# Patient Record
Sex: Male | Born: 1940 | Race: White | Hispanic: No | Marital: Married | Smoking: Current every day smoker
Health system: Western US, Community
[De-identification: ages and names within clinical notes are randomized; demographics above are authoritative.]

## PROBLEM LIST (undated history)

## (undated) DIAGNOSIS — H9190 Unspecified hearing loss, unspecified ear: Secondary | ICD-10-CM

## (undated) DIAGNOSIS — J449 Chronic obstructive pulmonary disease, unspecified: Secondary | ICD-10-CM

## (undated) HISTORY — PX: HERNIA REPAIR: SHX51

## (undated) HISTORY — PX: APPENDECTOMY: SHX54

## (undated) LAB — PREPARE RBC
Blood Product Blood Type Barcode: 5100
Blood Product Blood Type Barcode: 5100
Blood Product Blood Type Barcode: 5100
Blood Product Blood Type Barcode: 5100
Blood Product Unit ABORh: O POS
Blood Product Unit ABORh: O POS
Blood Product Unit ABORh: O POS
Blood Product Unit ABORh: O POS
Blood Product Unit Expiration: 201712142359
Blood Product Unit Expiration: 201712142359
Blood Product Unit Expiration: 201712202359
Blood Product Unit Expiration: 201712262359
Blood Product Unit Rh: POSITIVE
Blood Product Unit Rh: POSITIVE
Blood Product Unit Rh: POSITIVE
Blood Product Unit Rh: POSITIVE

---

## 2009-04-22 LAB — CBC WITHOUT DIFFERENTIAL
HCT: 54.1 % — ABNORMAL HIGH (ref 42.0–54.0)
Hgb: 18.2 gm/dL — ABNORMAL HIGH (ref 14.0–18.0)
MCH: 33.3 pg (ref 27.0–34.0)
MCHC: 33.7 gm/dL (ref 32.0–36.0)
MCV: 98.7 fL (ref 81.0–99.0)
MPV: 9.3 fL (ref 7.4–10.4)
Platelet Count: 131 10*3/uL — ABNORMAL LOW (ref 150–400)
RBC: 5.48 10*6/uL (ref 4.70–6.10)
RDW: 13.1 % (ref 11.5–14.5)
WBC: 15.5 10*3/uL — ABNORMAL HIGH (ref 4.8–10.8)

## 2009-04-22 LAB — BLOOD CULTURE
Culture Result: NO GROWTH
Culture Result: NO GROWTH
Report Status: 1092011
Report Status: 1092011

## 2009-04-22 LAB — COMPREHENSIVE METABOLIC PANEL
ALT - Alanine Amino transferase: 14 IU/L (ref 5–35)
AST - Aspartate Aminotransferase: 20 IU/L (ref 5–37)
Albumin/Globulin Ratio: 1.4 (ref >0.9)
Albumin: 4.2 gm/dL (ref 3.5–5.5)
Alkaline Phosphatase: 72 IU/L (ref 39–117)
Anion Gap: 15.3 mmol/L (ref 5–19)
BUN: 23 mg/dL (ref 6–26)
Bilirubin, Total: 2.7 mg/dL — ABNORMAL HIGH (ref 0.2–1.2)
CO2 - Carbon Dioxide: 23.1 mmol/L (ref 22–34)
Calcium: 8.9 mg/dL (ref 8.5–10.5)
Chloride: 99 mmol/L (ref 99–111)
Creatinine, Serum: 1.07 mg/dL (ref 0.60–1.30)
GFR Estimate: >60 mL/min/{1.73_m2} (ref >60)
Globulin: 2.9 gm/dL (ref 2.2–3.7)
Glucose: 107 mg/dL — ABNORMAL HIGH (ref 80–99)
Potassium: 3.4 mmol/L — ABNORMAL LOW (ref 3.5–5.3)
Protein, Total: 7.1 gm/dL (ref 6.0–8.0)
Sodium: 134 mmol/L — ABNORMAL LOW (ref 136–148)

## 2012-01-25 ENCOUNTER — Inpatient Hospital Stay: Payer: MEDICARE | Attending: MD | Primary: MD

## 2012-01-25 ENCOUNTER — Inpatient Hospital Stay: Admit: 2012-01-25 | Payer: MEDICARE | Attending: MD | Primary: MD

## 2012-01-25 DIAGNOSIS — M5137 Other intervertebral disc degeneration, lumbosacral region: Secondary | ICD-10-CM

## 2012-01-25 DIAGNOSIS — M5430 Sciatica, unspecified side: Secondary | ICD-10-CM

## 2012-05-11 ENCOUNTER — Inpatient Hospital Stay: Admit: 2012-05-11 | Payer: MEDICARE | Attending: MD | Primary: MD

## 2012-05-11 DIAGNOSIS — M5430 Sciatica, unspecified side: Secondary | ICD-10-CM

## 2013-05-16 ENCOUNTER — Emergency Department: Admit: 2013-05-17 | Payer: MEDICARE | Primary: MD

## 2013-05-16 DIAGNOSIS — M549 Dorsalgia, unspecified: Secondary | ICD-10-CM

## 2013-05-16 NOTE — ED Notes (Signed)
Pt informed of plan to inform md lab tests are back. Pt still pain free at this time.

## 2013-05-16 NOTE — ED Notes (Signed)
IV established, blood to lab, pt awaiting to be seen by MD

## 2013-05-16 NOTE — ED Provider Notes (Signed)
[facility] EMERGENCY DEPARTMENT PROVIDER NOTE      Name: Eduardo Khan  DOB: 06-09-1940 73 y.o.  MRN: [id number]  CSN: [id number]      HISTORY:     CHIEF COMPLAINT    Chief Complaint   Patient presents with   . Back Pain   . Numbness         HPI    Eduardo Khan is a 73 y.o. male with past medical history significant for hypertension, A. fib on Coumadin, continued tobacco abuse and COPD who presents with back pain.    The patient states that he was otherwise in his normal healthy baseline this evening. He was sitting watching TV when he had abrupt onset of subscapular left chest pain which was sharp in nature. It lasted approximately 15 minutes. He had associated radiation of numbness down his left arm. He did not have any associated shortness of breath, nausea, vomiting or diaphoresis. The pain went away after approximately 15 minutes without any intervention. This event occurred at 7:30 tonight. He has not had any recurrence of the symptoms. Here he feels normal.    Otherwise reports being at their normal baseline with no recent fevers, chills, coughs or colds.      PAST MEDICAL HISTORY    Past Medical History   Diagnosis Date   . A-fib    . COPD (chronic obstructive pulmonary disease)    . GERD (gastroesophageal reflux disease)    . Hypertension          SURGICAL HISTORY    History reviewed. No pertinent past surgical history.      CURRENT MEDICATIONS    Current Outpatient Rx   Name  Route  Sig  Dispense  Refill   . fluticasone-salmeterol (ADVAIR) 100-50 mcg/dose diskus inhaler    Inhalation    Inhale 1 puff into the lungs 2 (two) times daily.             Marland Kitchen ipratropium-albuterol (DUO-NEB) 0.5 mg-3 mg(2.5 mg base)/3 mL nebulizer solution    Nebulization    Take 3 mLs by nebulization every 6 (six) hours as needed for Wheezing.             . nadolol (CORGARD) 40 MG tablet    Oral    Take 40 mg by mouth daily.             Marland Kitchen OMEPRAZOLE ORAL    Oral    Take by mouth.             . potassium chloride  (KLOR-CON) 10 MEQ CR tablet    Oral    Take 10 mEq by mouth daily.             Marland Kitchen tiotropium (SPIRIVA) 18 mcg inhalation    Inhalation    Inhale 18 mcg into the lungs daily.             Marland Kitchen UNKNOWN TO PATIENT        Pt states  He takes water pill, unknown name.             . warfarin (COUMADIN) 5 MG tablet    Oral    Take 5 mg by mouth daily. 25mg  every other day                   ALLERGIES    Codeine and Morphine      FAMILY HISTORY    Reviewed and felt noncontributory the patient's current presentation.  SOCIAL HISTORY    History   Substance Use Topics   . Smoking status: Current Every Day Smoker -- 1.00 packs/day   . Smokeless tobacco: Not on file   . Alcohol Use: 0.6 oz/week     1 Cans of beer per week      Comment: occasion         REVIEW OF SYSTEMS:   10-pt Review of Systems was performed and is otherwise negative apart from that mentioned in HPI.      PHYSICAL EXAM:   VITAL SIGNS:  BP 113/88  Pulse 82  Temp(Src) 36.1 ?C (97 ?F) (Temporal)  Resp 18  SpO2 94%  Constitutional:  Well developed, Well nourished, No acute distress, Non-toxic appearance.   HENT:  Normocephalic, atraumatic.    Neck: Supple. Normal range of motion.    Eyes:  PERRL, EOMI, Conjunctiva normal, No discharge.   Respiratory:  Normal work of breathing. Normal breath sounds throughout. No wheezing, rhonchi or rales.   Cardiovascular:  Irregularly irregular rate and rhythm. No murmur, rubs, or gallop.   GI:  Abdomen is soft and non-distended. Non-tender. No rebound. No guarding.   Musculoskeletal:  Intact distal pulses. No tenderness. No edema.   Integument:  Warm. Dry. No erythema. No rash.   Neurologic:  Alert & oriented x 3, Normal motor function. Normal sensory function. No focal deficits noted.  Psychiatric:  Affect normal, Judgment normal, Mood normal.       ED COURSE & MEDICAL DECISION MAKING    In summary, Eduardo Khan is a 73 y.o. male with past medical history significant for hypertension, A. fib, continued tobacco abuse  and COPD who presents with back pain.    Our primary and secondary assessment reveals an awake, alert patient in no acute distress. Hemodynamically stable and afebrile. Exam reveals no focal findings.    Given the patient's history and exam, it is uncertain what caused his subscapular back pain earlier tonight. My initial concern was could this be cardiac in nature. His initial EKG was unremarkable. His initial troponin was also negative. He is having no hypoxia, tachypnea or other symptoms suggestive of pulmonary embolism. His history is also not suggestive of aortic dissection. A chest x-ray was obtained and shows no evidence of pneumonia, pneumothorax or other acute cardiopulmonary process. This does not appear to be a referred intra-abdominal pathology. The patient was asymptomatic on arrival and remained asymptomatic throughout his ED course. I elected to hold him here for a repeat troponin 4 hours after the symptoms resolved. That was again negative.    Given the patient's workup, feel he is safe for discharge. Have discussed with the patient results of workup, indications for return and need for PCP follow up. They understand and agree with the plan.       Labs:  All labs were reviewed prior to patient's disposition and notable for the following:  CBC unremarkable  CMP notable for a mild hypokalemia  Lipase negative  Troponin negative  Delta troponin negative    Imaging:  CXR: Obtained, reviewed and interpreted by myself shows no evidence of infiltrates, effusions or pneumothorax. Cardiac and mediastinal silhouette normal. No bony or soft tissue abnormalities.        Further Diagnostics:  12 Lead ECG Interpretation:  Rhythm:  Atrial fibrillation  Rate:  82  Axis:  Normal  QT interval:  406  ST segment changes:  No acute ischemic changes      IMPRESSION:  Subscapular back pain  PLAN, DISPOSITION AND FOLLOW-UP:   Discharged in stable condition  Followup PCP  Return precautions given        [name],  MD  05/17/13 [phone]

## 2013-05-16 NOTE — ED Notes (Signed)
Laying in bed when he had sudden subscapular pain on left, then arm went numb. Lasted 10-15 minutes , almost resolved now. HX COPD with peripheral cyanosis.

## 2013-05-17 ENCOUNTER — Inpatient Hospital Stay
Admit: 2013-05-17 | Discharge: 2013-05-17 | Disposition: A | Discharge status: Home / Self Care | Payer: MEDICARE | Attending: MD

## 2013-05-17 LAB — COMPREHENSIVE METABOLIC PANEL
ALT - Alanine Amino transferase: 21 IU/L (ref 5–35)
AST - Aspartate Aminotransferase: 22 IU/L (ref 5–37)
Albumin/Globulin Ratio: 1.5 (ref >0.9)
Albumin: 4.3 gm/dL (ref 3.5–5.5)
Alkaline Phosphatase: 83 IU/L (ref 39–117)
Anion Gap: 8.7 mmol/L (ref 3–11)
BUN: 20 mg/dL (ref 6–26)
Bilirubin, Total: 0.8 mg/dL (ref 0.2–1.2)
CO2 - Carbon Dioxide: 26.3 mmol/L (ref 22–34)
Calcium: 9 mg/dL (ref 8.5–10.5)
Chloride: 104 mmol/L (ref 99–111)
Creatinine, Serum: 0.9 mg/dL (ref 0.60–1.30)
GFR Estimate: >60 mL/min/{1.73_m2} (ref >60)
Globulin: 2.8 gm/dL (ref 2.2–3.7)
Glucose: 110 mg/dL — ABNORMAL HIGH (ref 80–99)
Potassium: 3.1 mmol/L — ABNORMAL LOW (ref 3.5–5.3)
Protein, Total: 7.1 gm/dL (ref 6.0–8.0)
Sodium: 139 mmol/L (ref 136–148)

## 2013-05-17 LAB — CBC WITH AUTO DIFFERENTIAL
Basophils %: 0 % (ref 0–2)
Basophils, Absolute: 0 10*3/uL (ref 0–0.2)
Eosinophils %: 3 % (ref 0–7)
Eosinophils, Absolute: 0.2 10*3/uL (ref 0–0.7)
HCT: 54.2 % — ABNORMAL HIGH (ref 42.0–54.0)
Hgb: 18.1 gm/dL — ABNORMAL HIGH (ref 14.0–18.0)
Lymphocytes %: 29 % (ref 25–45)
Lymphocytes, Absolute: 2.2 10*3/uL (ref 1.1–4.3)
MCH: 33.3 pg (ref 27.0–34.0)
MCHC: 33.5 gm/dL (ref 32.0–36.0)
MCV: 99.4 fL — ABNORMAL HIGH (ref 81.0–99.0)
MPV: 9 fL (ref 7.4–10.4)
Monocytes %: 8 % (ref 0–12)
Monocytes, Absolute: 0.6 10*3/uL (ref 0–1.2)
Neutrophils, Absolute: 4.5 10*3/uL (ref 1.6–7.3)
Platelet Count: 138 10*3/uL — ABNORMAL LOW (ref 150–400)
RBC: 5.45 10*6/uL (ref 4.70–6.10)
RDW: 13.6 % (ref 11.5–14.5)
Segs (Neutrophils)%: 60 % (ref 35–70)
WBC: 7.6 10*3/uL (ref 4.8–10.8)

## 2013-05-17 LAB — PROTIME-INR
INR: 2.3 — ABNORMAL HIGH (ref 0.9–1.1)
Protime: 28 s — ABNORMAL HIGH (ref 9.9–13.4)

## 2013-05-17 LAB — TROPONIN I
Troponin I: <0.02 ng/mL (ref 0.0–0.04)
Troponin I: <0.02 ng/mL (ref 0.0–0.04)

## 2013-05-17 LAB — LIPASE: Lipase: 24 U/L (ref 15–50)

## 2013-05-17 MED ORDER — aspirin 81 MG chewable tablet 324 mg
81 | Freq: Once | ORAL | Status: AC
Start: 2013-05-17 — End: 2013-05-16
  Administered 2013-05-17: 08:00:00 81 mg via ORAL

## 2013-05-17 MED FILL — ASPIRIN 81 MG CHEWABLE TABLET: 81 mg | ORAL | Qty: 4

## 2013-05-17 NOTE — ED Notes (Signed)
Pt resting in gurney watching tv with wife, awaiting 2nd trop draw at 0130

## 2013-05-17 NOTE — ED Notes (Signed)
Pt ambulated to restroom and back to room, pt aware of plan to repeat trop at 0130.

## 2013-05-17 NOTE — Discharge Instructions (Signed)
Thanks for coming to [facility] Emergency Department today. You were seen and evaluated for back pain. Please follow the below recommendations:    - Your chest xray, blood work and EKG did not reveal an evident cause of your back pain.    -  Although the specific cause of your back pain is not certain, it does not appear to be caused by your heart. That being said, sometimes serious causes of pain can present in unusual ways or can take time to develop.     - Please return to the ED if the pain changes - gets worse, spreads to your shoulders, jaw or neck or if you have other symptoms such as difficulty breathing, dizziness, sweating or you feel worse in any way. Otherwise, it is very important to be rechecked by the follow-up provider as we discussed.       Thank you.  Dorethea Clan, MD

## 2015-07-06 ENCOUNTER — Inpatient Hospital Stay
Admission: EM | Admit: 2015-07-06 | Discharge: 2015-07-09 | Disposition: A | Discharge status: Home / Self Care | Payer: MEDICARE | Admitting: MD

## 2015-07-06 ENCOUNTER — Emergency Department: Admit: 2015-07-06 | Payer: MEDICARE | Primary: MD

## 2015-07-06 DIAGNOSIS — J441 Chronic obstructive pulmonary disease with (acute) exacerbation: Principal | ICD-10-CM

## 2015-07-06 LAB — PT & PTT
APTT: 26 Seconds (ref 23–36)
INR: 1.3 — ABNORMAL HIGH (ref 0.9–1.2)
Protime: 15.6 Seconds — ABNORMAL HIGH (ref 9.9–13.4)

## 2015-07-06 LAB — COMPREHENSIVE METABOLIC PANEL
ALT - Alanine Aminotransferase: 20 IU/L (ref 7–52)
AST - Aspartate Aminotransferase: 28 IU/L (ref 10–50)
Albumin/Globulin Ratio: 1.7 (ref >0.9)
Albumin: 4.2 g/dL (ref 3.5–5.0)
Alkaline Phosphatase: 83 IU/L (ref 34–104)
Anion Gap: 10 mmol/L (ref 3.0–11.0)
BUN: 26 mg/dL — ABNORMAL HIGH (ref 6–23)
Bilirubin Total: 1 mg/dL (ref 0.3–1.2)
CO2 - Carbon Dioxide: 26 mmol/L (ref 21.0–31.0)
Calcium: 8.8 mg/dL (ref 8.6–10.3)
Chloride: 100 mmol/L (ref 98–111)
Creatinine: 0.89 mg/dL (ref 0.65–1.30)
GFR Estimate: >60 mL/min/{1.73_m2} (ref >=60)
Globulin: 2.5 g/dL (ref 2.2–3.7)
Glucose: 116 mg/dL — ABNORMAL HIGH (ref 80–99)
Potassium: 3.4 mmol/L — ABNORMAL LOW (ref 3.5–5.1)
Protein Total: 6.7 g/dL (ref 6.0–8.0)
Sodium: 136 mmol/L (ref 135–143)

## 2015-07-06 LAB — IRON AND TIBC
Iron Saturation: 7 % — ABNORMAL LOW (ref 18–54)
Iron: 22 ug/dL — ABNORMAL LOW (ref 40–160)
Total Iron Binding Capacity: 313 ug/dL (ref 261–471)
Transferrin: 219 mg/dL (ref 200–362)

## 2015-07-06 LAB — UA WITH AUTO MICRO
Ascorbic Acid, Urine: NEGATIVE
Bacteria, Urine: NONE SEEN /HPF
Bilirubin, Urine: NEGATIVE
Blood, Urine: NEGATIVE
Glucose, Urine: NEGATIVE mg/dL
Leukocyte Esterase, Urine: NEGATIVE
Nitrite, Urine: NEGATIVE
Protein, Urine: NEGATIVE mg/dL
RBC, Urine: <1 /HPF (ref <=5)
Specific Gravity, Urine: 1.019 (ref 1.003–1.030)
Squamous Epithelial, Urine: 0 /HPF (ref <=5)
Urobilinogen, Urine: NORMAL mg/dL
White Blood Cell Microscopic Numeric: <1 /HPF (ref <=9)
pH, UA: 5 (ref 5.0–7.5)

## 2015-07-06 LAB — CBC WITH AUTO DIFFERENTIAL
Basophils %: 1 % (ref 0–2)
Basophils, Absolute: 0 10*3/ÂµL (ref 0.0–0.2)
Eosinophils %: 0 % (ref 0–7)
Eosinophils, Absolute: 0 10*3/ÂµL (ref 0.0–0.7)
HCT: 53.4 % (ref 42.0–54.0)
Hemoglobin: 18.4 g/dL — ABNORMAL HIGH (ref 12.0–18.0)
Lymphocytes %: 9 % — ABNORMAL LOW (ref 25–45)
Lymphocytes, Absolute: 0.5 10*3/ÂµL — ABNORMAL LOW (ref 1.1–4.3)
MCH: 34.5 pg — ABNORMAL HIGH (ref 27.0–34.0)
MCHC: 34.5 g/dL (ref 32.0–36.0)
MCV: 100.1 fL — ABNORMAL HIGH (ref 81.0–99.0)
MPV: 9.4 fL (ref 7.4–10.4)
Monocytes %: 9 % (ref 0–12)
Monocytes, Absolute: 0.4 10*3/ÂµL (ref 0.0–1.2)
Neutrophils %: 81 % — ABNORMAL HIGH (ref 35–70)
Neutrophils, Absolute: 3.9 10*3/ÂµL (ref 1.6–7.3)
Platelet Count: 78 10*3/ÂµL — ABNORMAL LOW (ref 150–400)
RBC: 5.34 10*6/ÂµL (ref 4.70–6.10)
RDW: 13.8 % (ref 11.5–14.5)
WBC: 4.8 10*3/ÂµL (ref 4.8–10.8)

## 2015-07-06 LAB — BLOOD CULTURE
Blood Culture Result: NO GROWTH
Blood Culture Result: NO GROWTH

## 2015-07-06 LAB — LACTIC ACID ED: Lactic Acid ED: 2.1 mmol/L (ref 0.5–2.2)

## 2015-07-06 MED ORDER — sodium chloride (NS) 0.9% bolus 250 mL
Freq: Once | INTRAVENOUS | Status: AC
Start: 2015-07-06 — End: 2015-07-06
  Administered 2015-07-06 – 2015-07-07 (×2): via INTRAVENOUS

## 2015-07-06 MED ORDER — sodium chloride 0.9 % (NS) syringe 10 mL
INTRAMUSCULAR | Status: DC | PRN
Start: 2015-07-06 — End: 2015-07-09

## 2015-07-06 MED ORDER — azithromycin (ZITHROMAX) 500 mg in sodium chloride 0.9 % (NS) 250 mL IVPB
500 | Freq: Once | INTRAVENOUS | Status: AC
Start: 2015-07-06 — End: 2015-07-06
  Administered 2015-07-07 (×2): via INTRAVENOUS

## 2015-07-06 MED ORDER — acetaminophen (TYLENOL) tablet 650 mg
325 | Freq: Once | ORAL | Status: AC
Start: 2015-07-06 — End: 2015-07-06
  Administered 2015-07-06: 23:00:00 325 mg via ORAL

## 2015-07-06 MED ORDER — cefTRIAXone (ROCEPHIN) 1 g in sodium chloride 0.9 % (NS) 100 mL IVPB
1 | Freq: Once | INTRAMUSCULAR | Status: AC
Start: 2015-07-06 — End: 2015-07-06
  Administered 2015-07-07 (×2): via INTRAVENOUS

## 2015-07-06 MED FILL — SODIUM CHLORIDE 0.9 % INTRAVENOUS SOLUTION: 250.0000 mL | INTRAVENOUS | Qty: 250

## 2015-07-06 MED FILL — ACETAMINOPHEN 325 MG TABLET: 325 mg | ORAL | Qty: 2

## 2015-07-06 MED FILL — CEFTRIAXONE 1 GRAM SOLUTION FOR INJECTION: 1 g | INTRAMUSCULAR | Qty: 1

## 2015-07-06 NOTE — H&P (Signed)
History and Physical      Name: Eduardo Khan.  DOB: October 04, 1940 75 y.o.  MRN: [id number]  CSN: [id number]    History:     Chief Complaint:  Fatigue increasing over the last 3 - 4 days.    History of Present Illness:  Eduardo Khan. is a 75 y.o. male who presents for evaluation of urinary frequency, fatigue. Symptoms have been moderate. The symptoms have occurred over the last 1 days, and occur all day, lasting all day. The symptoms are aggravated by nothing and relieved by nothing.    Over the last 6 months pt has been losing weight, early satiety, fatigue. Over the last 3 days that fatigue has gotten worse and his chronic tremor has gotten worse as he has gotten weaker. Today he came in to the ED for frequent urination and was found to have a fever and what looks like a right middle lobe pneumonia. White count is normal and he has a mild polycythemia that looks chronic. His platelets are low.     Past Medical History:  Past Medical History:   Diagnosis Date   . A-fib (CMS/HCC)    . COPD (chronic obstructive pulmonary disease) (CMS/HCC)    . GERD (gastroesophageal reflux disease)    . Hypertension      Past Surgical History:  History reviewed. No pertinent surgical history.  Current Medications:  Prior to Admission Medications    Medication Dose & Frequency   fluticasone-salmeterol (ADVAIR) 100-50 mcg/dose diskus inhaler Inhale 1 puff into the lungs 2 (two) times daily.   ipratropium-albuterol (DUO-NEB) 0.5 mg-3 mg(2.5 mg base)/3 mL nebulizer solution Take 3 mLs by nebulization every 6 (six) hours as needed for Wheezing.   nadolol (CORGARD) 40 MG tablet Take 40 mg by mouth daily.   OMEPRAZOLE ORAL Take by mouth.   potassium chloride (KLOR-CON) 10 MEQ CR tablet Take 10 mEq by mouth daily.   tiotropium (SPIRIVA) 18 mcg inhalation Inhale 18 mcg into the lungs daily.   UNKNOWN TO PATIENT Pt states  He takes water pill, unknown name.   warfarin (COUMADIN) 5 MG tablet Take 5 mg by mouth daily. 25mg  every  other day        Allergies:  Venom-honey bee; Codeine; and Morphine    Family History:  History reviewed. No pertinent family history.  Social History:  Social History   Substance Use Topics   . Smoking status: Current Every Day Smoker     Packs/day: 1.00   . Smokeless tobacco: None   . Alcohol use 0.6 oz/week     1 Cans of beer per week      Comment: occasion     Immunization:    There is no immunization history on file for this patient.  Review of Systems:  Review of Systems   Constitutional: Positive for fever, malaise/fatigue and weight loss.   HENT: Negative.    Eyes: Negative.    Respiratory: Positive for shortness of breath and wheezing.    Cardiovascular: Negative.    Gastrointestinal: Negative.    Genitourinary: Positive for frequency.   Musculoskeletal: Negative.    Skin: Positive for rash.   Neurological: Positive for weakness.   Endo/Heme/Allergies: Negative.    Psychiatric/Behavioral: Negative.        Physical Exam:     Vital Signs:  Initial Vitals   BP 07/06/15 1706 106/58   Pulse 07/06/15 1611 79   Resp 07/06/15 1611 16   Temp 07/06/15 1554 37.6 ?  C (99.6 ?F)   SpO2 07/06/15 1611 92 %     Exam:  Physical Exam   Constitutional: He appears well-developed and well-nourished. No distress.   HENT:   Head: Normocephalic and atraumatic.   Mouth/Throat: Oropharynx is clear and moist.   Eyes: Conjunctivae are normal. Pupils are equal, round, and reactive to light. No scleral icterus.   Cardiovascular: Normal rate, normal heart sounds and intact distal pulses.    No murmur heard.  Pulmonary/Chest: Effort normal. No respiratory distress. He has rales.   Abdominal: Soft. Bowel sounds are normal. He exhibits no distension. There is no tenderness.   Musculoskeletal: Normal range of motion.   Neurological: He is alert.   Skin: Skin is warm and dry. He is not diaphoretic.   Psychiatric: He has a normal mood and affect. His behavior is normal.   Nursing note and vitals reviewed.      Data:     Labs:  Results for orders  placed or performed during the hospital encounter of 07/06/15 (from the past 24 hour(s))   Urinalysis with Microscopic -Once    Collection Time: 07/06/15  4:03 PM   Result Value Ref Range    Color, Urine Yellow Yellow, Straw    Clarity, Urine Clear Clear    Glucose, Urine Negative Negative mg/dL    Bilirubin, Urine Negative Negative    Ketones, Urine 1+ (A) Negative mg/dL    Specific Gravity, Urine 1.019 1.003 - 1.030    Blood, Urine Negative Negative    pH, UA 5.0 5.0 - 7.5    Protein, Urine Negative Negative mg/dL    Urobilinogen, Urine Normal Normal mg/dL    Nitrite, Urine Negative Negative    Leukocyte Esterase, Urine Negative Negative    Ascorbic Acid, Urine Negative Negative    White Blood Cell Microscopic Numeric <1 <=9 /HPF    RBC, Urine <1 <=5 /HPF    Bacteria, Urine None Seen None Seen /HPF    Squamous Epithelial, Urine 0 <=5 /HPF    Mucous, Urine 1+ 0 /LPF   Blood Culture -x 2    Collection Time: 07/06/15  4:16 PM   Result Value Ref Range    Blood Culture Result No Growth to Date    Blood Culture -x 2    Collection Time: 07/06/15  4:16 PM   Result Value Ref Range    Blood Culture Result No Growth to Date    CBC with Auto Differential -STAT    Collection Time: 07/06/15  4:16 PM   Result Value Ref Range    WBC 4.8 4.8 - 10.8 10*3/?L    RBC 5.34 4.70 - 6.10 10*6/?L    Hemoglobin 18.4 (H) 12.0 - 18.0 g/dL    HCT 16.1 09.6 - 04.5 %    MCV 100.1 (H) 81.0 - 99.0 fL    MCH 34.5 (H) 27.0 - 34.0 pg    MCHC 34.5 32.0 - 36.0 g/dL    RDW 40.9 81.1 - 91.4 %    Platelet Count 78 (L) 150 - 400 10*3/?L    MPV 9.4 7.4 - 10.4 fL    Neutrophils % 81 (H) 35 - 70 %    Lymphocytes % 9 (L) 25 - 45 %    Monocytes % 9 0 - 12 %    Eosinophils % 0 0 - 7 %    Basophils % 1 0 - 2 %    Neutrophils, Absolute 3.9 1.6 - 7.3 10*3/?L    Lymphocytes,  Absolute 0.5 (L) 1.1 - 4.3 10*3/?L    Monocytes, Absolute 0.4 0.0 - 1.2 10*3/?L    Eosinophils, Absolute 0.0 0.0 - 0.7 10*3/?L    Basophils, Absolute 0.0 0.0 - 0.2 10*3/?L   Comprehensive  Metabolic Panel -STAT    Collection Time: 07/06/15  4:16 PM   Result Value Ref Range    Sodium 136 135 - 143 mmol/L    Potassium 3.4 (L) 3.5 - 5.1 mmol/L    Chloride 100 98 - 111 mmol/L    CO2 - Carbon Dioxide 26.0 21.0 - 31.0 mmol/L    Glucose 116 (H) 80 - 99 mg/dL    BUN 26 (H) 6 - 23 mg/dL    Creatinine 1.61 0.96 - 1.30 mg/dL    Calcium 8.8 8.6 - 04.5 mg/dL    AST - Aspartate Aminotransferase 28 10 - 50 IU/L    ALT - Alanine Amino transferase 20 7 - 52 IU/L    Alkaline Phosphatase 83 34 - 104 IU/L    Bilirubin Total 1.0 0.3 - 1.2 mg/dL    Protein Total 6.7 6.0 - 8.0 g/dL    Albumin 4.2 3.5 - 5.0 g/dL    Globulin 2.5 2.2 - 3.7 g/dL    Albumin/Globulin Ratio 1.7 >0.9    Anion Gap 10.0 3.0 - 11.0 mmol/L    GFR Estimate >60 >=60 mL/min/1.3m*2    GFR Additional Info     Lactic Acid (ED) -STAT    Collection Time: 07/06/15  4:16 PM   Result Value Ref Range    Lactic Acid ED 2.1 0.5 - 2.2 mmol/L   PT & PTT -STAT    Collection Time: 07/06/15  4:16 PM   Result Value Ref Range    Protime 15.6 (H) 9.9 - 13.4 Seconds    APTT 26 23 - 36 Seconds    INR 1.3 (H) 0.9 - 1.2          Imaging for last 24 hours:  No results found.    Special Studies:  Chest x-ray    Assessment:     Diagnoses:  Active Hospital Problems    Diagnosis SNOMED CT(R) Date Noted   . Community acquired bacterial pneumonia COMMUNITY ACQUIRED PNEUMONIA 07/06/2015     Priority: High     Class: Acute   . COPD (chronic obstructive pulmonary disease) (CMS/HCC) CHRONIC OBSTRUCTIVE LUNG DISEASE 07/06/2015     Priority: Medium     Class: Chronic   . A-fib (CMS/HCC) ATRIAL FIBRILLATION 07/06/2015     Priority: Low     Class: Chronic   . CAP (community acquired pneumonia) COMMUNITY ACQUIRED PNEUMONIA 07/06/2015       Plan:   Admit, continue antibiotic coverage for community acquired pneumonia, add lovenox until INR is up to therapeutic, continue other home meds for COPD plus RRT meds for PRN nebs.     I will check an abdominal CT to look at the spleen and liver and try  to get a handle on this thrombocytopenia and chronic weight loss. I will also start on prednisone 60/d. Bladder scan was normal. UA was normal so urinary frequency remains mysterious.     I will discuss Mr. Bankson' case with Dr. Mayford Knife who will likely assume care tomorrow morning.     Leanord Asal  07/06/2015  5:30 PM

## 2015-07-06 NOTE — ED Provider Notes (Signed)
History   Alvin Diffee. is a 75 y.o. year old male presenting with Fatigue  .    HPI patient is 75 years old presents with severe fatigue frequency urgency.  This has been occurring over the last 24 hours.  , And the point now where he can't even really sit up let alone stand.  No one has been able to give him adequate help.  Patient has felt chilled today no nausea or vomiting no skin rash.  No headache or sore throat.  No focal neurologic changes just diffuse weakness and fatigue. Patient has a significant fever here 39.1?C.  Does have somewhat of a raspy cough nonproductive.  No abdominal pain no skin rash no headache or focal weakness no confusion. Rest medical history does include atrial fibrillation. And he is on anticoagulation for the.    Past Medical History:   Diagnosis Date   . A-fib    . COPD (chronic obstructive pulmonary disease)    . GERD (gastroesophageal reflux disease)    . Hypertension        No past surgical history on file.    No family history on file.    Social History   Substance Use Topics   . Smoking status: Current Every Day Smoker     Packs/day: 1.00   . Smokeless tobacco: Not on file   . Alcohol use 0.6 oz/week     1 Cans of beer per week      Comment: occasion     Other Social History Comments:       Patient's Medications   New Prescriptions    No medications on file   Home Medications Prior to ED Visit    FLUTICASONE-SALMETEROL (ADVAIR) 100-50 MCG/DOSE DISKUS INHALER    Inhale 1 puff into the lungs 2 (two) times daily.    IPRATROPIUM-ALBUTEROL (DUO-NEB) 0.5 MG-3 MG(2.5 MG BASE)/3 ML NEBULIZER SOLUTION    Take 3 mLs by nebulization every 6 (six) hours as needed for Wheezing.    NADOLOL (CORGARD) 40 MG TABLET    Take 40 mg by mouth daily.    OMEPRAZOLE ORAL    Take by mouth.    POTASSIUM CHLORIDE (KLOR-CON) 10 MEQ CR TABLET    Take 10 mEq by mouth daily.    TIOTROPIUM (SPIRIVA) 18 MCG INHALATION    Inhale 18 mcg into the lungs daily.    UNKNOWN TO PATIENT    Pt states  He  takes water pill, unknown name.    WARFARIN (COUMADIN) 5 MG TABLET    Take 5 mg by mouth daily. 25mg  every other day   Modified Medications    No medications on file   Discontinued Medications    No medications on file       Review of Systems full review of systems is obtained pertinent positives as in history of present illness    Physical Exam     Visit Vitals   . Pulse 79   . Temp (!) 39.1 ?C (102.4 ?F) (Oral)   . Resp 16   . SpO2 92%       Physical Exam alert oriented has a slight chronic tremor very conversant and pleasant but does appear ill  HEENT is negative oropharynx without signs of infection  Neck is supple there is no stiffness or difficulties  Lungs clear no wheezes or rales  Cardiovascular regular rate and rhythm no abnormalities  Abdomen is soft there is no tenderness  Lower extremities without significant  edema  In is without rash  Mentation intact he is conversant speech is fluent    ED Course     Results for orders placed or performed during the hospital encounter of 07/06/15 (from the past 24 hour(s))   Urinalysis with Microscopic -Once    Collection Time: 07/06/15  4:03 PM   Result Value Ref Range    Color, Urine Yellow Yellow, Straw    Clarity, Urine Clear Clear    Glucose, Urine Negative Negative mg/dL    Bilirubin, Urine Negative Negative    Ketones, Urine 1+ (A) Negative mg/dL    Specific Gravity, Urine 1.019 1.003 - 1.030    Blood, Urine Negative Negative    pH, UA 5.0 5.0 - 7.5    Protein, Urine Negative Negative mg/dL    Urobilinogen, Urine Normal Normal mg/dL    Nitrite, Urine Negative Negative    Leukocyte Esterase, Urine Negative Negative    Ascorbic Acid, Urine Negative Negative    White Blood Cell Microscopic Numeric <1 <=9 /HPF    RBC, Urine <1 <=5 /HPF    Bacteria, Urine None Seen None Seen /HPF    Squamous Epithelial, Urine 0 <=5 /HPF    Mucous, Urine 1+ 0 /LPF   Blood Culture -x 2    Collection Time: 07/06/15  4:16 PM   Result Value Ref Range    Blood Culture Result No Growth to  Date    Blood Culture -x 2    Collection Time: 07/06/15  4:16 PM   Result Value Ref Range    Blood Culture Result No Growth to Date    Comprehensive Metabolic Panel -STAT    Collection Time: 07/06/15  4:16 PM   Result Value Ref Range    Sodium 136 135 - 143 mmol/L    Potassium 3.4 (L) 3.5 - 5.1 mmol/L    Chloride 100 98 - 111 mmol/L    CO2 - Carbon Dioxide 26.0 21.0 - 31.0 mmol/L    Glucose 116 (H) 80 - 99 mg/dL    BUN 26 (H) 6 - 23 mg/dL    Creatinine 1.61 0.96 - 1.30 mg/dL    Calcium 8.8 8.6 - 04.5 mg/dL    AST - Aspartate Aminotransferase 28 10 - 50 IU/L    ALT - Alanine Amino transferase 20 7 - 52 IU/L    Alkaline Phosphatase 83 34 - 104 IU/L    Bilirubin Total 1.0 0.3 - 1.2 mg/dL    Protein Total 6.7 6.0 - 8.0 g/dL    Albumin 4.2 3.5 - 5.0 g/dL    Globulin 2.5 2.2 - 3.7 g/dL    Albumin/Globulin Ratio 1.7 >0.9    Anion Gap 10.0 3.0 - 11.0 mmol/L    GFR Estimate >60 >=60 mL/min/1.44m*2    GFR Additional Info     Lactic Acid (ED) -STAT    Collection Time: 07/06/15  4:16 PM   Result Value Ref Range    Lactic Acid ED 2.1 0.5 - 2.2 mmol/L   PT & PTT -STAT    Collection Time: 07/06/15  4:16 PM   Result Value Ref Range    Protime 15.6 (H) 9.9 - 13.4 Seconds    APTT 26 23 - 36 Seconds    INR 1.3 (H) 0.9 - 1.2       X-ray chest PA or AP    (Results Pending)       Procedures    MDM blood cultures are obtained labs are all as above and  reviewed.  Chest x-ray is consistent with pneumonia and the patient is given ceftriaxone and azithromycin for treatment of community-acquired pneumonia.  He will also be treated for his COPD.  Ongoing treatment for his atrial fibrillation.    ED Disposition: Admit    Diagnoses that have been ruled out:   None   Diagnoses that are still under consideration:   None   Final diagnoses:   None    pneumonia  Fever  Weakness     Kandice Robinsons, MD  07/07/15 [phone]

## 2015-07-06 NOTE — Plan of Care (Signed)
Patient just admitted, will continue to monitor and assess.

## 2015-07-06 NOTE — ED Notes (Signed)
Stable upon transport to the floor. Rocephin IV infused. of ns infused. Zithromax 500mg  is infusing at this time. Vss. And pt is in NAD.

## 2015-07-06 NOTE — ED Notes (Signed)
Bed: 501-01  Expected date:   Expected time:   Means of arrival:   Comments:  59M weakness  howie

## 2015-07-06 NOTE — ED Triage Notes (Signed)
Pt arrives via EMS from home secondary to increased weakness/fatigue with frequent urination. EMS temp 103.8 CBG 121

## 2015-07-07 ENCOUNTER — Inpatient Hospital Stay: Admit: 2015-07-07 | Payer: MEDICARE | Primary: MD

## 2015-07-07 LAB — CBC WITH AUTO DIFFERENTIAL
Basophils %: 0 % (ref 0–2)
Basophils, Absolute: 0 10*3/ÂµL (ref 0.0–0.2)
Eosinophils %: 0 % (ref 0–7)
Eosinophils, Absolute: 0 10*3/ÂµL (ref 0.0–0.7)
HCT: 45 % (ref 42.0–54.0)
Hemoglobin: 15.1 g/dL (ref 12.0–18.0)
Lymphocytes %: 5 % — ABNORMAL LOW (ref 25–45)
Lymphocytes, Absolute: 0.4 10*3/ÂµL — ABNORMAL LOW (ref 1.1–4.3)
MCH: 33.3 pg (ref 27.0–34.0)
MCHC: 33.7 g/dL (ref 32.0–36.0)
MCV: 99.1 fL — ABNORMAL HIGH (ref 81.0–99.0)
MPV: 9.5 fL (ref 7.4–10.4)
Monocytes %: 5 % (ref 0–12)
Monocytes, Absolute: 0.4 10*3/ÂµL (ref 0.0–1.2)
Neutrophils %: 89 % — ABNORMAL HIGH (ref 35–70)
Neutrophils, Absolute: 6.8 10*3/ÂµL (ref 1.6–7.3)
Platelet Count: 57 10*3/ÂµL — ABNORMAL LOW (ref 150–400)
RBC: 4.54 10*6/ÂµL — ABNORMAL LOW (ref 4.70–6.10)
RDW: 13.9 % (ref 11.5–14.5)
WBC: 7.6 10*3/ÂµL (ref 4.8–10.8)

## 2015-07-07 LAB — BASIC METABOLIC PANEL
Anion Gap: 7 mmol/L (ref 3.0–11.0)
BUN: 24 mg/dL — ABNORMAL HIGH (ref 6–23)
CO2 - Carbon Dioxide: 24 mmol/L (ref 21.0–31.0)
Calcium: 7.4 mg/dL — ABNORMAL LOW (ref 8.6–10.3)
Chloride: 107 mmol/L (ref 98–111)
Creatinine: 0.89 mg/dL (ref 0.65–1.30)
GFR Estimate: >60 mL/min/{1.73_m2} (ref >=60)
Glucose: 98 mg/dL (ref 80–99)
Potassium: 3.9 mmol/L (ref 3.5–5.1)
Sodium: 138 mmol/L (ref 135–143)

## 2015-07-07 LAB — PROTIME-INR
INR: 1.5 — ABNORMAL HIGH (ref 0.9–1.2)
Protime: 17.1 Seconds — ABNORMAL HIGH (ref 9.9–13.4)

## 2015-07-07 LAB — B-TYPE NATRIURETIC PEPTIDE: B-Type Natriuretic Peptide - BNP: 304 pg/mL — ABNORMAL HIGH (ref 0–99)

## 2015-07-07 MED ORDER — ketorolac (TORADOL) injection 15 mg
15 | Freq: Four times a day (QID) | INTRAMUSCULAR | Status: DC | PRN
Start: 2015-07-07 — End: 2015-07-09

## 2015-07-07 MED ORDER — L. acidophilus, L. bulgaricus (BACID) tablet 1 tablet
1 | Freq: Two times a day (BID) | ORAL | Status: DC
Start: 2015-07-07 — End: 2015-07-09
  Administered 2015-07-07 – 2015-07-09 (×6): 1 via ORAL

## 2015-07-07 MED ORDER — polyethylene glycol (MIRALAX) packet 17 g
17 | Freq: Every day | ORAL | Status: DC | PRN
Start: 2015-07-07 — End: 2015-07-09

## 2015-07-07 MED ORDER — midodrine (PROAMATINE) tablet 10 mg
2.5 | Freq: Three times a day (TID) | ORAL | Status: DC | PRN
Start: 2015-07-07 — End: 2015-07-09
  Administered 2015-07-07: 10:00:00 2.5 mg via ORAL

## 2015-07-07 MED ORDER — budesonide (PULMICORT) nebulizer suspension 0.5 mg
0.5 | Freq: Two times a day (BID) | RESPIRATORY_TRACT | Status: DC
Start: 2015-07-07 — End: 2015-07-09
  Administered 2015-07-07 – 2015-07-09 (×6): 0.5 mg via RESPIRATORY_TRACT

## 2015-07-07 MED ORDER — ipratropium-albuterol (DUO-NEB) 0.5 mg-3 mg(2.5 mg base)/3 mL nebulizer solution 3 mL
0.5 | RESPIRATORY_TRACT | Status: DC | PRN
Start: 2015-07-07 — End: 2015-07-09

## 2015-07-07 MED ORDER — arformoterol (BROVANA) nebulizer solution 15 mcg
15 | Freq: Two times a day (BID) | RESPIRATORY_TRACT | Status: DC
Start: 2015-07-07 — End: 2015-07-09
  Administered 2015-07-07 – 2015-07-09 (×6): 15 ug via RESPIRATORY_TRACT

## 2015-07-07 MED ORDER — acetaminophen (TYLENOL) tablet 650 mg
325 | ORAL | Status: DC | PRN
Start: 2015-07-07 — End: 2015-07-09
  Administered 2015-07-07: 04:00:00 325 mg via ORAL

## 2015-07-07 MED ORDER — nicotine (NICODERM CQ) 21 mg/24 hr patch 1 patch
21 | Freq: Every day | TRANSDERMAL | Status: DC
Start: 2015-07-07 — End: 2015-07-09
  Administered 2015-07-07 – 2015-07-08 (×3): 21 via TRANSDERMAL

## 2015-07-07 MED ORDER — ondansetron (ZOFRAN) injection 4 mg
4 | Freq: Four times a day (QID) | INTRAMUSCULAR | Status: DC | PRN
Start: 2015-07-07 — End: 2015-07-09

## 2015-07-07 MED ORDER — naloxone (NARCAN) injection 0.08 mg
0.4 | INTRAMUSCULAR | Status: DC | PRN
Start: 2015-07-07 — End: 2015-07-09

## 2015-07-07 MED ORDER — diatrizoate meglumine-sodium (GASTROGRAFIN) 66-10 % oral solution 20 mL
66-10 | Freq: Once | ORAL | Status: AC
Start: 2015-07-07 — End: 2015-07-07
  Administered 2015-07-07: 15:00:00 66-10 mL via ORAL

## 2015-07-07 MED ORDER — HYDROmorphone (DILAUDID) syringe 0.5-1 mg
1 | INTRAMUSCULAR | Status: DC | PRN
Start: 2015-07-07 — End: 2015-07-09

## 2015-07-07 MED ORDER — iohexol (OMNIPAQUE) 350 mg iodine/mL injection 100 mL
350 | Freq: Once | INTRAVENOUS | Status: AC | PRN
Start: 2015-07-07 — End: 2015-07-07
  Administered 2015-07-07: 17:00:00 350 mL via INTRAVENOUS

## 2015-07-07 MED ORDER — predniSONE (DELTASONE) tablet 20 mg
20 | Freq: Every day | ORAL | Status: DC
Start: 2015-07-07 — End: 2015-07-08
  Administered 2015-07-07 – 2015-07-08 (×3): 20 mg via ORAL

## 2015-07-07 MED ORDER — fluticasone-salmeterol (ADVAIR) 100-50 mcg/dose diskus inhaler 1 puff
100-50 | Freq: Two times a day (BID) | RESPIRATORY_TRACT | Status: DC
Start: 2015-07-07 — End: 2015-07-06

## 2015-07-07 MED ORDER — sodium chloride 0.9 % infusion
INTRAVENOUS | Status: DC
Start: 2015-07-07 — End: 2015-07-07

## 2015-07-07 MED ORDER — warfarin (COUMADIN) tablet 5 mg
5 | Freq: Every day | ORAL | Status: DC
Start: 2015-07-07 — End: 2015-07-09
  Administered 2015-07-07 – 2015-07-09 (×3): 5 mg via ORAL

## 2015-07-07 MED ORDER — sodium chloride 0.9 % with KCl 20 mEq/L infusion
20 | INTRAVENOUS | Status: DC
Start: 2015-07-07 — End: 2015-07-08
  Administered 2015-07-07 – 2015-07-08 (×5): 20 mEq/L via INTRAVENOUS

## 2015-07-07 MED ORDER — enoxaparin (LOVENOX) syringe 40 mg
40 | Freq: Every day | SUBCUTANEOUS | Status: DC
Start: 2015-07-07 — End: 2015-07-07
  Administered 2015-07-07: 03:00:00 40 mg via SUBCUTANEOUS

## 2015-07-07 MED ORDER — ipratropium-albuterol (DUO-NEB) 0.5 mg-3 mg(2.5 mg base)/3 mL nebulizer solution 3 mL
0.5 | RESPIRATORY_TRACT | Status: DC
Start: 2015-07-07 — End: 2015-07-07
  Administered 2015-07-07 (×4): 0.5 mL via RESPIRATORY_TRACT

## 2015-07-07 MED ORDER — tiotropium (SPIRIVA) inhalation 18 mcg
18 | Freq: Every day | RESPIRATORY_TRACT | Status: DC
Start: 2015-07-07 — End: 2015-07-06

## 2015-07-07 MED ORDER — sodium chloride (NS) 0.9% bolus 500 mL
Freq: Once | INTRAVENOUS | Status: AC
Start: 2015-07-07 — End: 2015-07-07
  Administered 2015-07-07 (×2): via INTRAVENOUS

## 2015-07-07 MED ORDER — cefTRIAXone (ROCEPHIN) 1 g in D5W IVPB Premix
1 | INTRAVENOUS | Status: DC
Start: 2015-07-07 — End: 2015-07-09
  Administered 2015-07-08: 01:00:00 150 g via INTRAVENOUS
  Administered 2015-07-08: 01:00:00 1 g via INTRAVENOUS
  Administered 2015-07-09: 01:00:00 150 g via INTRAVENOUS
  Administered 2015-07-09: 1 g via INTRAVENOUS

## 2015-07-07 MED ORDER — nitroglycerin (NITROSTAT) SL tablet 0.4 mg
0.4 | SUBLINGUAL | Status: DC | PRN
Start: 2015-07-07 — End: 2015-07-09

## 2015-07-07 MED ORDER — albuterol (PROVENTIL) nebulizer solution 2.5 mg
2.5 | RESPIRATORY_TRACT | Status: DC | PRN
Start: 2015-07-07 — End: 2015-07-07

## 2015-07-07 MED ORDER — potassium chloride SA (K-DUR,KLOR-CON) CR tablet 10 mEq
10 | Freq: Every day | ORAL | Status: DC
Start: 2015-07-07 — End: 2015-07-09
  Administered 2015-07-07 – 2015-07-09 (×4): 10 meq via ORAL

## 2015-07-07 MED ORDER — phenol (CHLORASEPTIC) 1.4 % throat spray 1-2 spray
1.4 | Status: DC | PRN
Start: 2015-07-07 — End: 2015-07-09

## 2015-07-07 MED ORDER — sodium chloride (NS) 0.9% bolus 500 mL
Freq: Once | INTRAVENOUS | Status: AC
Start: 2015-07-07 — End: 2015-07-07
  Administered 2015-07-07 (×2): via INTRAVENOUS

## 2015-07-07 MED ORDER — ciclopirox (LOPROX) 0.77 % cream 15 g
0.77 | Freq: Two times a day (BID) | TOPICAL | Status: DC
Start: 2015-07-07 — End: 2015-07-09
  Administered 2015-07-08 (×2): 0.77 g via TOPICAL

## 2015-07-07 MED ORDER — enoxaparin (LOVENOX) syringe 40 mg
40 | Freq: Every day | SUBCUTANEOUS | Status: DC
Start: 2015-07-07 — End: 2015-07-06

## 2015-07-07 MED ORDER — sodium chloride (NS) 0.9% bolus 1,000 mL
Freq: Once | INTRAVENOUS | Status: AC
Start: 2015-07-07 — End: 2015-07-07
  Administered 2015-07-07 (×2): via INTRAVENOUS

## 2015-07-07 MED ORDER — diatrizoate meglumine-sodium (GASTROGRAFIN) 66-10 % oral solution 10 mL
66-10 | Freq: Once | ORAL | Status: AC
Start: 2015-07-07 — End: 2015-07-06
  Administered 2015-07-07: 03:00:00 66-10 mL via ORAL

## 2015-07-07 MED ORDER — metoprolol succinate (TOPROL-XL) 24 hr tablet 50 mg
50 | Freq: Every day | ORAL | Status: DC
Start: 2015-07-07 — End: 2015-07-09
  Administered 2015-07-07 – 2015-07-09 (×2): 50 mg via ORAL

## 2015-07-07 MED ORDER — omeprazole (PriLOSEC) capsule 20 mg
20 | Freq: Every day | ORAL | Status: DC
Start: 2015-07-07 — End: 2015-07-09
  Administered 2015-07-07 – 2015-07-09 (×4): 20 mg via ORAL

## 2015-07-07 MED ORDER — azithromycin (ZITHROMAX) tablet 500 mg
250 | Freq: Every day | ORAL | Status: DC
Start: 2015-07-07 — End: 2015-07-09
  Administered 2015-07-08 – 2015-07-09 (×2): 250 mg via ORAL

## 2015-07-07 MED FILL — PREDNISONE 20 MG TABLET: 20 mg | ORAL | Qty: 1

## 2015-07-07 MED FILL — POTASSIUM CHLORIDE ER 10 MEQ TABLET,EXTENDED RELEASE(PART/CRYST): 10 meq | ORAL | Qty: 1

## 2015-07-07 MED FILL — OMEPRAZOLE 20 MG CAPSULE,DELAYED RELEASE: 20 mg | ORAL | Qty: 1

## 2015-07-07 MED FILL — ADVAIR DISKUS 100 MCG-50 MCG/DOSE POWDER FOR INHALATION: 100-50 mcg/dose | RESPIRATORY_TRACT | Qty: 14

## 2015-07-07 MED FILL — BACID 1 BILLION CELL-250 MG TABLET: 1 billion cell- 250 mg | ORAL | Qty: 1

## 2015-07-07 MED FILL — SODIUM CHLORIDE 0.9 % INTRAVENOUS SOLUTION: INTRAVENOUS | Qty: 1000

## 2015-07-07 MED FILL — BROVANA 15 MCG/2 ML SOLUTION FOR NEBULIZATION: 15 mcg/2 mL | RESPIRATORY_TRACT | Qty: 2

## 2015-07-07 MED FILL — IPRATROPIUM 0.5 MG-ALBUTEROL 3 MG (2.5 MG BASE)/3 ML NEBULIZATION SOLN: 0.5 mg-3 mg(2.5 mg base)/3 mL | RESPIRATORY_TRACT | Qty: 1

## 2015-07-07 MED FILL — BUDESONIDE 0.5 MG/2 ML SUSPENSION FOR NEBULIZATION: 0.5 | RESPIRATORY_TRACT | Qty: 2

## 2015-07-07 MED FILL — METOPROLOL SUCCINATE ER 50 MG TABLET,EXTENDED RELEASE 24 HR: 50 mg | ORAL | Qty: 1

## 2015-07-07 MED FILL — SODIUM CHLORIDE 0.9 % W/ POTASSIUM CHLORIDE 20 MEQ/L INTRAVENOUS SOLUTION: 20 meq/L | INTRAVENOUS | Qty: 1000

## 2015-07-07 MED FILL — COUMADIN 5 MG TABLET: 5 mg | ORAL | Qty: 1

## 2015-07-07 MED FILL — MIDODRINE 2.5 MG TABLET: 2.5 mg | ORAL | Qty: 4

## 2015-07-07 MED FILL — CICLOPIROX 0.77 % TOPICAL CREAM: 0.77 % | TOPICAL | Qty: 15

## 2015-07-07 MED FILL — SPIRIVA WITH HANDIHALER 18 MCG AND INHALATION CAPSULES: 18 ug | RESPIRATORY_TRACT | Qty: 5

## 2015-07-07 MED FILL — ENOXAPARIN 40 MG/0.4 ML SUBCUTANEOUS SYRINGE: 40 | SUBCUTANEOUS | Qty: 1

## 2015-07-07 MED FILL — SODIUM CHLORIDE 0.9 % INTRAVENOUS SOLUTION: 1000.0000 mL | INTRAVENOUS | Qty: 1000

## 2015-07-07 MED FILL — AZITHROMYCIN 500 MG INTRAVENOUS SOLUTION: 500 mg | INTRAVENOUS | Qty: 500

## 2015-07-07 MED FILL — NICOTINE 21 MG/24 HR DAILY TRANSDERMAL PATCH: 21 mg/24 hr | TRANSDERMAL | Qty: 1

## 2015-07-07 MED FILL — ACETAMINOPHEN 325 MG TABLET: 325 mg | ORAL | Qty: 2

## 2015-07-07 NOTE — Plan of Care (Signed)
Patient has improved vital signs this shift.  Patient receiving IV fluids and medications per orders.  Patient continues to rest and benefit from oxygen to maintain work of breathing and respiratory effort.  Patient taking adequate PO nutrition at this time.  Will continue to monitor and assess.

## 2015-07-07 NOTE — Plan of Care (Signed)
Problem: Cardiovascular - Adult  Goal: Absence of cardiac dysrhythmias or at baseline  INTERVENTIONS:  1. Continuous cardiac monitoring, monitor vital signs, obtain 12 lead EKG if indicated  2. Administer antiarrhythmic and heart rate control medications as ordered  3. Initiate emergency measures for life threatening arrhythmias  4. Monitor electrolytes and administer replacement therapy as ordered   Outcome: Not Progressing  Pt remains in a fib with rare PVCs    Problem: Gastrointestinal - Adult  Goal: Maintains adequate nutritional intake  INTERVENTIONS:  1. Monitor percentage of each meal consumed  2. Identify factors contributing to decreased intake, treat as appropriate  3. Assist with meals as needed  4. Monitor I&O, WT and lab values  5. Obtain nutritional consult as needed   Outcome: Not Progressing  Pt having loss of appetite    Problem: Musculoskeletal - Adult  Goal: Return mobility to safest level of function  INTERVENTIONS:  1. Assess patient stability and activity tolerance for standing, transferring and ambulating w/ or w/o assistive devices  2. Assist with transfers and ambulation using safe patient handling equipment as needed  3. Ensure adequate protection for wounds/incisions during mobilization  4. Obtain PT/OT consults as needed  5. Apply Continuous Passive Motion per provider or PT orders to increase flexion toward goal  6. Instruct patient/family in ordered activity level   Outcome: Not Progressing  Pt incredibly weak, bedrest

## 2015-07-07 NOTE — Progress Notes (Signed)
INITIAL RESPIRATORY ASSESSMENT    SUBJECTIVE:  Pt admitted for Community acquired bacterial PNA.  Pt and chart state pt has COPD and  uses Spiriva / Advair and Duonebs. Pt is a current smoker, declined handouts, nicotine patch ordered.    OBJECTIVE:  Current Vital Signs:   C/S:  Diminished   HR: 81    RR:  18   SpO2:  92    Oxygenation Status: 3L NC   Cough: loose NPC       CXR:  ?07/06/2015  IMPRESSION:   ?  Mild increased bibasilar opacities, favored to represent subsegmental   atelectasis. Pneumonia may also be considered, particularly if the patient is   febrile.  ?   Home regimen:   Tobacco use: Current 1ppd    ASSESSMENT:  Pt lethargic, shallow breathing. Will start 24 hour bronchodilator protocol. Pt inhalers will be changed to nebs per RT conversion protocol. Pt would benefit from an Acapella and O2 to increase aeration to lung bases.     PLAN:  O2 protocol  Duoneb x 24 hours  Brovana BID  Pulmicort BID  Albuterol neb Q2 PRN for SOB/WHZ  Acapella  Reassess in 24 hours          GOAL(S):  Medication:   Maintain a patent airway  Mobilize secretions  Decrease work of breathing  Minimize or eliminate adventitious breath sounds  Maximize airflow    Pulmonary/Bronchial Hygiene:  Maintain a patent airway  Improve mucocilliary clearance  Prevent or correct atelectasis      Supplemental Oxygen:   Maintain SpO2 >90%  Normalize and maintain an adequate SpO2/PaO2 appropriate for patient clinical situation    E.S.  Eduardo Khan

## 2015-07-07 NOTE — Progress Notes (Signed)
History  FU visit for Community acquired bacterial pneumonia  Reviewed chart, labs  He is feeling a bit better this morning.     ROS  Const: no fevers since last night .    Medications  . arformoterol  15 mcg Nebulization 2 times daily   . cefTRIAXone (ROCEPHIN) IV  1 g Intravenous Q24H    And   . azithromycin  500 mg Oral Daily   . budesonide  0.5 mg Nebulization Q12H   . diatrizoate meglumine-sodium  20 mL Oral Once   . enoxaparin (LOVENOX) injection  40 mg Subcutaneous Daily   . ipratropium-albuterol  3 mL Nebulization Q4H   . L. acidophilus, L. bulgaricus  1 tablet Oral 2 times per day   . metoprolol succinate  50 mg Oral Daily   . nicotine  1 patch Transdermal Daily   . omeprazole  20 mg Oral Daily   . potassium chloride SA  10 mEq Oral Daily   . predniSONE  20 mg Oral Daily   . warfarin  5 mg Oral Daily     acetaminophen, albuterol, hydromorphone, ketorolac, midodrine, naloxone (NARCAN) injection 0.08 mg/2 mL, nitroglycerin, ondansetron, phenol, polyethylene glycol, sodium chloride 0.9 % (NS) syringe    Exam  Vitals:   Vitals:    07/07/15 0721   BP: 112/67   Pulse: 76   Resp: 20   Temp: 37.4 ?C (99.4 ?F)   SpO2: 93%     Last 24 hr vital signs reviewed  I/O last 3 completed shifts:  In: 3272 [I.V.:1922; IV Piggyback:1350]  Out: 245 [Urine:245]       Const: alert, NAD, tired appearing.  Lungs: few crackles at right base, mildly inc effort, which is chronic for him.   Heart: irreg, no murmur  Abd: soft, nontender, nondistended, no organomegaly, no masses  Ext: no edema    Results for orders placed or performed during the hospital encounter of 07/06/15 (from the past 24 hour(s))   Urinalysis with Microscopic -Once    Collection Time: 07/06/15  4:03 PM   Result Value Ref Range    Color, Urine Yellow Yellow, Straw    Clarity, Urine Clear Clear    Glucose, Urine Negative Negative mg/dL    Bilirubin, Urine Negative Negative    Ketones, Urine 1+ (A) Negative mg/dL    Specific Gravity, Urine 1.019 1.003 - 1.030    Blood, Urine Negative Negative    pH, UA 5.0 5.0 - 7.5    Protein, Urine Negative Negative mg/dL    Urobilinogen, Urine Normal Normal mg/dL    Nitrite, Urine Negative Negative    Leukocyte Esterase, Urine Negative Negative    Ascorbic Acid, Urine Negative Negative    White Blood Cell Microscopic Numeric <1 <=9 /HPF    RBC, Urine <1 <=5 /HPF    Bacteria, Urine None Seen None Seen /HPF    Squamous Epithelial, Urine 0 <=5 /HPF    Mucous, Urine 1+ 0 /LPF   Blood Culture -x 2    Collection Time: 07/06/15  4:16 PM   Result Value Ref Range    Blood Culture Result No Growth to Date    Blood Culture -x 2    Collection Time: 07/06/15  4:16 PM   Result Value Ref Range    Blood Culture Result No Growth to Date    CBC with Auto Differential -STAT    Collection Time: 07/06/15  4:16 PM   Result Value Ref Range    WBC 4.8  4.8 - 10.8 10*3/?L    RBC 5.34 4.70 - 6.10 10*6/?L    Hemoglobin 18.4 (H) 12.0 - 18.0 g/dL    HCT 16.1 09.6 - 04.5 %    MCV 100.1 (H) 81.0 - 99.0 fL    MCH 34.5 (H) 27.0 - 34.0 pg    MCHC 34.5 32.0 - 36.0 g/dL    RDW 40.9 81.1 - 91.4 %    Platelet Count 78 (L) 150 - 400 10*3/?L    MPV 9.4 7.4 - 10.4 fL    Neutrophils % 81 (H) 35 - 70 %    Lymphocytes % 9 (L) 25 - 45 %    Monocytes % 9 0 - 12 %    Eosinophils % 0 0 - 7 %    Basophils % 1 0 - 2 %    Neutrophils, Absolute 3.9 1.6 - 7.3 10*3/?L    Lymphocytes, Absolute 0.5 (L) 1.1 - 4.3 10*3/?L    Monocytes, Absolute 0.4 0.0 - 1.2 10*3/?L    Eosinophils, Absolute 0.0 0.0 - 0.7 10*3/?L    Basophils, Absolute 0.0 0.0 - 0.2 10*3/?L   Comprehensive Metabolic Panel -STAT    Collection Time: 07/06/15  4:16 PM   Result Value Ref Range    Sodium 136 135 - 143 mmol/L    Potassium 3.4 (L) 3.5 - 5.1 mmol/L    Chloride 100 98 - 111 mmol/L    CO2 - Carbon Dioxide 26.0 21.0 - 31.0 mmol/L    Glucose 116 (H) 80 - 99 mg/dL    BUN 26 (H) 6 - 23 mg/dL    Creatinine 7.82 9.56 - 1.30 mg/dL    Calcium 8.8 8.6 - 21.3 mg/dL    AST - Aspartate Aminotransferase 28 10 - 50 IU/L    ALT - Alanine  Amino transferase 20 7 - 52 IU/L    Alkaline Phosphatase 83 34 - 104 IU/L    Bilirubin Total 1.0 0.3 - 1.2 mg/dL    Protein Total 6.7 6.0 - 8.0 g/dL    Albumin 4.2 3.5 - 5.0 g/dL    Globulin 2.5 2.2 - 3.7 g/dL    Albumin/Globulin Ratio 1.7 >0.9    Anion Gap 10.0 3.0 - 11.0 mmol/L    GFR Estimate >60 >=60 mL/min/1.8m*2    GFR Additional Info     Lactic Acid (ED) -STAT    Collection Time: 07/06/15  4:16 PM   Result Value Ref Range    Lactic Acid ED 2.1 0.5 - 2.2 mmol/L   PT & PTT -STAT    Collection Time: 07/06/15  4:16 PM   Result Value Ref Range    Protime 15.6 (H) 9.9 - 13.4 Seconds    APTT 26 23 - 36 Seconds    INR 1.3 (H) 0.9 - 1.2   Iron & Total Iron Binding Capacity -Next Routine    Collection Time: 07/06/15  4:16 PM   Result Value Ref Range    Iron 22 (L) 40 - 160 ?g/dL    Iron Saturation 7 (L) 18 - 54 %    Transferrin 219 200 - 362 mg/dL    Total Iron Binding Capacity 313 261 - 471 ?g/dL   CBC with Auto Differential -AM Draw    Collection Time: 07/07/15  5:44 AM   Result Value Ref Range    WBC 7.6 4.8 - 10.8 10*3/?L    RBC 4.54 (L) 4.70 - 6.10 10*6/?L    Hemoglobin 15.1 12.0 - 18.0 g/dL  HCT 45.0 42.0 - 54.0 %    MCV 99.1 (H) 81.0 - 99.0 fL    MCH 33.3 27.0 - 34.0 pg    MCHC 33.7 32.0 - 36.0 g/dL    RDW 42.5 95.6 - 38.7 %    Platelet Count 57 (L) 150 - 400 10*3/?L    MPV 9.5 7.4 - 10.4 fL    Neutrophils % 89 (H) 35 - 70 %    Lymphocytes % 5 (L) 25 - 45 %    Monocytes % 5 0 - 12 %    Eosinophils % 0 0 - 7 %    Basophils % 0 0 - 2 %    Neutrophils, Absolute 6.8 1.6 - 7.3 10*3/?L    Lymphocytes, Absolute 0.4 (L) 1.1 - 4.3 10*3/?L    Monocytes, Absolute 0.4 0.0 - 1.2 10*3/?L    Eosinophils, Absolute 0.0 0.0 - 0.7 10*3/?L    Basophils, Absolute 0.0 0.0 - 0.2 10*3/?L    Differential Type Automated Differential    Basic Metabolic Panel -AM Draw    Collection Time: 07/07/15  5:44 AM   Result Value Ref Range    Sodium 138 135 - 143 mmol/L    Potassium 3.9 3.5 - 5.1 mmol/L    Chloride 107 98 - 111 mmol/L    CO2 -  Carbon Dioxide 24.0 21.0 - 31.0 mmol/L    Glucose 98 80 - 99 mg/dL    BUN 24 (H) 6 - 23 mg/dL    Creatinine 5.64 3.32 - 1.30 mg/dL    Calcium 7.4 (L) 8.6 - 10.3 mg/dL    Anion Gap 7.0 3.0 - 11.0 mmol/L    GFR Estimate >60 >=60 mL/min/1.50m*2    GFR Additional Info     Protime Panel -AM Draw    Collection Time: 07/07/15  5:44 AM   Result Value Ref Range    Protime 17.1 (H) 9.9 - 13.4 Seconds    INR 1.5 (H) 0.9 - 1.2   B-Type Natriuretic Peptide (BNP) -AM Draw    Collection Time: 07/07/15  5:44 AM   Result Value Ref Range    B-Type Natriuretic Peptide (BNP) 304 (H) 0 - 99 pg/mL       Assessment and Plan  Active Hospital Problems    *Community acquired bacterial pneumonia   On abx, blood culture pending.     COPD with exacerbation (CMS/HCC)   On nebs and steroids    Thrombocytopenia (CMS/HCC)   He has had chronic mild low plt before, but currently lower. CT abd pending for today.     A-fib (CMS/HCC)   On warfarin, INR is low, so lovenox has been added at low dosage, which is reasonable given the low plt.        Length of stay : 1 days      Samarra Ridgely A. Mayford Knife, M.D.

## 2015-07-07 NOTE — Progress Notes (Signed)
RESPIRATORY ASSESSMENT    SUBJECTIVE: Pt admitted with pneumonia, he has COPD, and is a current smoker.       OBJECTIVE: CAP (community acquired pneumonia)   Current Vital Signs:   C/S:  Diminished throughout.   HR:  77   RR:  14   SpO2:  93%    Oxygenation Status: 4 lpm   Home regimen:    Oxygen: none    Respiratory Medication: Duoneb, advair, spiriva.    Tobacco use: current everyday smoker.    ASSESSMENT: Pt has not had a significant improvement in lung sounds following tx's, he is able to call/notify staff if he needs a bronchodilator.       PLAN: Change DuoNeb to Q4 prn wheeze/sob.   Discontinue Albuterol.   Continue oxygen, wean as tolerated.   Reassess Q72 hours and prn.       GOAL(S):  Medication:   Decrease work of breathing  Minimize or eliminate adventitious breath sounds  Maximize airflow    Supplemental Oxygen:   Maintain SpO2 >90%  Normalize and maintain an adequate SpO2/PaO2 appropriate for patient clinical situation    E.S.  Zakkiyya Barno J. Lovell Nuttall RRT/BSRC, 07/07/2015

## 2015-07-08 LAB — CBC WITH AUTO DIFFERENTIAL
Basophils %: 0 % (ref 0–2)
Basophils, Absolute: 0 10*3/ÂµL (ref 0.0–0.2)
Eosinophils %: 0 % (ref 0–7)
Eosinophils, Absolute: 0 10*3/ÂµL (ref 0.0–0.7)
HCT: 46.1 % (ref 42.0–54.0)
Hemoglobin: 15.3 g/dL (ref 12.0–18.0)
Lymphocytes %: 7 % — ABNORMAL LOW (ref 25–45)
Lymphocytes, Absolute: 0.8 10*3/ÂµL — ABNORMAL LOW (ref 1.1–4.3)
MCH: 33.4 pg (ref 27.0–34.0)
MCHC: 33.3 g/dL (ref 32.0–36.0)
MCV: 100.5 fL — ABNORMAL HIGH (ref 81.0–99.0)
MPV: 9.6 fL (ref 7.4–10.4)
Monocytes %: 4 % (ref 0–12)
Monocytes, Absolute: 0.5 10*3/ÂµL (ref 0.0–1.2)
Neutrophils %: 88 % — ABNORMAL HIGH (ref 35–70)
Neutrophils, Absolute: 10 10*3/ÂµL — ABNORMAL HIGH (ref 1.6–7.3)
Platelet Count: 68 10*3/ÂµL — ABNORMAL LOW (ref 150–400)
RBC: 4.59 10*6/ÂµL — ABNORMAL LOW (ref 4.70–6.10)
RDW: 14.3 % (ref 11.5–14.5)
WBC: 11.3 10*3/ÂµL — ABNORMAL HIGH (ref 4.8–10.8)

## 2015-07-08 LAB — PROTIME-INR
INR: 1.8 — ABNORMAL HIGH (ref 0.9–1.2)
Protime: 21.1 Seconds — ABNORMAL HIGH (ref 9.9–13.4)

## 2015-07-08 MED ORDER — predniSONE (DELTASONE) tablet 40 mg
20 | Freq: Every day | ORAL | Status: DC
Start: 2015-07-08 — End: 2015-07-09
  Administered 2015-07-08 – 2015-07-09 (×2): 20 mg via ORAL

## 2015-07-08 MED FILL — COUMADIN 5 MG TABLET: 5 mg | ORAL | Qty: 1

## 2015-07-08 MED FILL — BUDESONIDE 0.5 MG/2 ML SUSPENSION FOR NEBULIZATION: 0.5 | RESPIRATORY_TRACT | Qty: 2

## 2015-07-08 MED FILL — SODIUM CHLORIDE 0.9 % W/ POTASSIUM CHLORIDE 20 MEQ/L INTRAVENOUS SOLUTION: 20 meq/L | INTRAVENOUS | Qty: 1000

## 2015-07-08 MED FILL — BACID 1 BILLION CELL-250 MG TABLET: 1 billion cell- 250 mg | ORAL | Qty: 1

## 2015-07-08 MED FILL — BROVANA 15 MCG/2 ML SOLUTION FOR NEBULIZATION: 15 mcg/2 mL | RESPIRATORY_TRACT | Qty: 2

## 2015-07-08 MED FILL — CEFTRIAXONE 1 GRAM/50 ML IN DEXTROSE (ISO-OSMOT) INTRAVENOUS PIGGYBACK: 1 gram/50 mL | INTRAVENOUS | Qty: 50

## 2015-07-08 MED FILL — POTASSIUM CHLORIDE ER 10 MEQ TABLET,EXTENDED RELEASE(PART/CRYST): 10 meq | ORAL | Qty: 1

## 2015-07-08 MED FILL — OMEPRAZOLE 20 MG CAPSULE,DELAYED RELEASE: 20 mg | ORAL | Qty: 1

## 2015-07-08 MED FILL — AZITHROMYCIN 250 MG TABLET: 250 mg | ORAL | Qty: 2

## 2015-07-08 MED FILL — PREDNISONE 20 MG TABLET: 20 mg | ORAL | Qty: 1

## 2015-07-08 MED FILL — NICOTINE 21 MG/24 HR DAILY TRANSDERMAL PATCH: 21 mg/24 hr | TRANSDERMAL | Qty: 1

## 2015-07-08 NOTE — Plan of Care (Signed)
Problem: Cardiovascular - Adult  Goal: Absence of cardiac dysrhythmias or at baseline  INTERVENTIONS:  1. Continuous cardiac monitoring, monitor vital signs, obtain 12 lead EKG if indicated  2. Administer antiarrhythmic and heart rate control medications as ordered  3. Initiate emergency measures for life threatening arrhythmias  4. Monitor electrolytes and administer replacement therapy as ordered   Outcome: Not Progressing  Pt remains in a fib

## 2015-07-08 NOTE — Progress Notes (Signed)
History  FU visit for Community acquired bacterial pneumonia  Feels a bit better today .   No fevers since admit.   Still on oxygen, less however.       Medications  . arformoterol  15 mcg Nebulization 2 times daily   . cefTRIAXone (ROCEPHIN) IV  1 g Intravenous Q24H    And   . azithromycin  500 mg Oral Daily   . budesonide  0.5 mg Nebulization Q12H   . ciclopirox  15 g Topical BID   . L. acidophilus, L. bulgaricus  1 tablet Oral 2 times per day   . metoprolol succinate  50 mg Oral Daily   . nicotine  1 patch Transdermal Daily   . omeprazole  20 mg Oral Daily   . potassium chloride SA  10 mEq Oral Daily   . predniSONE  40 mg Oral Daily   . warfarin  5 mg Oral Daily     acetaminophen, hydromorphone, ipratropium-albuterol, ketorolac, midodrine, naloxone (NARCAN) injection 0.08 mg/2 mL, nitroglycerin, ondansetron, phenol, polyethylene glycol, sodium chloride 0.9 % (NS) syringe    Exam  Vitals:   Vitals:    07/08/15 0740   BP:    Pulse:    Resp:    Temp:    SpO2: 95%     Last 24 hr vital signs reviewed  I/O last 3 completed shifts:  In: 6561 [P.O.:720; I.V.:4841; IV Piggyback:1000]  Out: 1750 [Urine:1750]  I/O this shift:  In: 240 [P.O.:240]  Out: -     Const: alert, NAD  Lungs: some crackles at bases, mildly inc effort.  Heart: ? Iregular., distant, no murmur  Abd: soft, nontender, nondistended, no organomegaly, no masses  Ext: no edema    Results for orders placed or performed during the hospital encounter of 07/06/15 (from the past 24 hour(s))   Protime Panel -Daily    Collection Time: 07/08/15  6:21 AM   Result Value Ref Range    Protime 21.1 (H) 9.9 - 13.4 Seconds    INR 1.8 (H) 0.9 - 1.2   CBC with Auto Differential -AM Draw    Collection Time: 07/08/15  6:21 AM   Result Value Ref Range    WBC 11.3 (H) 4.8 - 10.8 10*3/?L    RBC 4.59 (L) 4.70 - 6.10 10*6/?L    Hemoglobin 15.3 12.0 - 18.0 g/dL    HCT 29.5 62.1 - 30.8 %    MCV 100.5 (H) 81.0 - 99.0 fL    MCH 33.4 27.0 - 34.0 pg    MCHC 33.3 32.0 - 36.0 g/dL    RDW  65.7 84.6 - 96.2 %    Platelet Count 68 (L) 150 - 400 10*3/?L    MPV 9.6 7.4 - 10.4 fL    Neutrophils % 88 (H) 35 - 70 %    Lymphocytes % 7 (L) 25 - 45 %    Monocytes % 4 0 - 12 %    Eosinophils % 0 0 - 7 %    Basophils % 0 0 - 2 %    Neutrophils, Absolute 10.0 (H) 1.6 - 7.3 10*3/?L    Lymphocytes, Absolute 0.8 (L) 1.1 - 4.3 10*3/?L    Monocytes, Absolute 0.5 0.0 - 1.2 10*3/?L    Eosinophils, Absolute 0.0 0.0 - 0.7 10*3/?L    Basophils, Absolute 0.0 0.0 - 0.2 10*3/?L    Differential Type Automated Differential        Assessment and Plan  Active Hospital Problems    *  Community acquired bacterial pneumonia   Improved, no fevers, blood cx neg thus far. Needs at least another 24 hr of iv abx, wait til cultures neg additionally.     COPD with exacerbation (CMS/HCC)   Increase PREDNISONE dosage.    Thrombocytopenia (CMS/HCC)   Lower yesterday, so I stopped lovenox. A bit better today. May need additional eval as outpt    A-fib (CMS/HCC)   On warfarin, INR is better.    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     Length of stay : 2 days      Mialynn Shelvin A. Mayford Knife, M.D.

## 2015-07-08 NOTE — Plan of Care (Signed)
Will continue to monitor and assess

## 2015-07-09 LAB — CBC WITH AUTO DIFFERENTIAL
Basophils %: 0 % (ref 0–2)
Basophils, Absolute: 0 10*3/ÂµL (ref 0.0–0.2)
Eosinophils %: 0 % (ref 0–7)
Eosinophils, Absolute: 0 10*3/ÂµL (ref 0.0–0.7)
HCT: 44.2 % (ref 42.0–54.0)
Hemoglobin: 15.4 g/dL (ref 12.0–18.0)
Lymphocytes %: 8 % — ABNORMAL LOW (ref 25–45)
Lymphocytes, Absolute: 0.7 10*3/ÂµL — ABNORMAL LOW (ref 1.1–4.3)
MCH: 34.2 pg — ABNORMAL HIGH (ref 27.0–34.0)
MCHC: 34.7 g/dL (ref 32.0–36.0)
MCV: 98.4 fL (ref 81.0–99.0)
MPV: 10.2 fL (ref 7.4–10.4)
Monocytes %: 4 % (ref 0–12)
Monocytes, Absolute: 0.4 10*3/ÂµL (ref 0.0–1.2)
Neutrophils %: 88 % — ABNORMAL HIGH (ref 35–70)
Neutrophils, Absolute: 7.9 10*3/ÂµL — ABNORMAL HIGH (ref 1.6–7.3)
Platelet Count: 69 10*3/ÂµL — ABNORMAL LOW (ref 150–400)
RBC: 4.49 10*6/ÂµL — ABNORMAL LOW (ref 4.70–6.10)
RDW: 13.7 % (ref 11.5–14.5)
WBC: 8.9 10*3/ÂµL (ref 4.8–10.8)

## 2015-07-09 LAB — PROTIME-INR
INR: 2.1 — ABNORMAL HIGH (ref 0.9–1.2)
Protime: 25.5 Seconds — ABNORMAL HIGH (ref 9.9–13.4)

## 2015-07-09 MED ORDER — azithromycin (ZITHROMAX) 250 MG tablet
250 | ORAL_TABLET | Freq: Every day | ORAL | 0 refills | Status: AC
Start: 2015-07-09 — End: 2015-07-12

## 2015-07-09 MED ORDER — predniSONE (DELTASONE) 10 MG tablet
10 | ORAL_TABLET | ORAL | 0 refills | 11.50000 days | Status: DC
Start: 2015-07-09 — End: 2015-11-22

## 2015-07-09 MED FILL — AZITHROMYCIN 250 MG TABLET: 250 mg | ORAL | Qty: 2

## 2015-07-09 MED FILL — BACID 1 BILLION CELL-250 MG TABLET: 1 billion cell- 250 mg | ORAL | Qty: 1

## 2015-07-09 MED FILL — BROVANA 15 MCG/2 ML SOLUTION FOR NEBULIZATION: 15 mcg/2 mL | RESPIRATORY_TRACT | Qty: 2

## 2015-07-09 MED FILL — PREDNISONE 20 MG TABLET: 20 mg | ORAL | Qty: 2

## 2015-07-09 MED FILL — BUDESONIDE 0.5 MG/2 ML SUSPENSION FOR NEBULIZATION: 0.5 | RESPIRATORY_TRACT | Qty: 2

## 2015-07-09 MED FILL — METOPROLOL SUCCINATE ER 50 MG TABLET,EXTENDED RELEASE 24 HR: 50 mg | ORAL | Qty: 1

## 2015-07-09 MED FILL — NICOTINE 21 MG/24 HR DAILY TRANSDERMAL PATCH: 21 mg/24 hr | TRANSDERMAL | Qty: 1

## 2015-07-09 MED FILL — ACETAMINOPHEN 325 MG TABLET: 325 mg | ORAL | Qty: 2

## 2015-07-09 MED FILL — OMEPRAZOLE 20 MG CAPSULE,DELAYED RELEASE: 20 mg | ORAL | Qty: 1

## 2015-07-09 MED FILL — POTASSIUM CHLORIDE ER 10 MEQ TABLET,EXTENDED RELEASE(PART/CRYST): 10 meq | ORAL | Qty: 1

## 2015-07-09 NOTE — Discharge Instructions (Signed)
Azithromycin tablets  What is this medicine?  AZITHROMYCIN (az ith roe MYE sin) is a macrolide antibiotic. It is used to treat or prevent certain kinds of bacterial infections. It will not work for colds, flu, or other viral infections.  This medicine may be used for other purposes; ask your health care provider or pharmacist if you have questions.  What should I tell my health care provider before I take this medicine?  They need to know if you have any of these conditions:  -kidney disease  -liver disease  -irregular heartbeat or heart disease  -an unusual or allergic reaction to azithromycin, erythromycin, other macrolide antibiotics, foods, dyes, or preservatives  -pregnant or trying to get pregnant  -breast-feeding  How should I use this medicine?  Take this medicine by mouth with a full glass of water. Follow the directions on the prescription label. The tablets can be taken with food or on an empty stomach. If the medicine upsets your stomach, take it with food. Take your medicine at regular intervals. Do not take your medicine more often than directed. Take all of your medicine as directed even if you think your are better. Do not skip doses or stop your medicine early. Talk to your pediatrician regarding the use of this medicine in children. Special care may be needed.  Overdosage: If you think you have taken too much of this medicine contact a poison control center or emergency room at once.  NOTE: This medicine is only for you. Do not share this medicine with others.  What if I miss a dose?  If you miss a dose, take it as soon as you can. If it is almost time for your next dose, take only that dose. Do not take double or extra doses.  What may interact with this medicine?  Do not take this medicine with any of the following medications:  -lincomycin  This medicine may also interact with the following medications:  -amiodarone  -antacids  -birth control  pills  -cyclosporine  -digoxin  -magnesium  -nelfinavir  -phenytoin  -warfarin  This list may not describe all possible interactions. Give your health care provider a list of all the medicines, herbs, non-prescription drugs, or dietary supplements you use. Also tell them if you smoke, drink alcohol, or use illegal drugs. Some items may interact with your medicine.  What should I watch for while using this medicine?  Tell your doctor or health care professional if your symptoms do not improve.  Do not treat diarrhea with over the counter products. Contact your doctor if you have diarrhea that lasts more than 2 days or if it is severe and watery.  This medicine can make you more sensitive to the sun. Keep out of the sun. If you cannot avoid being in the sun, wear protective clothing and use sunscreen. Do not use sun lamps or tanning beds/booths.  What side effects may I notice from receiving this medicine?  Side effects that you should report to your doctor or health care professional as soon as possible:  -allergic reactions like skin rash, itching or hives, swelling of the face, lips, or tongue  -confusion, nightmares or hallucinations  -dark urine  -difficulty breathing  -hearing loss  -irregular heartbeat or chest pain  -pain or difficulty passing urine  -redness, blistering, peeling or loosening of the skin, including inside the mouth  -white patches or sores in the mouth  -yellowing of the eyes or skin  Side effects that usually  do not require medical attention (report to your doctor or health care professional if they continue or are bothersome):  -diarrhea  -dizziness, drowsiness  -headache  -stomach upset or vomiting  -tooth discoloration  -vaginal irritation  This list may not describe all possible side effects. Call your doctor for medical advice about side effects. You may report side effects to FDA at 1-800-FDA-1088.  Where should I keep my medicine?  Keep out of the reach of children.  Store at room  temperature between 15 and 30 degrees C (59 and 86 degrees F). Throw away any unused medicine after the expiration date.  NOTE: This sheet is a summary. It may not cover all possible information. If you have questions about this medicine, talk to your doctor, pharmacist, or health care provider.     ? 2016, Elsevier/Gold Standard. (2012-11-09 15:38:48)    Prednisone tablets  What is this medicine?  PREDNISONE (PRED ni sone) is a corticosteroid. It is commonly used to treat inflammation of the skin, joints, lungs, and other organs. Common conditions treated include asthma, allergies, and arthritis. It is also used for other conditions, such as blood disorders and diseases of the adrenal glands.  This medicine may be used for other purposes; ask your health care provider or pharmacist if you have questions.  What should I tell my health care provider before I take this medicine?  They need to know if you have any of these conditions:  -Cushing's syndrome  -diabetes  -glaucoma  -heart disease  -high blood pressure  -infection (especially a virus infection such as chickenpox, cold sores, or herpes)  -kidney disease  -liver disease  -mental illness  -myasthenia gravis  -osteoporosis  -seizures  -stomach or intestine problems  -thyroid disease  -an unusual or allergic reaction to lactose, prednisone, other medicines, foods, dyes, or preservatives  -pregnant or trying to get pregnant  -breast-feeding  How should I use this medicine?  Take this medicine by mouth with a glass of water. Follow the directions on the prescription label. Take this medicine with food. If you are taking this medicine once a day, take it in the morning. Do not take more medicine than you are told to take. Do not suddenly stop taking your medicine because you may develop a severe reaction. Your doctor will tell you how much medicine to take. If your doctor wants you to stop the medicine, the dose may be slowly lowered over time to avoid any side  effects.  Talk to your pediatrician regarding the use of this medicine in children. Special care may be needed.  Overdosage: If you think you have taken too much of this medicine contact a poison control center or emergency room at once.  NOTE: This medicine is only for you. Do not share this medicine with others.  What if I miss a dose?  If you miss a dose, take it as soon as you can. If it is almost time for your next dose, talk to your doctor or health care professional. You may need to miss a dose or take an extra dose. Do not take double or extra doses without advice.  What may interact with this medicine?  Do not take this medicine with any of the following medications:  -metyrapone  -mifepristone  This medicine may also interact with the following medications:  -aminoglutethimide  -amphotericin B  -aspirin and aspirin-like medicines  -barbiturates  -certain medicines for diabetes, like glipizide or glyburide  -cholestyramine  -cholinesterase inhibitors  -  cyclosporine  -digoxin  -diuretics  -ephedrine  -male hormones, like estrogens and birth control pills  -isoniazid  -ketoconazole  -NSAIDS, medicines for pain and inflammation, like ibuprofen or naproxen  -phenytoin  -rifampin  -toxoids  -vaccines  -warfarin  This list may not describe all possible interactions. Give your health care provider a list of all the medicines, herbs, non-prescription drugs, or dietary supplements you use. Also tell them if you smoke, drink alcohol, or use illegal drugs. Some items may interact with your medicine.  What should I watch for while using this medicine?  Visit your doctor or health care professional for regular checks on your progress. If you are taking this medicine over a prolonged period, carry an identification card with your name and address, the type and dose of your medicine, and your doctor's name and address.  This medicine may increase your risk of getting an infection. Tell your doctor or health care  professional if you are around anyone with measles or chickenpox, or if you develop sores or blisters that do not heal properly.  If you are going to have surgery, tell your doctor or health care professional that you have taken this medicine within the last twelve months.  Ask your doctor or health care professional about your diet. You may need to lower the amount of salt you eat.  This medicine may affect blood sugar levels. If you have diabetes, check with your doctor or health care professional before you change your diet or the dose of your diabetic medicine.  What side effects may I notice from receiving this medicine?  Side effects that you should report to your doctor or health care professional as soon as possible:  -allergic reactions like skin rash, itching or hives, swelling of the face, lips, or tongue  -changes in emotions or moods  -changes in vision  -depressed mood  -eye pain  -fever or chills, cough, sore throat, pain or difficulty passing urine  -increased thirst  -swelling of ankles, feet  Side effects that usually do not require medical attention (report to your doctor or health care professional if they continue or are bothersome):  -confusion, excitement, restlessness  -headache  -nausea, vomiting  -skin problems, acne, thin and shiny skin  -trouble sleeping  -weight gain  This list may not describe all possible side effects. Call your doctor for medical advice about side effects. You may report side effects to FDA at 1-800-FDA-1088.  Where should I keep my medicine?  Keep out of the reach of children.  Store at room temperature between 15 and 30 degrees C (59 and 86 degrees F). Protect from light. Keep container tightly closed. Throw away any unused medicine after the expiration date.  NOTE: This sheet is a summary. It may not cover all possible information. If you have questions about this medicine, talk to your doctor, pharmacist, or health care provider.     ? 2016, Elsevier/Gold  Standard. (2010-11-19 10:57:14)      Community-Acquired Pneumonia, Adult  Pneumonia is an infection of the lungs. One type of pneumonia can happen while a person is in a hospital. A different type can happen when a person is not in a hospital (community-acquired pneumonia). It is easy for this kind to spread from person to person. It can spread to you if you breathe near an infected person who coughs or sneezes. Some symptoms include:  ? A dry cough.  ? A wet (productive) cough.  ? Fever.  ? Sweating.  ?  Chest pain.  HOME CARE  ? Take over-the-counter and prescription medicines only as told by your doctor.    Only take cough medicine if you are losing sleep.    If you were prescribed an antibiotic medicine, take it as told by your doctor. Do not stop taking the antibiotic even if you start to feel better.  ? Sleep with your head and neck raised (elevated). You can do this by putting a few pillows under your head, or you can sleep in a recliner.  ? Do not use tobacco products. These include cigarettes, chewing tobacco, and e-cigarettes. If you need help quitting, ask your doctor.  ? Drink enough water to keep your pee (urine) clear or pale yellow.  A shot (vaccine) can help prevent pneumonia. Shots are often suggested for:  ? People older than 77 years of age.  ? People older than 77 years of age:    Who are having cancer treatment.    Who have long-term (chronic) lung disease.    Who have problems with their body's defense system (immune system).  You may also prevent pneumonia if you take these actions:  ? Get the flu (influenza) shot every year.  ? Go to the dentist as often as told.  ? Wash your hands often. If soap and water are not available, use hand sanitizer.  GET HELP IF:  ? You have a fever.  ? You lose sleep because your cough medicine does not help.  GET HELP RIGHT AWAY IF:  ? You are short of breath and it gets worse.  ? You have more chest pain.  ? Your sickness gets worse. This is very serious if:     You are an older adult.    Your body's defense system is weak.  ? You cough up blood.     This information is not intended to replace advice given to you by your health care provider. Make sure you discuss any questions you have with your health care provider.     Document Released: 09/22/2007 Document Revised: 12/25/2014 Document Reviewed: 07/31/2014  Elsevier Interactive Patient Education ?2016 Elsevier Inc.

## 2015-07-09 NOTE — Progress Notes (Addendum)
Pt was waiting since 10 am to be discharged. RT tried to working on his home oxygen set up since this morning. [employer] required to receive signed paper from MD but they stated they didn't receive it yet. Checked with MD and stated he faxed it to the [employer] around 11am. Pt stated can't wait any longer and they wait enough. RT provide [employer] number to the patient and pt understand where to call for the oxygen refill.

## 2015-07-09 NOTE — Progress Notes (Signed)
PHARMACIST PROGRESS NOTE     Consult Per Physician Order:  Pre-Discharge Pneumonia Patient Education    Eduardo Khan. is a 75 y.o. male. I spoke with the patient or representative and provided pneumonia education. The patient or representative verbalized understanding of the following:    1. The reason for taking antibiotics & importance of medication adherence.    2. The importance of contacting primary care provider, or ED in severe cases, if the patient has drug allergy, intolerance, side effects &/or persistence or worsening of symptoms: Examples:  Fever, more frequent coughs, inhaler is used more often than directed &/or inhaler does not provide relief.    3. Methods to prevent pneumonia. Examples: Get pneumonia & flu vaccines, wash hands.    4. The patient has been provided written education materials, such as Pneumonia Antibiotic Fact sheet &/or pneumonia brochure.    5. Barriers expressed by the patient to obtaining medications have been addressed or referred to appropriate resources.    Ashley Akin, RPH, Pharmacist

## 2015-07-09 NOTE — Progress Notes (Signed)
Home Oxygen Assessment     Patients Name:Eduardo Khan.   Date of Birth:Sep 29, 1940  Age 75 y.o.   Patients Address:   [address]  [place] [address] Admitting Diagnosis:CAP (community acquired pneumonia)   Patients Phone: [phone] (home)  Patients OZH:[id number] Mayford Knife, MD   Admit Date:07/06/2015   Referred Date: 07/09/2015   Coverage Info:  Payor/Plan Subscriber Name Rel Member # Group #   MEDICARE - MEDICARE P* Khan,Eduardo HA*  [id number]          TRICARE FOR LIFE - TR* Khan,Eduardo HA*  [id number]       [address]        Testing Results     Referral for new home oxygen received.  Patient was tested with below results:    SpO2 at rest room air: 88  SpO2 at rest with oxygen: 93  O2 At Rest (L/min): 3 L/min    SpO2 with ambulation room air: 87  SpO2 with ambulation with oxygen: 92  O2 With Ambulation (L/min): 3 L/min     O2 Recommended: Yes    Documentation and  RX obtained and sent to: [facility] for oxygen to be Delivered to: Bedside  RX To be obtained from provider.    Testing Done at [facility]     Janifer Gieselman J. Patsie Mccardle RRT/BSRC, 07/09/2015   Respiratory Therapy

## 2015-07-09 NOTE — Discharge Summary (Addendum)
Physician Discharge Summary     Patient ID:  Name: Eduardo Khan.  DOB: [birth year] 75 y.o.  MRN: [id number]  CSN: [id number]    Admit date: 07/06/2015    Discharge date: 07/09/2015    Admission Diagnosis: CAP (community acquired pneumonia)    Discharge Diagnoses:    Active Hospital Problems    Diagnosis SNOMED CT(R) Date Noted   . Community acquired bacterial pneumonia COMMUNITY ACQUIRED PNEUMONIA 07/06/2015     Priority: High     Class: Acute   . COPD with exacerbation (CMS/HCC) ACUTE EXACERBATION OF CHRONIC OBSTRUCTIVE AIRWAYS DISEASE 07/07/2015     Priority: Medium   . Thrombocytopenia (CMS/HCC) PLATELET COUNT BELOW REFERENCE RANGE 07/07/2015   . A-fib (CMS/HCC) ATRIAL FIBRILLATION 07/06/2015     Class: Chronic       Significant Diagnostic Studies: chest x ray, chest CT  Results for orders placed or performed during the hospital encounter of 07/06/15 (from the past 24 hour(s))   Protime Panel -Daily    Collection Time: 07/09/15  6:14 AM   Result Value Ref Range    Protime 25.5 (H) 9.9 - 13.4 Seconds    INR 2.1 (H) 0.9 - 1.2   CBC with Auto Differential -AM Draw    Collection Time: 07/09/15  6:14 AM   Result Value Ref Range    WBC 8.9 4.8 - 10.8 10*3/?L    RBC 4.49 (L) 4.70 - 6.10 10*6/?L    Hemoglobin 15.4 12.0 - 18.0 g/dL    HCT 29.5 62.1 - 30.8 %    MCV 98.4 81.0 - 99.0 fL    MCH 34.2 (H) 27.0 - 34.0 pg    MCHC 34.7 32.0 - 36.0 g/dL    RDW 65.7 84.6 - 96.2 %    Platelet Count 69 (L) 150 - 400 10*3/?L    MPV 10.2 7.4 - 10.4 fL    Neutrophils % 88 (H) 35 - 70 %    Lymphocytes % 8 (L) 25 - 45 %    Monocytes % 4 0 - 12 %    Eosinophils % 0 0 - 7 %    Basophils % 0 0 - 2 %    Neutrophils, Absolute 7.9 (H) 1.6 - 7.3 10*3/?L    Lymphocytes, Absolute 0.7 (L) 1.1 - 4.3 10*3/?L    Monocytes, Absolute 0.4 0.0 - 1.2 10*3/?L    Eosinophils, Absolute 0.0 0.0 - 0.7 10*3/?L    Basophils, Absolute 0.0 0.0 - 0.2 10*3/?L    Differential Type Automated Differential      Pending Labs     Order Current Status    Blood Culture  -x 2 Preliminary result    Blood Culture -x 2 Preliminary result        Procedures/Treatments: IV rocephin and oral zithromax.    Consults:   IP CONSULT TO PHARMACY    Physical Exam:   Heart irreg, and chest wiht bibasilar crackles.    Hospital Course: Admitted with fever, feeling sick, and chest film showed pneumonia.  He was admitted, placed on abx as noted, given nebs, and improved.  CT confirmed bilat pneumonia. Nothing in abdomen to explain his slow wt loss. He improved, was able to walk. He'll be sent home with abx, oxygen, and already has nebs at home.    Discharge Medications:   [name], [name].   Discharge Medications [id number]    Printed on:07/09/15 0825   Medication Information  fluticasone-salmeterol (ADVAIR) 100-50 mcg/dose diskus inhaler  Inhale 1 puff into the lungs 2 (two) times daily.        hydroCHLOROthiazide (HYDRODIURIL) 25 MG tablet  Take 12.5 mg by mouth daily.        ipratropium-albuterol (DUO-NEB) 0.5 mg-3 mg(2.5 mg base)/3 mL nebulizer solution  Take 3 mLs by nebulization every 6 (six) hours as needed for Wheezing.        nadolol (CORGARD) 40 MG tablet  Take 40 mg by mouth daily.        OMEPRAZOLE ORAL  Take 20 mg by mouth daily.         potassium chloride (KLOR-CON) 10 MEQ CR tablet  Take 10 mEq by mouth daily.        tiotropium (SPIRIVA) 18 mcg inhalation  Inhale 18 mcg into the lungs daily.        warfarin (COUMADIN) 5 MG tablet  Take 5 mg by mouth daily. 2.5mg  every other day          Also will be on zithromax 250 mg daily for 3d and prednisone taper 10 mg tabs. (4 daily x2d etc. )    Patient Instructions/Follow-up Appointments:     Activity: activity as tolerated  Diet: cardiac diet  Discharge Disposition: To home with family caregiver  Follow-up with Dr Mayford Knife in 2d.      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     Signed:  Levander Campion  07/09/2015  8:30 AM

## 2015-07-16 NOTE — Telephone Encounter (Signed)
PATIENT INFORMATION   Hospital: [facility]  Disposition: Home or Self Care  High Risk for Readmission: No  Contact made on 07/16/2015 by Mason Jim, spoke with patient.     PATIENT ASSESSMENT   How is patient feeling since being discharged from the hospital?  Doing better, still working on it Breathing better.  Were discharge instructions gone over with you prior to discharge?    Yes  Does patient have the support needed at home?    Yes. Has wife to help.   f patient has family or caregiver, do they have any questions or concerns?  no  If home care services were ordered at the time of discharge, have they made contact with the patient?    n/a     MEDICATIONS   Does patient understand what medication he is supposed to be taking?  Yes  Has patient filled all medications prescribed at discharge?  Yes. Patient appears to have a good understanding of their medications.  Any questions about medication and/or side effects?  no  Medications were reviewed with patient at time of call, questions & concerns were addressed. Patient reminded to bring all medications to appointment.    FOLLOW UP   Does patient have followup appointment with PCP?   yes -saw once and has another appt on April 10th.  Is the followup with PCP within 3-5 days of discharge?    yes  Does patient have followup with a specialist?  If so who and when?  no  Does patient need help with transportation to appointments?  no  Reviewed and discussed any diagnostic tests and/or procedures that were recommended at the time of hospital discharge.    COMPLETION OF CALL   Are there any further questions or concerns the patient has prior to completing the call?  no  Is there anything that could have better prepared you for caring for yourself at home?  no  Patient notified of the potential of getting a survey and encouraged to fill it out.    Has patient's breathing returned to baseline?  No  not quite. Pt states he does not have a  rescue inhaler. Uses spiriva and advair on regular basis. Uses duoneb on a regular basis Bid, not using it or inhaler for prn. Inst him to talk to his Dr re this. He states he is  doing ok the way it is  Has the patient had to use rescue inhaler, if so how often?  No see above

## 2015-09-04 LAB — BODY FLUID CULTURE, AEROBIC/ANAEROBIC: Gram Stain: NONE SEEN

## 2015-09-04 LAB — URIC ACID, BODY FLUID: Uric Acid, body fluid: 5.5 mg/dL

## 2015-09-04 LAB — BODY FLUID CELL COUNT WITH DIFFERENTIAL: TNC Count (inc WBC), Body fluid: 1178 /mm3

## 2015-09-04 LAB — DIFFERENTIAL PANEL, FLUID
Lymphocytes % Body Fluid: 1 %
Monocytes/Macrophages % Body Fluid: 2 %
Other Cells % Body Fluid: 2 %
Seg neutrophils, fluid: 95 %
Total Cells Counted in Differential, Body Fluid: 100

## 2015-09-04 LAB — POLARIZED CRYSTAL EXAM: Crystals: NONE SEEN

## 2015-11-21 NOTE — ED Triage Notes (Signed)
Pt was out in the heat for a couple hours yesterday - today he has felt weak-shakey and not well- he does not drink water well - he feels chilled - he has copd and continues to smoke - he had pneumonia a month ago - he does use o2 at night

## 2015-11-22 ENCOUNTER — Ambulatory Visit: Admit: 2015-11-22 | Discharge: 2015-11-22 | Payer: MEDICARE | Attending: Addiction Medicine | Primary: MD

## 2015-11-22 ENCOUNTER — Inpatient Hospital Stay: Admit: 2015-11-22 | Discharge: 2015-11-22 | Discharge status: Against Medical Advice | Payer: MEDICARE

## 2015-11-22 DIAGNOSIS — J440 Chronic obstructive pulmonary disease with acute lower respiratory infection: Secondary | ICD-10-CM

## 2015-11-22 DIAGNOSIS — R5381 Other malaise: Secondary | ICD-10-CM

## 2015-11-22 LAB — CBC WITH AUTO DIFFERENTIAL
Basophils %: 0 % (ref 0–2)
Basophils, Absolute: 0 10*3/ÂµL (ref 0.0–0.2)
Eosinophils %: 1 % (ref 0–7)
Eosinophils, Absolute: 0.1 10*3/ÂµL (ref 0.0–0.7)
HCT: 51.6 % (ref 42.0–54.0)
Hemoglobin: 17.6 g/dL (ref 12.0–18.0)
Lymphocytes %: 8 % — ABNORMAL LOW (ref 25–45)
Lymphocytes, Absolute: 0.8 10*3/ÂµL — ABNORMAL LOW (ref 1.1–4.3)
MCH: 32.9 pg (ref 27.0–34.0)
MCHC: 34.1 g/dL (ref 32.0–36.0)
MCV: 96.5 fL (ref 81.0–99.0)
MPV: 9.3 fL (ref 7.4–10.4)
Monocytes %: 11 % (ref 0–12)
Monocytes, Absolute: 1.1 10*3/ÂµL (ref 0.0–1.2)
Neutrophils %: 81 % — ABNORMAL HIGH (ref 35–70)
Neutrophils, Absolute: 8 10*3/ÂµL — ABNORMAL HIGH (ref 1.6–7.3)
Platelet Count: 114 10*3/ÂµL — ABNORMAL LOW (ref 150–400)
RBC: 5.35 10*6/ÂµL (ref 4.70–6.10)
RDW: 13.9 % (ref 11.5–14.5)
WBC: 9.9 10*3/ÂµL (ref 4.8–10.8)

## 2015-11-22 LAB — COMPREHENSIVE METABOLIC PANEL
ALT - Alanine Aminotransferase: 14 IU/L (ref 7–52)
AST - Aspartate Aminotransferase: 19 IU/L (ref 10–50)
Albumin/Globulin Ratio: 1.4 (ref >0.9)
Albumin: 4.1 g/dL (ref 3.5–5.0)
Alkaline Phosphatase: 98 IU/L (ref 34–104)
Anion Gap: 10 mmol/L (ref 3.0–11.0)
BUN: 22 mg/dL (ref 6–23)
Bilirubin Total: 1.5 mg/dL — ABNORMAL HIGH (ref 0.3–1.2)
CO2 - Carbon Dioxide: 25 mmol/L (ref 21.0–31.0)
Calcium: 9.1 mg/dL (ref 8.6–10.3)
Chloride: 101 mmol/L (ref 98–111)
Creatinine: 0.97 mg/dL (ref 0.65–1.30)
GFR Estimate: >60 mL/min/{1.73_m2} (ref >=60)
Globulin: 2.9 g/dL (ref 2.2–3.7)
Glucose: 115 mg/dL — ABNORMAL HIGH (ref 80–99)
Potassium: 3.8 mmol/L (ref 3.5–5.1)
Protein Total: 7 g/dL (ref 6.0–8.0)
Sodium: 136 mmol/L (ref 135–143)

## 2015-11-22 MED ORDER — amoxicillin-clavulanate (AUGMENTIN) 875-125 mg per tablet
875-125 | ORAL_TABLET | Freq: Two times a day (BID) | ORAL | 0 refills | 7.00000 days | Status: DC
Start: 2015-11-22 — End: 2015-11-25

## 2015-11-22 NOTE — ED Notes (Signed)
Daughter and wife arrived, clarified that he took 2 amoxicillin pills this morning instead of just one.  Wife clarified she called ambulance because he fell when trying to get out of bed.  She stated that this happened the last time that he had pneumonia.

## 2015-11-22 NOTE — ED Triage Notes (Signed)
Paramedic stated that wife said pt was more confused today.  He was seen yesterday at urgent care and was diagnosed with pnm and given amoxicillin, paramedics think wife possibly  gave a few too many antibiotic pills today/yesterday.  Pt had a severe pnm episode a few months ago

## 2015-11-22 NOTE — ED Notes (Signed)
Pt placed on 2L NC per Md.  Family states at home he sleeps with 2L but has been on it all day due to his confusion

## 2015-11-22 NOTE — ED Notes (Signed)
Bed: 107-01  Expected date:   Expected time:   Means of arrival:   Comments:  75 y/o M, confusion

## 2015-11-22 NOTE — ED Notes (Addendum)
Pt given water to drink, straight cath completed, pt tolerated well. Pt and family notified of admission. Pt answering questions appropriately

## 2015-11-22 NOTE — Discharge Instructions (Signed)

## 2015-11-22 NOTE — Progress Notes (Signed)
Eduardo Khan. is a 75 y.o. male, DOB 09/13/1940, who presents today for Chills (x 3 days; went to ER and they took his blood); Shaking (x 3 days; has been taking oxygen for the night); and Fever (off and on x 3 days)  .    HISTORY OF PRESENT ILLNESS:  HPI  This is a 75 year old man that is here because he states he's been having chills for approximately 3 days with increased shaking and also shaping in the back, which worse over the last 3 days.  Wife states that he had similar symptoms when he had pneumonia in March of this year.  He also complains of not feeling well in general.    Past Medical History:   Diagnosis Date   . A-fib (CMS/HCC)    . A-fib (CMS/HCC)    . COPD (chronic obstructive pulmonary disease) (CMS/HCC)    . GERD (gastroesophageal reflux disease)    . Hypertension      Allergies   Allergen Reactions   . Venom-Honey Bee Anaphylaxis   . Codeine Nausea And Vomiting   . Morphine Nausea And Vomiting     Past Surgical History:   Procedure Laterality Date   . BACK SURGERY      L3/4 herniated disk 2015?   Marland Kitchen EYE SURGERY      both eyes, cataracts, right eye first of March     Social History     Social History Main Topics   . Smoking status: Current Every Day Smoker     Packs/day: 1.00   . Smokeless tobacco: Never Used      Comment: exposed   . Alcohol use 0.6 oz/week     1 Cans of beer per week      Comment: occasion   . Drug use: None   . Sexual activity: Not Asked     Social History   . Marital status: Married     Spouse name: N/A   . Number of children: N/A   . Years of education: N/A     Other Topics Concern   . None       REVIEW OF SYSTEMS:    Review of Systems   Constitutional: Positive for activity change, chills and fatigue.   HENT: Negative.    Respiratory: Positive for shortness of breath. Negative for cough, chest tightness and wheezing.    Cardiovascular: Negative.    Musculoskeletal: Negative.    Skin: Negative for color change, rash and wound.   Neurological: Positive for tremors,  weakness and light-headedness. Negative for seizures, syncope, speech difficulty and numbness.   Psychiatric/Behavioral: Negative.           PHYSICAL EXAM:    Vitals:    11/22/15 1308   BP: 114/80   Pulse: 67   Resp: 18   Temp: 36.9 ?C (98.4 ?F)   SpO2: 96%     Body mass index is 22.52 kg/(m^2).    Physical Exam   Pulmonary/Chest:   Inspiratory efforts decreased, no wheezes are audible and no crepitus is notably the bilaterally because there is very minimal inspiratory expiratory air exchange.   Skin: Skin is warm.   Psychiatric: He has a normal mood and affect. His behavior is normal. Judgment and thought content normal.   Nursing note and vitals reviewed.        ASSESSMENT & PLAN:      ICD-9-CM ICD-10-CM    1. Malaise 780.79 R53.81 X-ray chest PA and lateral  X-ray chest PA and lateral   2. Smoker 305.1 F17.200      Patient has a number of inhalants a ready for COPD and he is on warfarin for the atrial fibrillation. I will start him on Augmentin 875 mg to take twice a day to see if this is can treat the infiltrate and he has been asked to return here in approximately 5 days or follow-up with his primary care physician.      NOTE: Portions of this MEDICAL RECORD were completed using Dragon voice recognition software. While every effort was made to proofread this document, some transcription errors may remain.

## 2015-11-23 ENCOUNTER — Inpatient Hospital Stay
Admission: EM | Admit: 2015-11-23 | Discharge: 2015-11-25 | Disposition: A | Discharge status: Home Health | Payer: MEDICARE | Admitting: MD

## 2015-11-23 LAB — COMPREHENSIVE METABOLIC PANEL
ALT - Alanine Aminotransferase: 15 IU/L (ref 7–52)
AST - Aspartate Aminotransferase: 23 IU/L (ref 10–50)
Albumin/Globulin Ratio: 1.3 (ref >0.9)
Albumin: 3.6 g/dL (ref 3.5–5.0)
Alkaline Phosphatase: 85 IU/L (ref 34–104)
Anion Gap: 9 mmol/L (ref 3.0–11.0)
BUN: 24 mg/dL — ABNORMAL HIGH (ref 6–23)
Bilirubin Total: 1.4 mg/dL — ABNORMAL HIGH (ref 0.3–1.2)
CO2 - Carbon Dioxide: 19 mmol/L — ABNORMAL LOW (ref 21.0–31.0)
Calcium: 8.4 mg/dL — ABNORMAL LOW (ref 8.6–10.3)
Chloride: 104 mmol/L (ref 98–111)
Creatinine: 0.85 mg/dL (ref 0.65–1.30)
GFR Estimate: >60 mL/min/{1.73_m2} (ref >=60)
Globulin: 2.7 g/dL (ref 2.2–3.7)
Glucose: 115 mg/dL — ABNORMAL HIGH (ref 80–99)
Potassium: 3.6 mmol/L (ref 3.5–5.1)
Protein Total: 6.3 g/dL (ref 6.0–8.0)
Sodium: 132 mmol/L — ABNORMAL LOW (ref 135–143)

## 2015-11-23 LAB — ABG
Allen's Test: POSITIVE
Base Excess: 0.2 mmol/L (ref ?–2.0)
Carboxyhemoglobin: 2.7 % — ABNORMAL HIGH (ref 0.0–1.5)
HCO3 Arterial: 21.6 mmol/L — ABNORMAL LOW (ref 22.0–26.0)
Hemoglobin: 16.8 g/dL (ref 12.0–18.0)
Methemoglobin: 0.5 % (ref 0.0–1.0)
O2 Liter Flow: 2 L/min
O2Hb: 89 % — ABNORMAL LOW (ref 94.0–97.0)
Puncture Attempts: 1
Time Analyzed: 20170805233200
pCO2 Arterial: 27.6 mmHg — ABNORMAL LOW (ref 35.0–45.0)
pH Arterial: 7.511 — ABNORMAL HIGH (ref 7.350–7.450)
pO2 Arterial: 55.3 mmHg — ABNORMAL LOW (ref 80.0–100.0)
sO2 Arterial: 91.9 % — ABNORMAL LOW (ref 92.0–98.5)

## 2015-11-23 LAB — UA WITH REFLEX MICRO
Ascorbic Acid, Urine: NEGATIVE
Bacteria, Urine: NONE SEEN /HPF
Bilirubin, Urine: NEGATIVE
Glucose, Urine: NEGATIVE mg/dL
Leukocyte Esterase, Urine: NEGATIVE
Nitrite, Urine: NEGATIVE
RBC, Urine: 2 /HPF (ref <=5)
Specific Gravity, Urine: 1.026 (ref 1.003–1.030)
Squamous Epithelial, Urine: <1 /HPF (ref <=5)
Urobilinogen, Urine: 4 mg/dL — AB
White Blood Cell Microscopic Numeric: 1 /HPF (ref <=9)
pH, UA: 5 (ref 5.0–7.5)

## 2015-11-23 LAB — BLOOD CULTURE
Blood Culture Result: NO GROWTH
Blood Culture Result: NO GROWTH

## 2015-11-23 LAB — CBC WITH AUTO DIFFERENTIAL
Basophils %: 1 % (ref 0–2)
Basophils, Absolute: 0.1 10*3/ÂµL (ref 0.0–0.2)
Eosinophils %: 0 % (ref 0–7)
Eosinophils, Absolute: 0 10*3/ÂµL (ref 0.0–0.7)
HCT: 49.6 % (ref 42.0–54.0)
Hemoglobin: 16.7 g/dL (ref 12.0–18.0)
Lymphocytes %: 7 % — ABNORMAL LOW (ref 25–45)
Lymphocytes, Absolute: 0.6 10*3/ÂµL — ABNORMAL LOW (ref 1.1–4.3)
MCH: 32.4 pg (ref 27.0–34.0)
MCHC: 33.7 g/dL (ref 32.0–36.0)
MCV: 96.2 fL (ref 81.0–99.0)
MPV: 9.5 fL (ref 7.4–10.4)
Monocytes %: 10 % (ref 0–12)
Monocytes, Absolute: 0.8 10*3/ÂµL (ref 0.0–1.2)
Neutrophils %: 83 % — ABNORMAL HIGH (ref 35–70)
Neutrophils, Absolute: 7 10*3/ÂµL (ref 1.6–7.3)
Platelet Count: 92 10*3/ÂµL — ABNORMAL LOW (ref 150–400)
RBC: 5.16 10*6/ÂµL (ref 4.70–6.10)
RDW: 13.5 % (ref 11.5–14.5)
WBC: 8.4 10*3/ÂµL (ref 4.8–10.8)

## 2015-11-23 LAB — PT & PTT
APTT: 38 Seconds — ABNORMAL HIGH (ref 23–36)
INR: 2.3 — ABNORMAL HIGH (ref 0.9–1.2)
Protime: 27.3 Seconds — ABNORMAL HIGH (ref 9.9–13.4)

## 2015-11-23 LAB — LACTIC ACID ED: Lactic Acid ED: 1.3 mmol/L (ref 0.5–2.2)

## 2015-11-23 MED ORDER — omeprazole (PriLOSEC) capsule 20 mg
20 | Freq: Every day | ORAL | Status: DC
Start: 2015-11-23 — End: 2015-11-25
  Administered 2015-11-23 – 2015-11-25 (×3): 20 mg via ORAL

## 2015-11-23 MED ORDER — warfarin (COUMADIN) tablet 2.5 mg
2.5 | ORAL | Status: DC
Start: 2015-11-23 — End: 2015-11-25
  Administered 2015-11-23: 2.5 mg via ORAL

## 2015-11-23 MED ORDER — azithromycin (ZITHROMAX) 500 mg in sodium chloride 0.9 % (NS) 250 mL IVPB
500 | INTRAVENOUS | Status: DC
Start: 2015-11-23 — End: 2015-11-25
  Administered 2015-11-23 – 2015-11-25 (×6): via INTRAVENOUS

## 2015-11-23 MED ORDER — warfarin (COUMADIN) tablet 5 mg
5 | ORAL | Status: DC
Start: 2015-11-23 — End: 2015-11-25
  Administered 2015-11-24: 5 mg via ORAL

## 2015-11-23 MED ORDER — metoprolol succinate (TOPROL-XL) 24 hr tablet 25 mg
25 | Freq: Every day | ORAL | Status: DC
Start: 2015-11-23 — End: 2015-11-25
  Administered 2015-11-23 – 2015-11-25 (×3): 25 mg via ORAL

## 2015-11-23 MED ORDER — arformoterol (BROVANA) nebulizer solution 15 mcg
15 | Freq: Two times a day (BID) | RESPIRATORY_TRACT | Status: DC
Start: 2015-11-23 — End: 2015-11-25
  Administered 2015-11-24 – 2015-11-25 (×4): 15 ug via RESPIRATORY_TRACT

## 2015-11-23 MED ORDER — acetaminophen (TYLENOL) tablet 650 mg
325 | Freq: Four times a day (QID) | ORAL | Status: DC | PRN
Start: 2015-11-23 — End: 2015-11-23

## 2015-11-23 MED ORDER — fluticasone-salmeterol (ADVAIR) 250-50 mcg/dose diskus inhaler 1 puff
250-50 | Freq: Two times a day (BID) | RESPIRATORY_TRACT | Status: DC
Start: 2015-11-23 — End: 2015-11-23

## 2015-11-23 MED ORDER — potassium chloride SA (K-DUR,KLOR-CON) CR tablet 10 mEq
10 | Freq: Every day | ORAL | Status: DC
Start: 2015-11-23 — End: 2015-11-25
  Administered 2015-11-23 – 2015-11-25 (×3): 10 meq via ORAL

## 2015-11-23 MED ORDER — acetaminophen (TYLENOL) tablet 650 mg
325 | ORAL | Status: DC | PRN
Start: 2015-11-23 — End: 2015-11-25
  Administered 2015-11-24: 04:00:00 325 mg via ORAL

## 2015-11-23 MED ORDER — dextrose 5 % and sodium chloride 0.9 % infusion
INTRAVENOUS | Status: DC
Start: 2015-11-23 — End: 2015-11-24
  Administered 2015-11-23 – 2015-11-25 (×3): via INTRAVENOUS

## 2015-11-23 MED ORDER — clotrimazole (MYCELEX) troche 10 mg
10 | Freq: Every day | Status: DC
Start: 2015-11-23 — End: 2015-11-25
  Administered 2015-11-23 – 2015-11-25 (×10): 10 mg via ORAL

## 2015-11-23 MED ORDER — azithromycin (ZITHROMAX) 500 mg in sodium chloride 0.9 % (NS) 250 mL IVPB
500 | Freq: Once | INTRAVENOUS | Status: AC
Start: 2015-11-23 — End: 2015-11-23
  Administered 2015-11-23 (×2): via INTRAVENOUS

## 2015-11-23 MED ORDER — ipratropium-albuterol (DUO-NEB) 0.5 mg-3 mg(2.5 mg base)/3 mL nebulizer solution 3 mL
0.5 | Freq: Three times a day (TID) | RESPIRATORY_TRACT | Status: DC
Start: 2015-11-23 — End: 2015-11-25
  Administered 2015-11-23 – 2015-11-25 (×6): 0.5 mL via RESPIRATORY_TRACT

## 2015-11-23 MED ORDER — cefTRIAXone (ROCEPHIN) 2 g in sodium chloride 0.9% 100 mL MB+ IVPB
2 | Freq: Once | INTRAMUSCULAR | Status: AC
Start: 2015-11-23 — End: 2015-11-23
  Administered 2015-11-23 (×2): via INTRAVENOUS

## 2015-11-23 MED ORDER — budesonide (PULMICORT) nebulizer suspension 0.5 mg
0.5 | Freq: Two times a day (BID) | RESPIRATORY_TRACT | Status: DC
Start: 2015-11-23 — End: 2015-11-25
  Administered 2015-11-24 – 2015-11-25 (×4): 0.5 mg via RESPIRATORY_TRACT

## 2015-11-23 MED ORDER — potassium chloride SA (K-DUR,KLOR-CON) CR tablet 10 mEq
10 | Freq: Every day | ORAL | Status: DC
Start: 2015-11-23 — End: 2015-11-23

## 2015-11-23 MED ORDER — nicotine (NICODERM CQ) 21 mg/24 hr patch 1 patch
21 | Freq: Once | TRANSDERMAL | Status: AC
Start: 2015-11-23 — End: 2015-11-23
  Administered 2015-11-23: 07:00:00 21 via TRANSDERMAL

## 2015-11-23 MED ORDER — nicotine (NICODERM CQ) 21 mg/24 hr patch 1 patch
21 | Freq: Every day | TRANSDERMAL | Status: DC
Start: 2015-11-23 — End: 2015-11-25
  Administered 2015-11-23 – 2015-11-24 (×2): 21 via TRANSDERMAL

## 2015-11-23 MED ORDER — warfarin (COUMADIN) tablet 5 mg
5 | ORAL | Status: DC
Start: 2015-11-23 — End: 2015-11-23

## 2015-11-23 MED ORDER — ipratropium-albuterol (DUO-NEB) 0.5 mg-3 mg(2.5 mg base)/3 mL nebulizer solution 3 mL
0.5 | RESPIRATORY_TRACT | Status: DC | PRN
Start: 2015-11-23 — End: 2015-11-25

## 2015-11-23 MED ORDER — tiotropium (SPIRIVA) inhalation 18 mcg
18 | Freq: Every day | RESPIRATORY_TRACT | Status: DC
Start: 2015-11-23 — End: 2015-11-23

## 2015-11-23 MED ORDER — cefTRIAXone (ROCEPHIN) 1 g in D5W IVPB Premix
1 | INTRAVENOUS | Status: DC
Start: 2015-11-23 — End: 2015-11-25
  Administered 2015-11-23: 23:00:00 150 g via INTRAVENOUS
  Administered 2015-11-23: 23:00:00 1 g via INTRAVENOUS
  Administered 2015-11-24: 150 g via INTRAVENOUS
  Administered 2015-11-24: 23:00:00 1 g via INTRAVENOUS
  Administered 2015-11-25: 17:00:00 150 g via INTRAVENOUS
  Administered 2015-11-25: 16:00:00 1 g via INTRAVENOUS

## 2015-11-23 MED FILL — ZITHROMAX 500 MG INTRAVENOUS SOLUTION: 500 mg | INTRAVENOUS | Qty: 500

## 2015-11-23 MED FILL — DEXTROSE 5 % AND 0.9 % SODIUM CHLORIDE INTRAVENOUS SOLUTION: INTRAVENOUS | Qty: 1000

## 2015-11-23 MED FILL — CEFTRIAXONE 2 GRAM SOLUTION FOR INJECTION: 2 g | INTRAMUSCULAR | Qty: 2

## 2015-11-23 MED FILL — CEFTRIAXONE 1 GRAM/50 ML IN DEXTROSE (ISO-OSMOT) INTRAVENOUS PIGGYBACK: 1 gram/50 mL | INTRAVENOUS | Qty: 50

## 2015-11-23 MED FILL — CLOTRIMAZOLE 10 MG TROCHE: 10 mg | Qty: 1

## 2015-11-23 MED FILL — POTASSIUM CHLORIDE ER 10 MEQ TABLET,EXTENDED RELEASE(PART/CRYST): 10 meq | ORAL | Qty: 1

## 2015-11-23 MED FILL — NICOTINE 21 MG/24 HR DAILY TRANSDERMAL PATCH: 21 mg/24 hr | TRANSDERMAL | Qty: 1

## 2015-11-23 MED FILL — IPRATROPIUM 0.5 MG-ALBUTEROL 3 MG (2.5 MG BASE)/3 ML NEBULIZATION SOLN: 0.5 mg-3 mg(2.5 mg base)/3 mL | RESPIRATORY_TRACT | Qty: 1

## 2015-11-23 MED FILL — OMEPRAZOLE 20 MG CAPSULE,DELAYED RELEASE: 20 mg | ORAL | Qty: 1

## 2015-11-23 MED FILL — AZITHROMYCIN 500 MG INTRAVENOUS SOLUTION: 500 mg | INTRAVENOUS | Qty: 500

## 2015-11-23 MED FILL — COUMADIN 2.5 MG TABLET: 2.5 mg | ORAL | Qty: 1

## 2015-11-23 MED FILL — METOPROLOL SUCCINATE ER 25 MG TABLET,EXTENDED RELEASE 24 HR: 25 mg | ORAL | Qty: 1

## 2015-11-23 NOTE — H&P (Signed)
History and Physical      Name: Eduardo Khan.  DOB: 1940/12/18 75 y.o.  MRN: [id number]  CSN: [id number]    History:     Chief Complaint:  Altered Mental Status    History of Present Illness:  Eduardo Khan. is a 75 y.o. male with history of pneumonia and COPD who presents for evaluation of dyspnea and weakness. He reports that earlier in the week he was getting a bit wobbly on his feet, then Thursday he helped someone work on their irrigation system out in the heat. He developed a fever to 102. He went back into his RV in the air-conditioning and felt a bit better. The following day was still very weak and went to the emergency department. He was discharged home. Yesterday, he went to the urgent care and was discharged home on Augmentin. He came back in the evening with worsening weakness and a fall. No chest pain. No palpitations.    Past Medical History:  Past Medical History:   Diagnosis Date   . A-fib (CMS/HCC)    . A-fib (CMS/HCC)    . COPD (chronic obstructive pulmonary disease) (CMS/HCC)    . GERD (gastroesophageal reflux disease)    . Hypertension      Past Surgical History:  Past Surgical History:   Procedure Laterality Date   . BACK SURGERY      L3/4 herniated disk 2015?   Marland Kitchen EYE SURGERY      both eyes, cataracts, right eye first of March     Current Medications:  Prior to Admission Medications    Medication Dose & Frequency   amoxicillin-clavulanate (AUGMENTIN) 875-125 mg per tablet Take 1 tablet by mouth 2 times daily for 10 days.   fluticasone (FLONASE) 50 mcg/actuation nasal spray 1 spray by Nasal route Daily.   fluticasone-salmeterol (ADVAIR) 250-50 mcg/dose diskus inhaler Inhale 1 puff into the lungs Twice Daily.   ipratropium-albuterol (DUO-NEB) 0.5 mg-3 mg(2.5 mg base)/3 mL nebulizer solution Take 3 mLs by nebulization every 6 (six) hours as needed for Wheezing.   KLOR-CON M10 10 mEq tablet    nadolol (CORGARD) 40 MG tablet Take 40 mg by mouth daily.   OMEPRAZOLE ORAL Take 20 mg  by mouth daily.    tiotropium (SPIRIVA) 18 mcg inhalation Inhale 18 mcg into the lungs daily.   warfarin (COUMADIN) 5 MG tablet Take 5 mg by mouth every other day. Take 5 mg Monday, Wednesday, Friday, take 2.5mg  Tuesday, Thursday, Saturday and Sunday        Allergies:  Venom-honey bee; Codeine; and Morphine    Family History:  No family history on file.  Social History: Married. Lives in an RV. One pack tobacco daily.    Social History   Substance Use Topics   . Smoking status: Current Every Day Smoker     Packs/day: 1.00   . Smokeless tobacco: Never Used      Comment: exposed   . Alcohol use 0.6 oz/week     1 Cans of beer per week      Comment: occasion     Immunization:    There is no immunization history on file for this patient.  Review of Systems:  Review of Systems   Constitutional: Positive for chills, diaphoresis, fever, malaise/fatigue and weight loss.   HENT: Negative for ear discharge, ear pain, hearing loss, nosebleeds and tinnitus.    Eyes: Negative.    Respiratory: Positive for cough. Negative for hemoptysis.  Cardiovascular: Negative for chest pain, palpitations, orthopnea and PND.   Gastrointestinal: Negative.    Genitourinary: Negative.    Skin: Negative.    Neurological: Positive for dizziness, tremors and weakness. Negative for tingling, sensory change, speech change, focal weakness, seizures, loss of consciousness and headaches.   Endo/Heme/Allergies: Negative for polydipsia.       Physical Exam:     Vital Signs:  Initial Vitals   BP 11/22/15 2008 135/80   Pulse 11/22/15 2008 92   Resp 11/22/15 2008 18   Temp 11/22/15 2008 38.2 ?C (100.7 ?F)   SpO2 11/22/15 2008 98 %     Exam:  Physical Exam   Constitutional: He is oriented to person, place, and time. He appears well-developed and well-nourished. No distress.   Large, well-developed male here with his wife at bedside. He has poor color.   HENT:   Head: Normocephalic.   Mouth/Throat: Oropharyngeal exudate present.   Eyes: Conjunctivae are  normal. Right eye exhibits no discharge. Left eye exhibits no discharge. No scleral icterus.   Neck: No JVD present. No tracheal deviation present. No thyromegaly present.   Cardiovascular: Normal rate.    Irregularly irregular   Pulmonary/Chest: Effort normal. No respiratory distress. He has no wheezes. He has rales.   Rales right lung base.   Abdominal: Soft. Bowel sounds are normal. He exhibits no distension. There is no tenderness.   Lymphadenopathy:     He has no cervical adenopathy.   Neurological: He is alert and oriented to person, place, and time. He exhibits normal muscle tone.   Findings tremor of the hands.   Skin: Skin is warm and dry. No rash noted. No erythema.   Psychiatric: He has a normal mood and affect. His behavior is normal. Thought content normal.       Data:     Labs:  Results for orders placed or performed during the hospital encounter of 11/22/15 (from the past 24 hour(s))   CBC with Auto Differential -STAT    Collection Time: 11/22/15  9:38 PM   Result Value Ref Range    WBC 8.4 4.8 - 10.8 10*3/?L    RBC 5.16 4.70 - 6.10 10*6/?L    Hemoglobin 16.7 12.0 - 18.0 g/dL    HCT 34.7 42.5 - 95.6 %    MCV 96.2 81.0 - 99.0 fL    MCH 32.4 27.0 - 34.0 pg    MCHC 33.7 32.0 - 36.0 g/dL    RDW 38.7 56.4 - 33.2 %    Platelet Count 92 (L) 150 - 400 10*3/?L    MPV 9.5 7.4 - 10.4 fL    Neutrophils % 83 (H) 35 - 70 %    Lymphocytes % 7 (L) 25 - 45 %    Monocytes % 10 0 - 12 %    Eosinophils % 0 0 - 7 %    Basophils % 1 0 - 2 %    Neutrophils, Absolute 7.0 1.6 - 7.3 10*3/?L    Lymphocytes, Absolute 0.6 (L) 1.1 - 4.3 10*3/?L    Monocytes, Absolute 0.8 0.0 - 1.2 10*3/?L    Eosinophils, Absolute 0.0 0.0 - 0.7 10*3/?L    Basophils, Absolute 0.1 0.0 - 0.2 10*3/?L    Differential Type Automated Differential    Comprehensive Metabolic Panel -STAT    Collection Time: 11/22/15  9:38 PM   Result Value Ref Range    Sodium 132 (L) 135 - 143 mmol/L    Potassium 3.6 3.5 - 5.1  mmol/L    Chloride 104 98 - 111 mmol/L    CO2 -  Carbon Dioxide 19.0 (L) 21.0 - 31.0 mmol/L    Glucose 115 (H) 80 - 99 mg/dL    BUN 24 (H) 6 - 23 mg/dL    Creatinine 1.61 0.96 - 1.30 mg/dL    Calcium 8.4 (L) 8.6 - 10.3 mg/dL    AST - Aspartate Aminotransferase 23 10 - 50 IU/L    ALT - Alanine Amino transferase 15 7 - 52 IU/L    Alkaline Phosphatase 85 34 - 104 IU/L    Bilirubin Total 1.4 (H) 0.3 - 1.2 mg/dL    Protein Total 6.3 6.0 - 8.0 g/dL    Albumin 3.6 3.5 - 5.0 g/dL    Globulin 2.7 2.2 - 3.7 g/dL    Albumin/Globulin Ratio 1.3 >0.9    Anion Gap 9.0 3.0 - 11.0 mmol/L    GFR Estimate >60 >=60 mL/min/1.56m*2    GFR Additional Info     Blood Culture -x 2    Collection Time: 11/22/15  9:38 PM   Result Value Ref Range    Blood Culture Result No Growth to Date    Blood Culture -x 2    Collection Time: 11/22/15  9:38 PM   Result Value Ref Range    Blood Culture Result No Growth to Date    Lactic Acid (ED) -STAT    Collection Time: 11/22/15  9:38 PM   Result Value Ref Range    Lactic Acid ED 1.3 0.5 - 2.2 mmol/L   PT & PTT -STAT    Collection Time: 11/22/15  9:49 PM   Result Value Ref Range    Protime 27.3 (H) 9.9 - 13.4 Seconds    APTT 38 (H) 23 - 36 Seconds    INR 2.3 (H) 0.9 - 1.2   Urinalysis with Reflex Microscopic -Once    Collection Time: 11/22/15  9:59 PM   Result Value Ref Range    Color, Urine Amber (A) Yellow, Straw    Clarity, Urine Hazy (A) Clear    Glucose, Urine Negative Negative mg/dL    Bilirubin, Urine Negative Negative    Ketones, Urine Trace (A) Negative mg/dL    Specific Gravity, Urine 1.026 1.003 - 1.030    Blood, Urine 1+ (A) Negative    pH, UA 5.0 5.0 - 7.5    Protein, Urine 2+ (A) Negative mg/dL    Urobilinogen, Urine 4.0 (A) Normal mg/dL    Nitrite, Urine Negative Negative    Leukocyte Esterase, Urine Negative Negative    Ascorbic Acid, Urine Negative Negative    White Blood Cell Microscopic Numeric 1 <=9 /HPF    RBC, Urine 2 <=5 /HPF    Bacteria, Urine None Seen None Seen /HPF    Squamous Epithelial, Urine <1 <=5 /HPF    Amorphous Crystals,  Urine Rare None Seen, Rare, Few /HPF    Mucous, Urine 1+ 0 /LPF   Blood Gas-Arterial -Once    Collection Time: 11/22/15 11:30 PM   Result Value Ref Range    ABG PH (ASA) 7.511 (H) 7.350 - 7.450    PCO2 Arterial Blood Gas 27.6 (L) 35.0 - 45.0 mmHg    pO2 Arterial Blood Gas 55.3 (L) 80.0 - 100.0 mmHg    HCO3 Arterial Blood Gas 21.6 (L) 22.0 - 26.0 mmol/L    Base Excess 0.2 -2.0 - 2.0 mmol/L    tHb 16.8 12.0 - 18.0 g/dL    E4VW 09.8 (L)  94.0 - 97.0 %    Carboxyhemoglobin 2.7 (H) 0.0 - 1.5 %    Methemoglobin 0.5 0.0 - 1.0 %    O2 Saturation 91.9 (L) 92.0 - 98.5 %    pH (Temp Corrected) - Arterial      pCO2 (Temp Corrected) - Arterial  mmHg    pO2 (Temp Corrected) - Arterial  mmHg    Allen's Test Positive     Sample Type Blood     Specimen Site L. Radial     O2 Delivery Device Nasal Cannula     O2 Liter Flow 2.00 L/min    Drawn By LL     Reported By LL     Reported to [facility]     Results called/Readback Hand Delivered     Puncture Attempts 1     Time Analyzed [id number]     Analyzed By Kerin Perna           Imaging for last 24 hours:  X-ray Chest Pa And Lateral    Result Date: 11/22/2015  IMPRESSION:  Ill-defined infiltrate in the RIGHT lower lobe, most likely pneumonia. Chronic obstructive pulmonary disease.       Special Studies:  None    Assessment:     Diagnoses:  Active Hospital Problems    Diagnosis SNOMED CT(R) Date Noted   . Pneumonia PNEUMONIA 11/23/2015   . Oral thrush CANDIDIASIS OF MOUTH 11/23/2015   . Community acquired bacterial pneumonia COMMUNITY ACQUIRED PNEUMONIA 07/06/2015     Class: Acute       Plan:   1. Continue antibiotics  2. Mycelex troches  3. He is dehydrated, taking fluids poorly, and I will be starting him on some antibiotics.  4. Continue routine dose warfarin and monitor daily.  5. I will review with Dr. Mayford Knife and anticipate he will be seeing the patient tomorrow.  6. Plan reviewed with patient and his wife.    Kyung Bacca  11/23/2015  12:05 PM

## 2015-11-23 NOTE — Plan of Care (Signed)
Problem: Knowledge Deficit  Goal: Patient/family/caregiver demonstrates understanding of disease process, treatment plan, medications, and discharge instructions  Complete learning assessment and assess knowledge base.  Outcome: Not Progressing  Pt has poor memory

## 2015-11-23 NOTE — ED Provider Notes (Addendum)
[facility] [place] EMERGENCY DEPARTMENT VISIT NOTE     History and Physical     Name: Eduardo Khan.  DOB: [birth year] 75 y.o.  MRN: [id number]  CSN: [id number]    HISTORY:     CHIEF COMPLAINT    Sounding cough, weakness and fevers       HPI    Eduardo Khan. is a 75 y.o. male history of atrial fibrillation on Coumadin. Patient also suffers from COPD and uses oxygen at night only. Patient was outdoors 2 days ago and after coming in side was found to be weakened and fatigued with a wet sounding cough. Patient was found to be febrile that night with a temperature of 103. Family initially came to the emergency department and blood work was done but due to significant weight had returned home. Today they were seen in urgent care and a chest x-ray was performed did not reveal evidence of pneumonia or family. Patient was started on Augmentin and discharged home. Today he has had increased weakness and increased oxygen demand and per wife episodes of confusion. At this time patient is not confused. Symptoms are similar to the episode of pneumonia the patient has experienced proximally 5 months ago.  His reports of fall at home without head or extremity trauma and wife does not feel that she can care for him at home at this time due to his confusion generalized weakness and care requirements.    REVIEW OF SYSTEMS:  Please see HPI.  General: Fevers and chills are reported  Cardiac: No chest pain or palpitations reported  Pulmonary: Wet sounding cough increased O2 use is reported  Abdominal:  No nausea, vomiting, diarrhea or abdominal pain reported  Neuro:  No headache, focal weakness or sensory changes reported    PAST MEDICAL HISTORY    Past Medical History:   Diagnosis Date   . A-fib (CMS/HCC)    . A-fib (CMS/HCC)    . COPD (chronic obstructive pulmonary disease) (CMS/HCC)    . GERD (gastroesophageal reflux disease)    . Hypertension        SURGICAL HISTORY    Past Surgical History:   Procedure Laterality  Date   . BACK SURGERY      L3/4 herniated disk 2015?   Marland Kitchen EYE SURGERY      both eyes, cataracts, right eye first of March       CURRENT MEDICATIONS    Previous Medications    AMOXICILLIN-CLAVULANATE (AUGMENTIN) 875-125 MG PER TABLET    Take 1 tablet by mouth 2 times daily for 10 days.    FLUTICASONE (FLONASE) 50 MCG/ACTUATION NASAL SPRAY    1 spray by Nasal route Daily.    FLUTICASONE-SALMETEROL (ADVAIR) 250-50 MCG/DOSE DISKUS INHALER    Inhale 1 puff into the lungs Twice Daily.    IPRATROPIUM-ALBUTEROL (DUO-NEB) 0.5 MG-3 MG(2.5 MG BASE)/3 ML NEBULIZER SOLUTION    Take 3 mLs by nebulization every 6 (six) hours as needed for Wheezing.    KLOR-CON M10 10 MEQ TABLET        NADOLOL (CORGARD) 40 MG TABLET    Take 40 mg by mouth daily.    OMEPRAZOLE ORAL    Take 20 mg by mouth daily.     TIOTROPIUM (SPIRIVA) 18 MCG INHALATION    Inhale 18 mcg into the lungs daily.    WARFARIN (COUMADIN) 5 MG TABLET    Take 5 mg by mouth daily. 2.5mg  every other day  ALLERGIES    Venom-honey bee; Codeine; and Morphine    SOCIAL HISTORY    Social History   Substance Use Topics   . Smoking status: Current Every Day Smoker     Packs/day: 1.00   . Smokeless tobacco: Never Used      Comment: exposed   . Alcohol use 0.6 oz/week     1 Cans of beer per week      Comment: occasion         PHYSICAL EXAM:   INITIAL VITAL SIGNS:    Initial Vitals   BP 11/22/15 2008 135/80   Pulse 11/22/15 2008 92   Resp 11/22/15 2008 18   Temp 11/22/15 2008 38.2 ?C (100.7 ?F)   SpO2 11/22/15 2008 98 %     General:  Appears stated age.  No distress.    HEENT:  Normocephalic, Atraumatic, PERRLA. Oropharynx is moist with erythema and evidence of oral thrush. Patient does use an Advair inhaler and family reports that he is not compliant with rinsing his mouth out after use  Neck: Supple without rigidity, no tenderness, no appreciable mass and full ROM.   Chest:  Normal respiratory effort, that accessory muscle use. Diminished breath sounds noted in right lower lung  field. Wet sounding cough.  Cardiovascular:  Regular rate and rhythm, no murmurs or rubs noted, 2+ radial pulses bilaterally.   GI:  Soft, non-tender, and non distended with normal bowel sounds noted.  Extremities: Warm, well perfused with full ROM.  Calves symmetrical and non-tender. No edema.  Skin:  No rashes or acute injury noted.   Neurologic:  Alert & oriented x 3, nonfocal exam. No evidence of focal weakness to bilateral upper arms or legs there is no ataxia noted and strength is symmetric.       ASSESSMENT AND PLAN:  Patient with increased O2 demand wet sounding cough and increased weakness consistent with developing pneumonia. Patient has had fevers at home.  Patient does have COPD and continues to smoke. Will review previous chest x-ray performed this morning but suspect underlying pneumonia     RESULTS :       LABS    Results for orders placed or performed during the hospital encounter of 11/22/15 (from the past 24 hour(s))   CBC with Auto Differential -STAT    Collection Time: 11/22/15  9:38 PM   Result Value Ref Range    WBC 8.4 4.8 - 10.8 10*3/?L    RBC 5.16 4.70 - 6.10 10*6/?L    Hemoglobin 16.7 12.0 - 18.0 g/dL    HCT 62.1 30.8 - 65.7 %    MCV 96.2 81.0 - 99.0 fL    MCH 32.4 27.0 - 34.0 pg    MCHC 33.7 32.0 - 36.0 g/dL    RDW 84.6 96.2 - 95.2 %    Platelet Count 92 (L) 150 - 400 10*3/?L    MPV 9.5 7.4 - 10.4 fL    Neutrophils % 83 (H) 35 - 70 %    Lymphocytes % 7 (L) 25 - 45 %    Monocytes % 10 0 - 12 %    Eosinophils % 0 0 - 7 %    Basophils % 1 0 - 2 %    Neutrophils, Absolute 7.0 1.6 - 7.3 10*3/?L    Lymphocytes, Absolute 0.6 (L) 1.1 - 4.3 10*3/?L    Monocytes, Absolute 0.8 0.0 - 1.2 10*3/?L    Eosinophils, Absolute 0.0 0.0 - 0.7 10*3/?L    Basophils,  Absolute 0.1 0.0 - 0.2 10*3/?L    Differential Type Automated Differential    Comprehensive Metabolic Panel -STAT    Collection Time: 11/22/15  9:38 PM   Result Value Ref Range    Sodium 132 (L) 135 - 143 mmol/L    Potassium 3.6 3.5 - 5.1 mmol/L     Chloride 104 98 - 111 mmol/L    CO2 - Carbon Dioxide 19.0 (L) 21.0 - 31.0 mmol/L    Glucose 115 (H) 80 - 99 mg/dL    BUN 24 (H) 6 - 23 mg/dL    Creatinine 1.61 0.96 - 1.30 mg/dL    Calcium 8.4 (L) 8.6 - 10.3 mg/dL    AST - Aspartate Aminotransferase 23 10 - 50 IU/L    ALT - Alanine Amino transferase 15 7 - 52 IU/L    Alkaline Phosphatase 85 34 - 104 IU/L    Bilirubin Total 1.4 (H) 0.3 - 1.2 mg/dL    Protein Total 6.3 6.0 - 8.0 g/dL    Albumin 3.6 3.5 - 5.0 g/dL    Globulin 2.7 2.2 - 3.7 g/dL    Albumin/Globulin Ratio 1.3 >0.9    Anion Gap 9.0 3.0 - 11.0 mmol/L    GFR Estimate >60 >=60 mL/min/1.29m*2    GFR Additional Info     Blood Culture -x 2    Collection Time: 11/22/15  9:38 PM   Result Value Ref Range    Blood Culture Result No Growth to Date    Blood Culture -x 2    Collection Time: 11/22/15  9:38 PM   Result Value Ref Range    Blood Culture Result No Growth to Date    Lactic Acid (ED) -STAT    Collection Time: 11/22/15  9:38 PM   Result Value Ref Range    Lactic Acid ED 1.3 0.5 - 2.2 mmol/L   PT & PTT -STAT    Collection Time: 11/22/15  9:49 PM   Result Value Ref Range    Protime 27.3 (H) 9.9 - 13.4 Seconds    APTT 38 (H) 23 - 36 Seconds    INR 2.3 (H) 0.9 - 1.2   Urinalysis with Reflex Microscopic -Once    Collection Time: 11/22/15  9:59 PM   Result Value Ref Range    Color, Urine Amber (A) Yellow, Straw    Clarity, Urine Hazy (A) Clear    Glucose, Urine Negative Negative mg/dL    Bilirubin, Urine Negative Negative    Ketones, Urine Trace (A) Negative mg/dL    Specific Gravity, Urine 1.026 1.003 - 1.030    Blood, Urine 1+ (A) Negative    pH, UA 5.0 5.0 - 7.5    Protein, Urine 2+ (A) Negative mg/dL    Urobilinogen, Urine 4.0 (A) Normal mg/dL    Nitrite, Urine Negative Negative    Leukocyte Esterase, Urine Negative Negative    Ascorbic Acid, Urine Negative Negative    White Blood Cell Microscopic Numeric 1 <=9 /HPF    RBC, Urine 2 <=5 /HPF    Bacteria, Urine None Seen None Seen /HPF    Squamous Epithelial,  Urine <1 <=5 /HPF    Amorphous Crystals, Urine Rare None Seen, Rare, Few /HPF    Mucous, Urine 1+ 0 /LPF   Blood Gas-Arterial -Once    Collection Time: 11/22/15 11:30 PM   Result Value Ref Range    ABG PH (ASA) 7.511 (H) 7.350 - 7.450    PCO2 Arterial Blood Gas 27.6 (L) 35.0 -  45.0 mmHg    pO2 Arterial Blood Gas 55.3 (L) 80.0 - 100.0 mmHg    HCO3 Arterial Blood Gas 21.6 (L) 22.0 - 26.0 mmol/L    Base Excess 0.2 -2.0 - 2.0 mmol/L    tHb 16.8 12.0 - 18.0 g/dL    G9FA 21.3 (L) 08.6 - 97.0 %    Carboxyhemoglobin 2.7 (H) 0.0 - 1.5 %    Methemoglobin 0.5 0.0 - 1.0 %    O2 Saturation 91.9 (L) 92.0 - 98.5 %    pH (Temp Corrected) - Arterial      pCO2 (Temp Corrected) - Arterial  mmHg    pO2 (Temp Corrected) - Arterial  mmHg    Allen's Test Positive     Sample Type Blood     Specimen Site L. Radial     O2 Delivery Device Nasal Cannula     O2 Liter Flow 2.00 L/min    Drawn By LL     Reported By LL     Reported to [facility]     Results called/Readback Hand Delivered     Puncture Attempts 1     Time Analyzed [id number]     Analyzed By Kerin Perna        RADIOLOGY/PROCEDURES    No orders to display        ED COURSE & MEDICAL DECISION MAKING    Nursing note and above data reviewed.     Patient with clinical course and examination consistent with developing right lower lobe pneumonia. Review of chest x-ray performed earlier today shows evidence of a developing right lower lobe infiltrate. Patient is requiring additional oxygen use and it with his fevers weakness and fatigue I feel that IV antibiotics and hospitalization will be required. Patient has not been hospitalized per family and the last 3 months and evidence of a hospital-acquired pneumonia is not present. Case reviewed with Dr. Su Hilt who will admit the patient and provide further care and reevaluation in the morning.    DECISION TO ADMIT:   8/5 2159          FINAL IMPRESSION         SNOMED CT(R)   1. Pneumonia  PNEUMONIA           **This document was created with the  assistance of voice-to-text technology. Effort has been made to minimize transcription errors. Please allow for homonyms and other similar transcription errors, such as she in place of he and vice versa.Karleen Hampshire, MD  11/23/15 3244       Karleen Hampshire, MD  11/23/15 [phone]

## 2015-11-23 NOTE — Progress Notes (Signed)
PHARMACIST PROGRESS NOTE     Admission Medication History Follow-Up and Revision of Epic Home Medication List    SUBJECTIVE/OBJECTIVE:     Summary: Eduardo HARRY Renovato JR. is a 75 y.o. Male     I spoke with the patient and explained current pharmacist plan of care.    ASSESSMENT/PLAN:     Patient verbalized understanding of the following plan of care:     Source(s) of Information Used:  Provider    Pharmacist actions taken to correct the medication list that may impact current inpatient orders:  ? Updated Warfarin to 5mg  PO Mon/Wed/Fri and 2.5mg  PO Sun/Tue/Thur/Sat    Method utilized to inform the responsible provider:  ? Progress Note      Signed by: Bishop Limbo, PharmD

## 2015-11-23 NOTE — ED Notes (Addendum)
Pt notified of transport, resting comfortably, IV abx running, 2L NC

## 2015-11-23 NOTE — ED Notes (Signed)
Call [phone] Bjorn Loser (daughter), if anything is  Needed through night

## 2015-11-24 LAB — BASIC METABOLIC PANEL
Anion Gap: 7 mmol/L (ref 3.0–11.0)
BUN: 16 mg/dL (ref 6–23)
CO2 - Carbon Dioxide: 28 mmol/L (ref 21.0–31.0)
Calcium: 8.4 mg/dL — ABNORMAL LOW (ref 8.6–10.3)
Chloride: 103 mmol/L (ref 98–111)
Creatinine: 0.83 mg/dL (ref 0.65–1.30)
GFR Estimate: >60 mL/min/{1.73_m2} (ref >=60)
Glucose: 119 mg/dL — ABNORMAL HIGH (ref 80–99)
Potassium: 3.3 mmol/L — ABNORMAL LOW (ref 3.5–5.1)
Sodium: 138 mmol/L (ref 135–143)

## 2015-11-24 LAB — PROTIME-INR
INR: 2.5 — ABNORMAL HIGH (ref 0.9–1.2)
Protime: 30.3 Seconds — ABNORMAL HIGH (ref 9.9–13.4)

## 2015-11-24 MED ORDER — potassium chloride SA (K-DUR,KLOR-CON) CR tablet 10 mEq
10 | Freq: Once | ORAL | Status: AC
Start: 2015-11-24 — End: 2015-11-24
  Administered 2015-11-24: 10 meq via ORAL

## 2015-11-24 MED FILL — POTASSIUM CHLORIDE ER 10 MEQ TABLET,EXTENDED RELEASE(PART/CRYST): 10 meq | ORAL | Qty: 1

## 2015-11-24 MED FILL — CLOTRIMAZOLE 10 MG TROCHE: 10 mg | Qty: 1

## 2015-11-24 MED FILL — OMEPRAZOLE 20 MG CAPSULE,DELAYED RELEASE: 20 mg | ORAL | Qty: 1

## 2015-11-24 MED FILL — METOPROLOL SUCCINATE ER 25 MG TABLET,EXTENDED RELEASE 24 HR: 25 mg | ORAL | Qty: 1

## 2015-11-24 MED FILL — DEXTROSE 5 % AND 0.9 % SODIUM CHLORIDE INTRAVENOUS SOLUTION: INTRAVENOUS | Qty: 1000

## 2015-11-24 MED FILL — ACETAMINOPHEN 325 MG TABLET: 325 mg | ORAL | Qty: 2

## 2015-11-24 MED FILL — BUDESONIDE 0.5 MG/2 ML SUSPENSION FOR NEBULIZATION: 0.5 | RESPIRATORY_TRACT | Qty: 2

## 2015-11-24 MED FILL — BROVANA 15 MCG/2 ML SOLUTION FOR NEBULIZATION: 15 mcg/2 mL | RESPIRATORY_TRACT | Qty: 2

## 2015-11-24 MED FILL — NICOTINE 21 MG/24 HR DAILY TRANSDERMAL PATCH: 21 mg/24 hr | TRANSDERMAL | Qty: 1

## 2015-11-24 MED FILL — IPRATROPIUM 0.5 MG-ALBUTEROL 3 MG (2.5 MG BASE)/3 ML NEBULIZATION SOLN: 0.5 mg-3 mg(2.5 mg base)/3 mL | RESPIRATORY_TRACT | Qty: 1

## 2015-11-24 MED FILL — COUMADIN 5 MG TABLET: 5 mg | ORAL | Qty: 1

## 2015-11-24 MED FILL — CEFTRIAXONE 1 GRAM/50 ML IN DEXTROSE (ISO-OSMOT) INTRAVENOUS PIGGYBACK: 1 gram/50 mL | INTRAVENOUS | Qty: 50

## 2015-11-24 MED FILL — AZITHROMYCIN 500 MG INTRAVENOUS SOLUTION: 500 mg | INTRAVENOUS | Qty: 500

## 2015-11-24 NOTE — Home Health (Signed)
 Health System     Order and Certification for Home Care Services      Patient Name: Eduardo Khan.   MRN: [id number]   DOB: 19-Dec-1940  Primary Care Physician: Levander Campion, MD        Name of Physician who will continue orders and certifications: Dr. Mayford Knife    Certification of Encounter  I certify that this patient is under my care and that I, or a nurse practitioner or physician's assistant working with me, had a face-to-face encounter that meets the physician face- to-face encounter requirements with this patient on 11/24/2015      The encounter with this patient was in whole or in part for the following diagnosis(es) and problems, which is the primary reason(s) I am ordering home health care:    COPD and Pneumonia      Clinical Findings that Support Medical Necessity of Home Health Services  I have initiated the establishment of a plan of care and referred this patient to a community physician who will follow and periodically review the plan of care.  Listed below are my clinical findings and how they support the patient's need for each skilled service(s).    Skilled Nursing: Disease process and Assessment and/or teaching  Physical Therapy: Evaluation and treatment, Gait training, Impaired balance/ fall risk, Strengthening and Safety evaluation      Clinical Findings that Support Homebound Status  This patient is confined to the home (i.e. absences from home are for medical reasons or religious services or infrequently or of short duration when for other reasons)  because an illness or injury renders him/her normally unable to leave home except with the assistance of another person and/or the aid of a supportive device as well as for the following reasons :    Inability to safely ambulate without assisted device and/or personal assistance, Inability to stand or walk for more than short distances, Requires service of another person to leave residence and Frequent falls/poor balance/unsteady  gait    Authorized and Electronically Signed: Pearletha Furl. Mayford Knife, MD    Date:  11/25/15

## 2015-11-24 NOTE — Other (Signed)
Physical Therapy Evaluation    Patient name:  Eduardo Khan.  Date:  11/24/2015    ASSESSMENT    Pt is generally very weak and presents with a tremor.  Bed Mobility Min A.  Transfers Min A with FWW.  Ambulation with FWW and CGA 150 feet.  Pt required 3 L O2 for ambulation to maintain 91%.  He reports using home O2 (2L ) at night only.    He will benefit from OT Evaluation.    DME Needs: FWW or [facility]    DME Has: none    Rehab Potential:  Good    Discharge Recommendations:  Home with [facility] PT/OT       PATIENT INFORMATION    Primary Diagnosis:    Patient Active Problem List   Diagnosis SNOMED CT(R)   . Community acquired bacterial pneumonia COMMUNITY ACQUIRED PNEUMONIA   . COPD (chronic obstructive pulmonary disease) (CMS/HCC) CHRONIC OBSTRUCTIVE LUNG DISEASE   . A-fib (CMS/HCC) ATRIAL FIBRILLATION   . History of spinal surgery H/O SPINAL SURGERY   . Lumbosacral radiculopathy LUMBOSACRAL RADICULOPATHY   . Onychomycosis due to dermatophyte ONYCHOMYCOSIS DUE TO DERMATOPHYTE   . Thrombocytopenia (CMS/HCC) PLATELET COUNT BELOW REFERENCE RANGE   . COPD with exacerbation (CMS/HCC) ACUTE EXACERBATION OF CHRONIC OBSTRUCTIVE AIRWAYS DISEASE   . Oral thrush CANDIDIASIS OF MOUTH       Pertinent Medical/Surgical Hx:    Past Medical History:   Diagnosis Date   . A-fib (CMS/HCC)    . A-fib (CMS/HCC)    . COPD (chronic obstructive pulmonary disease) (CMS/HCC)    . GERD (gastroesophageal reflux disease)    . Hypertension      Past Surgical History:   Procedure Laterality Date   . BACK SURGERY      L3/4 herniated disk 2015?   Marland Kitchen EYE SURGERY      both eyes, cataracts, right eye first of March       Precautions:  Monitor O2 Sats; High Fall Risk due to generalized weakness.     Hx of Onset:  75 y.o. male admitted for AMS and referred to PT for mobility assessment and discharge planning recommendations.    PLOF:  Independent without AD short community distances    Social Situation:  Lives with his wife in [place]    Home Accessibility/ Stairs:    RV with 4 steps and railing       SUBJECTIVE    Pt was agreeable to PT Evaluation.    Pain: Pain does not limit patient's functional ability at present.       OBJECTIVE    Appliances:  Tele, IV, 2.5 L O2    Observation:  Frail; tremulous    Mental Status/LOC/Orientation:  A&Ox4    Sensation:  Functional     Strength:  4-/5    ROM:  Functional     Balance:  Sit Static: G  Sit Dynamic: F  Stand Static: F  Stand Dynamic: F    Bed Mobility: Min A      Transfers:   Min A with FWW    Gait:   [name] walked 150 feet with FWW and CGA.    Stairs: NT    Endurance:  Fair    Vitals:     Sp02 =  91% on 3 liter(s) oxygen with exertion    Today's Treatment:    Evaluation Completed and Goals addressed.  Patient left in room in Chair with call light.  Phone left  within reach.  Exit alarm on.  Instructed in benefits of increased out of bed time.  Encouraged patient to be out of bed for 2 hours.      PLAN    Interventions:    Therapeutic Exercise       Bed Mobility Camera operator  DME/Equipment Assessment     Frequency:   1 X/day (M-F)   1 X/day (Saturday or Sunday)    Duration:  LOS, or as indicated      Therapist Goals (by discharge):    Patient/Caregiver GOALS reviewed and integrated with rehab treatment plan:  Multidisciplinary Problems (Active)        Problem: IP General Goal List - 1    Goal Priority Disciplines Outcome   Bed Mobility     PT    Description:  Pt. to perform bed mobility independently.    Transfers     PT    Description:  Patient to perform all functional transfers independently.    Ambulation - Levels     PT    Description:  Patient to ambulate independently x 350 feet with appropriate assistive device.    Ambulation - Stairs     PT    Description:  Pt. to ascend/descend stairs independently, as per discharge environment.    Caregiver Training     PT    Description:  Patient/caregiver training will be  provided, as needed to achieve goals.             Problem: Patient/Family Goal    Goal Priority Disciplines Outcome   Home     PT    Description:  Pt. wants to return to previous living situation.                Patient/Family Goal:  Home       G-Codes/PSFS/FIM Scores: (if applicable)     % of Disability G Code FIM  Description   0%??????????  CH 7 Independent   1-19% CI 6 Device or extra time needed   20-39% CJ 5 Supervision/setup/cues must walk 50 ft independently may use device   40-59% CK 4 Min assist or 1 limb help must walk minimum of 150 ft   60-79% CL 3 Mod assist or 2 limb help must walk minimum of 150 ft   80-99% CM 2 Max assist or walks between 50 - 150 ft   100% CN 1 Total assist / mechanical or walks less than 50 ft                         Therapist/License #: Sheika Coutts W. Eaden Hettinger, PT    Start Time: 0900 /Stop Time: 0945    15 Evaluation (16109)  15 Therapeutic Activities (97530)  15 Gait Training (60454)

## 2015-11-24 NOTE — Progress Notes (Signed)
History  FU visit for Community acquired bacterial pneumonia  Feeling slightly better, still very weak     ROS  Const: no fevers      Medications  . arformoterol  15 mcg Nebulization 2 times daily   . cefTRIAXone (ROCEPHIN) IV  1 g Intravenous Q24H    And   . azithromycin  500 mg Intravenous Q24H   . budesonide  0.5 mg Nebulization Q12H   . clotrimazole  10 mg Oral 5x Daily   . ipratropium-albuterol  3 mL Nebulization 3 times daily   . metoprolol succinate  25 mg Oral Daily   . nicotine  1 patch Transdermal Daily   . omeprazole  20 mg Oral Daily   . potassium chloride SA  10 mEq Oral Daily   . potassium chloride SA  10 mEq Oral Once   . warfarin  5 mg Oral Once per day on Mon Wed Fri    And   . warfarin  2.5 mg Oral Once per day on Sun Tue Thu Sat     acetaminophen, ipratropium-albuterol    Exam  Vitals:   Vitals:    11/24/15 0711   BP: (!) 142/91   Pulse: 95   Resp: 18   Temp: 37.3 ?C (99.1 ?F)   SpO2: 94%     Last 24 hr vital signs reviewed  I/O last 3 completed shifts:  In: 670 [P.O.:670]  Out: 1170 [Urine:1170]  I/O this shift:  In: 100 [P.O.:100]  Out: 300 [Urine:300]    Const: alert, NAD  Lungs: few crackles right base, chronically mildly inc effort  Heart: irreg , no murmur    Results for orders placed or performed during the hospital encounter of 11/22/15 (from the past 24 hour(s))   Protime Panel -Daily    Collection Time: 11/24/15  5:11 AM   Result Value Ref Range    Protime 30.3 (H) 9.9 - 13.4 Seconds    INR 2.5 (H) 0.9 - 1.2   Basic Metabolic Panel -AM Draw    Collection Time: 11/24/15  5:11 AM   Result Value Ref Range    Sodium 138 135 - 143 mmol/L    Potassium 3.3 (L) 3.5 - 5.1 mmol/L    Chloride 103 98 - 111 mmol/L    CO2 - Carbon Dioxide 28.0 21.0 - 31.0 mmol/L    Glucose 119 (H) 80 - 99 mg/dL    BUN 16 6 - 23 mg/dL    Creatinine 1.61 0.96 - 1.30 mg/dL    Calcium 8.4 (L) 8.6 - 10.3 mg/dL    Anion Gap 7.0 3.0 - 11.0 mmol/L    GFR Estimate >60 >=60 mL/min/1.59m*2    GFR Additional Info       Blood  culture neg     Assessment and Plan  Active Hospital Problems    *Community acquired bacterial pneumonia      Oral thrush      COPD (chronic obstructive pulmonary disease) (CMS/HCC)    continue IV abx  Saline lock IV as able to take PO.   Another day at least, get some PT.    Length of stay : 1 days      Hailey Miles A. Mayford Knife, M.D.

## 2015-11-24 NOTE — Discharge Planning (AHS/AVS) (Signed)
Per Discharge Planner, Marylou Flesher, RN  Referral sent to [facility] [phone] .  Les Pou  Case Management Specialist   11/24/2015 11:50 AM

## 2015-11-24 NOTE — Progress Notes (Signed)
RESPIRATORY ASSESSMENT    SUBJECTIVE:  Pt admitted for pneumonia     OBJECTIVE:  Current Vital Signs:   C/S:  diminished   HR:  90   RR:  18   SpO2:  93%    Oxygenation Status: room air   Cough: none     ASSESSMENT:  Benefits from bronchodilators while here    PLAN:  Continue with MD ordered treatment plan     GOAL(S):  Medication:   Maintain a patent airway  Mobilize secretions  Decrease work of breathing  Minimize or eliminate adventitious breath sounds  Maximize airflow    Pulmonary/Bronchial Hygiene:  Maintain a patent airway  Improve mucocilliary clearance  Prevent or correct atelectasis    Supplemental Oxygen:   Maintain SpO2 >90%  Normalize and maintain an adequate SpO2/PaO2 appropriate for patient clinical situation    E.S.  Tressia Miners

## 2015-11-24 NOTE — Other (Signed)
Occupational Therapy     OT Evaluation/ Plan of Care Note  Patient name: Eduardo Khan.  Date:  11/24/2015    ASSESSMENT  Pt. Has tremors and needs built up handle utensils.  The built up foam works to lessen the tremors. Had pt use a weighted utensil with the head of the fork that turns, but he did not like it- are you kidding me. pt. Does not want to use wt. Fork. it looks ridiculous.  Did not want to finish eating.  I am not hungry.  Pt. O2 hose was in nares, and sats were 96%. The oxygen was not flowing at the time.      Rehab Potential:   fair    Discharge Recommendations: SNF for rehab as pt. Spouse says that she cannot care for pt at home VS.  HH OT/PT      PATIENT INFORMATION  Primary Diagnosis:     Patient Active Problem List   Diagnosis SNOMED CT(R)   . Community acquired bacterial pneumonia COMMUNITY ACQUIRED PNEUMONIA   . COPD (chronic obstructive pulmonary disease) (CMS/HCC) CHRONIC OBSTRUCTIVE LUNG DISEASE   . A-fib (CMS/HCC) ATRIAL FIBRILLATION   . History of spinal surgery H/O SPINAL SURGERY   . Lumbosacral radiculopathy LUMBOSACRAL RADICULOPATHY   . Onychomycosis due to dermatophyte ONYCHOMYCOSIS DUE TO DERMATOPHYTE   . Thrombocytopenia (CMS/HCC) PLATELET COUNT BELOW REFERENCE RANGE   . COPD with exacerbation (CMS/HCC) ACUTE EXACERBATION OF CHRONIC OBSTRUCTIVE AIRWAYS DISEASE   . Oral thrush CANDIDIASIS OF MOUTH       Surgery:       Days Post surgery:       Pertinent Medical/Surgical Hx:    Past Medical History:   Diagnosis Date   . A-fib (CMS/HCC)    . A-fib (CMS/HCC)    . COPD (chronic obstructive pulmonary disease) (CMS/HCC)    . GERD (gastroesophageal reflux disease)    . Hypertension      Past Surgical History:   Procedure Laterality Date   . BACK SURGERY      L3/4 herniated disk 2015?   Marland Kitchen EYE SURGERY      both eyes, cataracts, right eye first of March       Precautions:  Fall risk     Hx of Onset:  Pt. Is admitted for weakness and AMS.      PLOF:  Pt. Lives with his spouse in a  [facility]. He was ind for all immediate self care PTA did not use AD PTA    Social Situation:  Pt. Has 2 dtrs. One lives in [place]    Home Accessibility/ Stairs:   RV with 4 steps and a rail     Equipment:  none      SUBJECTIVE    Pt. Is willing to work with OT.  Asked pt. If he is able to care for self at home.  He says that he can.  Does not have any AE at home for feeding self.       Pain: Pain does not limit patient's functional ability at present.     Pre Treatment Pain Level:  0/10   Pain Level during activity:  0/10   Post Treatment Pain Level:  0/10       OBJECTIVE  Appliances:  IV site. No IV running    Observation:  Pt. Is a tall thin male sitting on EOB eating a hamburger with one hand.  Pt. Has a grayish purple Hue to his face  Mental Status/LOC/Orientation:  AxOx3.  Pt. Did not know what month it is.  What month is it? Hell, I don't know.      Vision/Perception:  L eye has had surgery      Sensation:  Functional    Hand Dominance:  R    ROM:  Functional     Strength:  Functional  For self care and transfers    Coordination:  Pt. With tremors.  He reports more than normal     Balance:  Sit Static: g  Sit Dynamic: g  Stand Static: F  Stand Dynamic: F    Bed Mobility: CGA      Transfers:  Min a to raise hips of bed to scoot    Functional Mobility:     Functional Limitations: intermittent confusion    ADL's: tremors make it difficult for pt to hold burger with one hand.  When suggested to hold with Both hands pt says  I don't need to.    Endurance:  fair    Vitals: No significant changes in vital signs noted.  Sats at 96% on RA in unsupported sitting       Today's Treatment:    Educ. On purpose and use of Built up utensils due to increased tremors    PLAN    Interventions:    ADL Training     Therapeutic Exercise     Functional Mobility      Patient/Caregiver Training     Muscle Re-education    DME information    Needed devices addressed    Frequency:  5X/week  Mon-Fri.  Duration:  Inpatient stay or when  goals met    Therapist Goals (by discharge)  Multidisciplinary Problems (Active)        Problem: ADLs    Goal Priority Disciplines Outcome   ADL Self Feeding with Adaptive Utensils     OT    Description:  Patient to be able to perform self feeding using adaptive utensils with set up assist.           Problem: Cognitive Skills    Goal Priority Disciplines Outcome   Other     OT    Description:  OT to administer the SLUMS mental status exam              Patient/Family Goal: spouse states she cannot care for him at home at this time.    Therapist/License #:  Gavin Pound Lowry Bala  /     Date: 11/24/2015    First session times:   Start Time:   1145  Stop Time:  1230

## 2015-11-24 NOTE — Discharge Planning (AHS/AVS) (Addendum)
DISCHARGE PLANNING ASSESSMENT     Primary Care Physician: Levander Campion, MD Confirmed Pt's PCP and Contact info:   Yes     Plan: Home with [facility]    Patient/Family Goal: Home  Family or Caregiver who is to be included in care planning: Wife Rose  ASSESSMENT   Goals of Care conversation:  Addressed at this Admission: Yes       Current General Observations/Barriers:A/O but HOH    Prior Level of Function  Potential Risk Factors: Change in care needs, Readmission/diagnosis CHF/COPD/AMI/Pneum  Home Environment: Single level, Entry level steps, Entry railing  Number of Steps: 4  Mentation: Oriented  Who assists with ADLs?: independant  Mobility: Ambulatory, Independent  DME: Walker, Oxygen     Support Systems/Services (Current)  Living Arrangements: Spouse/significant other  Type of Residence: Private residence  Support Systems: Spouse/significant other, Children           INTERVENTIONS   Anticipated additional equipment needs: none  Do you anticipate patient will need medical transport at discharge?: No       Additional Information:Reviewed chart and visited with patient and his wife Rose. Patient has O2 at home with [employer]. Last Temp 100.3 at 1930 last night. Last Bowel Movement 11/21/15. K 3.3. Physical therapy ordered but I do see a note yet. Patient has been falling at home. They are agreeable to Home Health and signed a Alternate Level of Care for [facility]. Discharge Charge Planner will continue to assist with any discharge needs. Marylou Flesher RN Case Manager 11/24/2015 11:25 AM   Face to face in Epic notes, under incomplete. Ready for MD to sign when ready. Marylou Flesher RN Case Manager 11/24/2015 11:26 AM         Richarda Osmond, RN

## 2015-11-25 LAB — PROTIME-INR
INR: 3.6 — ABNORMAL HIGH (ref 0.9–1.2)
Protime: 43.9 Seconds — ABNORMAL HIGH (ref 9.9–13.4)

## 2015-11-25 MED ORDER — amoxicillin-clavulanate (AUGMENTIN) 875-125 mg per tablet
875-125 | ORAL_TABLET | Freq: Two times a day (BID) | ORAL | 0 refills | 7.00000 days | Status: AC
Start: 2015-11-25 — End: 2015-12-02

## 2015-11-25 MED FILL — BROVANA 15 MCG/2 ML SOLUTION FOR NEBULIZATION: 15 mcg/2 mL | RESPIRATORY_TRACT | Qty: 2

## 2015-11-25 MED FILL — OMEPRAZOLE 20 MG CAPSULE,DELAYED RELEASE: 20 mg | ORAL | Qty: 1

## 2015-11-25 MED FILL — POTASSIUM CHLORIDE ER 10 MEQ TABLET,EXTENDED RELEASE(PART/CRYST): 10 meq | ORAL | Qty: 1

## 2015-11-25 MED FILL — AZITHROMYCIN 500 MG INTRAVENOUS SOLUTION: 500 mg | INTRAVENOUS | Qty: 500

## 2015-11-25 MED FILL — BUDESONIDE 0.5 MG/2 ML SUSPENSION FOR NEBULIZATION: 0.5 | RESPIRATORY_TRACT | Qty: 2

## 2015-11-25 MED FILL — IPRATROPIUM 0.5 MG-ALBUTEROL 3 MG (2.5 MG BASE)/3 ML NEBULIZATION SOLN: 0.5 mg-3 mg(2.5 mg base)/3 mL | RESPIRATORY_TRACT | Qty: 1

## 2015-11-25 MED FILL — CLOTRIMAZOLE 10 MG TROCHE: 10 mg | Qty: 1

## 2015-11-25 MED FILL — CEFTRIAXONE 1 GRAM/50 ML IN DEXTROSE (ISO-OSMOT) INTRAVENOUS PIGGYBACK: 1 gram/50 mL | INTRAVENOUS | Qty: 50

## 2015-11-25 MED FILL — METOPROLOL SUCCINATE ER 25 MG TABLET,EXTENDED RELEASE 24 HR: 25 mg | ORAL | Qty: 1

## 2015-11-25 NOTE — Progress Notes (Signed)
PHARMACIST PROGRESS NOTE     Consult Per Physician Order:  Pre-Discharge Pneumonia Patient Education    Eduardo Khan. is a 75 y.o. male. I spoke with the patient or representative and provided pneumonia education. The patient or representative verbalized understanding of the following:    1. The reason for taking antibiotics & importance of medication adherence.    2. The importance of contacting primary care provider, or ED in severe cases, if the patient has drug allergy, intolerance, side effects &/or persistence or worsening of symptoms: Examples:  Fever, more frequent coughs, inhaler is used more often than directed &/or inhaler does not provide relief.    3. Methods to prevent pneumonia. Examples: Get pneumonia & flu vaccines, wash hands.    4. The patient has been provided written education materials, such as Pneumonia Antibiotic Fact sheet &/or pneumonia brochure.    5. Barriers expressed by the patient to obtaining medications have been addressed or referred to appropriate resources.    Bettey Costa, [facility], Pharmacist

## 2015-11-25 NOTE — Progress Notes (Signed)
Pt discharged to home with home health. Wife here to pick up pt. Pt stated understanding of all discharge instructions including medications and follow up appointment. Pt left in no acute distress.

## 2015-11-25 NOTE — Discharge Summary (Signed)
Physician Discharge Summary     Patient ID:  Name: Eduardo Khan.  DOB: 1941/01/20 75 y.o.  MRN: [id number]  CSN: [id number]    Admit date: 11/22/2015    Discharge date: 11/25/2015    Admission Diagnosis: Oral thrush [B37.0]  Pneumonia [J18.9]  Community acquired bacterial pneumonia [J15.9]    Discharge Diagnoses:    Active Hospital Problems    Diagnosis SNOMED CT(R) Date Noted   . Community acquired bacterial pneumonia COMMUNITY ACQUIRED PNEUMONIA 07/06/2015     Priority: High     Class: Acute   . Oral thrush CANDIDIASIS OF MOUTH 11/23/2015   . COPD (chronic obstructive pulmonary disease) (CMS/HCC) CHRONIC OBSTRUCTIVE LUNG DISEASE 07/06/2015     Class: Chronic       Significant Diagnostic Studies: chest film  Results for orders placed or performed during the hospital encounter of 11/22/15 (from the past 24 hour(s))   Protime Panel -Daily    Collection Time: 11/25/15  5:32 AM   Result Value Ref Range    Protime 43.9 (H) 9.9 - 13.4 Seconds    INR 3.6 (H) 0.9 - 1.2     Pending Labs     Order Current Status    Blood Culture -x 2 Preliminary result    Blood Culture -x 2 Preliminary result        Procedures/Treatments: IV rocephin and zithromax.    Consults:   None    Physical Exam:   Clear lungs x for some crackles at base, heart mildly irregular    Hospital Course: He was admitted after recurring fevers following urgent care evaluation, and he had some assoc changes in mental status. He had been started on AUGMENTIN by urgent care, was found to have likely pneumonia on admit, and was placed on rocephin and zithromax.  He felt better over the next couple days. He had some thrush which was addressed. He had PT eval, and they felt he'd benefit from [facility] with PT. He had no significant recurring fevers, was feeling better and was eager to go home. He declined option for SNF placement.   I will have him take the augmentin he already has from urgent care.  He'll also skip 2 days of warfarin.     Discharge Medications:   Eduardo Khan, Emond.   Discharge Medications KGM:[id number]    Printed on:11/25/15 1753   Medication Information            amoxicillin-clavulanate (AUGMENTIN) 875-125 mg per tablet  Take 1 tablet by mouth 2 times daily for 7 days.        fluticasone (FLONASE) 50 mcg/actuation nasal spray  1 spray by Nasal route Daily.        fluticasone-salmeterol (ADVAIR) 250-50 mcg/dose diskus inhaler  Inhale 1 puff into the lungs Twice Daily.        ipratropium-albuterol (DUO-NEB) 0.5 mg-3 mg(2.5 mg base)/3 mL nebulizer solution  Take 3 mLs by nebulization every 6 (six) hours as needed for Wheezing.        KLOR-CON M10 10 mEq tablet  Take 10 mEq by mouth daily.         nadolol (CORGARD) 40 MG tablet  Take 40 mg by mouth daily.        OMEPRAZOLE ORAL  Take 20 mg by mouth daily.         tiotropium (SPIRIVA) 18 mcg inhalation  Inhale 18 mcg into the lungs daily.        warfarin (COUMADIN) 2.5 MG tablet  Take 2.5 mg by mouth daily. On Sun/Tue/Thur/Sat        warfarin (COUMADIN) 5 MG tablet  Take 5 mg by mouth daily. On Mon/Wed/Fri            Patient Instructions/Follow-up Appointments:     Activity: activity as tolerated  Diet:  heart healthy  Discharge Disposition: To home on independent care and Home health.  Follow-up with Dr Mayford Knife in 3d..     LOS: 2 days       Signed:  Levander Campion  11/25/2015  5:53 PM

## 2015-11-25 NOTE — Discharge Instructions (Signed)
Community-Acquired Pneumonia, Adult  Pneumonia is an infection of the lungs. There are different types of pneumonia. One type can develop while a person is in a hospital. A different type, called community-acquired pneumonia, develops in people who are not, or have not recently been, in the hospital or other health care facility.   CAUSES  Pneumonia may be caused by bacteria, viruses, or funguses. Community-acquired pneumonia is often caused by Streptococcus pneumonia bacteria. These bacteria are often passed from one person to another by breathing in droplets from the cough or sneeze of an infected person.  RISK FACTORS  The condition is more likely to develop in:  ? People who have?chronic diseases, such as chronic obstructive pulmonary disease (COPD), asthma, congestive heart failure, cystic fibrosis, diabetes, or kidney disease.  ? People who have?early-stage or late-stage HIV.  ? People who have?sickle cell disease.  ? People who have?had their spleen removed (splenectomy).  ? People who have?poor dental hygiene.  ? People who have?medical conditions that increase the risk of breathing in (aspirating) secretions their own mouth and nose. ?  ? People who have?a weakened immune system (immunocompromised).  ? People who smoke.  ? People who?travel to areas where pneumonia-causing germs commonly exist.  ? People who?are around animal habitats or animals that have pneumonia-causing germs, including birds, bats, rabbits, cats, and farm animals.  SYMPTOMS  Symptoms of this condition include:  ? A?dry cough.  ? A wet (productive) cough.  ? Fever.  ? Sweating.  ? Chest pain, especially when breathing deeply or coughing.  ? Rapid breathing or difficulty breathing.  ? Shortness of breath.  ? Shaking chills.  ? Fatigue.  ? Muscle aches.  DIAGNOSIS  Your health care provider will take a medical history and perform a physical exam. You may also have other tests, including:  ? Imaging studies of your chest, including  X-rays.  ? Tests to check your blood oxygen level and other blood gases.  ? Other tests on blood, mucus (sputum), fluid around your lungs (pleural fluid), and urine.  If your pneumonia is severe, other tests may be done to identify the specific cause of your illness.  TREATMENT  The type of treatment that you receive depends on many factors, such as the cause of your pneumonia, the medicines you take, and other medical conditions that you have. For most adults, treatment and recovery from pneumonia may occur at home. In some cases, treatment must happen in a hospital. Treatment may include:  ? Antibiotic medicines, if the pneumonia was caused by bacteria.  ? Antiviral medicines, if the pneumonia was caused by a virus.  ? Medicines that are given by mouth or through an IV tube.  ? Oxygen.  ? Respiratory therapy.  Although rare, treating severe pneumonia may include:  ? Mechanical ventilation. This is done if you are not breathing well on your own and you cannot maintain a safe blood oxygen level.  ? Thoracentesis. This procedure?removes fluid around one lung or both lungs to help you breathe better.  HOME CARE INSTRUCTIONS  ? Take over-the-counter and prescription medicines only as told by your health care provider.    Only take?cough medicine if you are losing sleep. Understand that cough medicine can prevent your body's natural ability to remove mucus from your lungs.    If you were prescribed an antibiotic medicine, take it as told by your health care provider. Do not stop taking the antibiotic even if you start to feel better.  ? Sleep  in a semi-upright position at night. Try sleeping in a reclining chair, or place a few pillows under your head.  ? Do not use tobacco products, including cigarettes, chewing tobacco, and e-cigarettes. If you need help quitting, ask your health care provider.  ? Drink enough water to keep your urine clear or pale yellow. This will help to thin out mucus secretions in your  lungs.  PREVENTION  There are ways that you can decrease your risk of developing community-acquired pneumonia. Consider getting a pneumococcal vaccine if:  ? You are older than 77 years of age.  ? You are older than 77 years of age and are undergoing cancer treatment, have chronic lung disease, or have other medical conditions that affect your immune system. Ask your health care provider if this applies to you.  There are different types and schedules of pneumococcal vaccines. Ask your health care provider which vaccination option is best for you.  You may also prevent community-acquired pneumonia if you take these actions:  ? Get an influenza vaccine every year. Ask your health care provider which type of influenza vaccine is best for you.  ? Go to the dentist on a regular basis.  ? Wash your hands often. Use hand sanitizer if soap and water are not available.  SEEK MEDICAL CARE IF:  ? You have a fever.  ? You are losing sleep because you cannot control your cough with cough medicine.  SEEK IMMEDIATE MEDICAL CARE IF:  ? You have worsening shortness of breath.  ? You have increased chest pain.  ? Your sickness becomes worse, especially if you are an older adult or have a weakened immune system.  ? You cough up blood.     This information is not intended to replace advice given to you by your health care provider. Make sure you discuss any questions you have with your health care provider.     Document Released: 04/05/2005 Document Revised: 12/25/2014 Document Reviewed: 07/31/2014  Elsevier Interactive Patient Education ?2016 Elsevier Inc.  Amoxicillin; Clavulanic Acid tablets  What is this medicine?  AMOXICILLIN; CLAVULANIC ACID (a mox i SIL in; KLAV yoo lan ic AS id) is a penicillin antibiotic. It is used to treat certain kinds of bacterial infections. It will not work for colds, flu, or other viral infections.  This medicine may be used for other purposes; ask your health care provider or pharmacist if you have  questions.  What should I tell my health care provider before I take this medicine?  They need to know if you have any of these conditions:  -bowel disease, like colitis  -kidney disease  -liver disease  -mononucleosis  -an unusual or allergic reaction to amoxicillin, penicillin, cephalosporin, other antibiotics, clavulanic acid, other medicines, foods, dyes, or preservatives  -pregnant or trying to get pregnant  -breast-feeding  How should I use this medicine?  Take this medicine by mouth with a full glass of water. Follow the directions on the prescription label. Take at the start of a meal. Do not crush or chew. If the tablet has a score line, you may cut it in half at the score line for easier swallowing. Take your medicine at regular intervals. Do not take your medicine more often than directed. Take all of your medicine as directed even if you think you are better. Do not skip doses or stop your medicine early.  Talk to your pediatrician regarding the use of this medicine in children. Special care may be needed.  Overdosage: If you think you have taken too much of this medicine contact a poison control center or emergency room at once.  NOTE: This medicine is only for you. Do not share this medicine with others.  What if I miss a dose?  If you miss a dose, take it as soon as you can. If it is almost time for your next dose, take only that dose. Do not take double or extra doses.  What may interact with this medicine?  -allopurinol  -anticoagulants  -birth control pills  -methotrexate  -probenecid  This list may not describe all possible interactions. Give your health care provider a list of all the medicines, herbs, non-prescription drugs, or dietary supplements you use. Also tell them if you smoke, drink alcohol, or use illegal drugs. Some items may interact with your medicine.  What should I watch for while using this medicine?  Tell your doctor or health care professional if your symptoms do not  improve.  Do not treat diarrhea with over the counter products. Contact your doctor if you have diarrhea that lasts more than 2 days or if it is severe and watery.  If you have diabetes, you may get a false-positive result for sugar in your urine. Check with your doctor or health care professional.  Birth control pills may not work properly while you are taking this medicine. Talk to your doctor about using an extra method of birth control.  What side effects may I notice from receiving this medicine?  Side effects that you should report to your doctor or health care professional as soon as possible:  -allergic reactions like skin rash, itching or hives, swelling of the face, lips, or tongue  -breathing problems  -dark urine  -fever or chills, sore throat  -redness, blistering, peeling or loosening of the skin, including inside the mouth  -seizures  -trouble passing urine or change in the amount of urine  -unusual bleeding, bruising  -unusually weak or tired  -white patches or sores in the mouth or throat  Side effects that usually do not require medical attention (report to your doctor or health care professional if they continue or are bothersome):  -diarrhea  -dizziness  -headache  -nausea, vomiting  -stomach upset  -vaginal or anal irritation  This list may not describe all possible side effects. Call your doctor for medical advice about side effects. You may report side effects to FDA at 1-800-FDA-1088.  Where should I keep my medicine?  Keep out of the reach of children.  Store at room temperature below 25 degrees C (77 degrees F). Keep container tightly closed. Throw away any unused medicine after the expiration date.  NOTE: This sheet is a summary. It may not cover all possible information. If you have questions about this medicine, talk to your doctor, pharmacist, or health care provider.       Thrush, Adult   Ginette Pitman is an infection that can happen on the mouth, throat, tongue, or other areas. It causes  white patches to form on the mouth and tongue.  HOME CARE  ? Only take medicine as told by your doctor. You may be given medicine to swallow or to apply right on the area.  ? Eat plain yogurt that contains live cultures (check the label).  ? Rinse your mouth many times a day with a warm saltwater rinse. To make the rinse, mix 1 teaspoon (6 g) of salt in 8 ounces (0.2 L) of warm water.  To  reduce pain:  ? Drink cold liquids such as water or iced tea.  ? Eat frozen ice pops or frozen juices.  ? Eat foods that are easy to swallow, such as gelatin or ice cream.  ? Drink from a straw if the patches are painful.  If you are breastfeeding:  ? Clean your nipples with an antifungal medicine.  ? Dry your nipples after breastfeeding.  ? Use an ointment called lanolin to help relieve nipple soreness.  If you wear dentures:  ? Take out your dentures before going to bed.  ? Brush them thoroughly.  ? Soak them in a denture cleaner.  GET HELP IF:   ? Your problems are getting worse.  ? Your problems are not improving within 7 days of starting treatment.  ? Your infection is spreading. This may show as white patches on the skin outside of your mouth.  ? You are nursing and have redness and pain in the nipples.  MAKE SURE YOU:  ? Understand these instructions.  ? Will watch your condition.  ? Will get help right away if you are not doing well or get worse.     This information is not intended to replace advice given to you by your health care provider. Make sure you discuss any questions you have with your health care provider.     Document Released: 06/30/2009 Document Revised: 01/24/2013 Document Reviewed: 11/06/2012  Elsevier Interactive Patient Education ?2016 Elsevier Inc.  ? 2016, Elsevier/Gold Standard. (2007-06-29 12:04:30)

## 2015-11-25 NOTE — Discharge Planning (AHS/AVS) (Signed)
Transitional Care Plan     Name:Eduardo Khan. MRN#: [id number]   Date of Birth:Sep 22, 1940 [id number] Mayford Knife, MD   Admit Date: 11/22/2015 Discharge Date: 11/25/2015   Admitting Physician: Dory Peru, MD Readmission Risk: Med   Principal Problem this admit:  Community acquired bacterial pneumonia Risk Factors:Potential Risk Factors: Change in care needs, Readmission/diagnosis CHF/COPD/AMI/Pneum    Goals of Care Conversation:  Goals of Care Conversation  Addressed at this Admission: Yes    Primary language if other than English:none    Emergency Contacts:  Extended Emergency Contact Information  Primary Emergency Contact: Eulamae Greenstein,Rose  Address: [address]           [place] [address] [place]  Home Phone: [phone]  Relation: Spouse  Secondary Emergency Contact: Ron [place]  Home Phone: [phone]  Relation: Daughter Follow ups:   Levander Campion, MD  [address] [place] [address]  [phone]    On 12/02/2015  Appt time 11:30       INTERVENTIONS   Discharge Disposition: Home Health Care Service     Home Health Agency: ~[facility].                            Home Oxygen Provider:                LPM:  [employer]IAL [employer]      POLST:     ADDITIONAL INFORMATION    Chart reviewed. Alternate Level of Care signed for [facility] and I faxed the discharge orders and face to face to [place]. Physical therapy and Occupational Therapy recommend Home Health. No other discharge needs noted at this time. Marylou Flesher RN Case Manager 11/25/2015 4:09 PM         A Copy of this note was routed to:                                             Resource Management: Richarda Osmond, RN

## 2015-11-26 NOTE — Telephone Encounter (Signed)
PATIENT INFORMATION   Hospital: [facility]  Disposition: Home or Self Care    Contact made on 11/26/2015 by Xylah Early.    PATIENT ASSESSMENT   How is the patient feeling since being discharged from the hospital?: Still weak. Using cane for ambulation. Currently headed out to pay bills; wife driving.  Did not start antibiotic at discharge due to papular rash on his back. Has productive cough, white secretions, same as his usual COPD cough.     Were discharge instructions gone over with you prior to discharge?: Yes     Does the patient have the support needed at home?: Yes, patient has support at home.     Was the patient seen for one of the following conditions?: Pneumonia    APP POST VISIT ADDL QUESTIONS 11/26/2015   Was the patient able to fill antibiotics if ordered? Yes   Comment Ordered in urgent care prior to admit.    Is the patient taking antibiotics correctly? No   Comment Is holding due to development of rash on back. Has PCP appt today.    Has the patient's breathing returned to baseline? No   Comment Still shortness of breath with exertion worse than his usual from COPD. Uses albuterol inhaler twice daily on schedule.         MEDICATIONS   Does patient understand what medication they are supposed to be taking?: Resumed routine medications.   Has patient filled all medications prescribed at discharge?: Yes, all medication prescribed at [facility] were filled.  Comments: Antibiotic RX was started in urgent care prior to hospital stay.   Is the patient on Coumadin?: Yes  Comments: Holding for 2 days. INR 8/15  Any questions about medications and/or side effects?: Patient denies medication questions and/or side effects.       FOLLOW UP   Does patient have follow up appointment with PCP?: Yes  Comments: Dr Mayford Knife 8/9  Is the follow up with PCP within 3-5 days of discharge?: Within 5 days     Does patient have follow up with a specialist?: No     Does patient need help with  tranpsportation to appointments?: Denies transportation needs at this time.     If home care services were ordered at the time of discharge, have they made contact with the patient?: No  Comments: Patient may not accept service of [facility] when they call.     COMPLETION OF CALL   Are there any further questions or concerns the patient has prior to completing the call?: Denies any questions or concerns at this time.

## 2015-12-03 NOTE — Telephone Encounter (Signed)
Attempted to call patient about recent hospitalization. Patient did not answer, will attempt again. LVM with my contact number.

## 2015-12-04 NOTE — Telephone Encounter (Signed)
PATIENT INFORMATION   Hospital: [facility]  Disposition: Home or Self Care    Contact made on 12/04/2015 by Embrie Mikkelsen L. RUPPEL.    PATIENT ASSESSMENT   How is the patient feeling since being discharged from the hospital?: Doing well. States he continues to be quite tired and weak.               Was the patient seen for one of the following conditions?: Pneumonia    APP POST VISIT ADDL QUESTIONS 12/04/2015   Was the patient able to fill antibiotics if ordered? Course Completed   Comment -   Is the patient taking antibiotics correctly? Course Completed   Comment PCP had him stop the antibiotics due to a rash. Did not have him start any new antibiotics    Has the patient's breathing returned to baseline? No   Comment It is better than it was, but not back to baseline        MEDICATIONS                          FOLLOW UP                                   COMPLETION OF CALL   Are there any further questions or concerns the patient has prior to completing the call?: Denies any questions or concerns at this time.
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
## 2015-12-24 NOTE — Telephone Encounter (Signed)
Attempted to call patient about recent hospitalization. Patient was at doctor appt, will attempt again.
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
## 2016-01-29 ENCOUNTER — Other Ambulatory Visit: Admit: 2016-01-29 | Discharge: 2016-01-29 | Payer: MEDICARE | Primary: MD

## 2016-01-29 DIAGNOSIS — I714 Abdominal aortic aneurysm, without rupture: Secondary | ICD-10-CM

## 2016-01-29 LAB — BUN CREATININE
BUN: 16 mg/dL (ref 6–23)
Creatinine: 0.74 mg/dL (ref 0.65–1.30)
GFR Estimate: >60 mL/min/{1.73_m2} (ref >=60)

## 2016-02-10 ENCOUNTER — Inpatient Hospital Stay: Admit: 2016-02-10 | Payer: MEDICARE | Attending: MD | Primary: MD

## 2016-02-10 DIAGNOSIS — I714 Abdominal aortic aneurysm, without rupture: Secondary | ICD-10-CM

## 2016-02-10 MED ORDER — technetium TC 99M sestamibi (CARDIOLITE) injection 26.5 millicurie
Freq: Once | Status: AC
Start: 2016-02-10 — End: 2016-02-10
  Administered 2016-02-10: 18:00:00 via INTRAVENOUS

## 2016-02-10 MED ORDER — regadenoson (LEXISCAN) syringe 0.4 mg
0.4 | Freq: Once | INTRAVENOUS | Status: AC
Start: 2016-02-10 — End: 2016-02-11
  Administered 2016-02-11: 16:00:00 0.4 mg via INTRAVENOUS

## 2016-02-10 MED FILL — LEXISCAN 0.4 MG/5 ML INTRAVENOUS SYRINGE: 0.4 | INTRAVENOUS | Qty: 5

## 2016-02-11 ENCOUNTER — Inpatient Hospital Stay: Admit: 2016-02-11 | Payer: MEDICARE | Attending: MD | Primary: MD

## 2016-02-11 MED ORDER — technetium TC 99M sestamibi (CARDIOLITE) injection 27.2 millicurie
Freq: Once | Status: AC
Start: 2016-02-11 — End: 2016-02-11
  Administered 2016-02-11: 16:00:00 via INTRAVENOUS

## 2016-03-08 ENCOUNTER — Ambulatory Visit: Admit: 2016-03-08 | Discharge: 2016-03-08 | Payer: MEDICARE | Primary: MD

## 2016-03-08 ENCOUNTER — Encounter: Admit: 2016-03-08 | Discharge: 2016-03-08 | Payer: MEDICARE | Primary: MD

## 2016-03-08 DIAGNOSIS — Z01818 Encounter for other preprocedural examination: Secondary | ICD-10-CM

## 2016-03-08 LAB — CBC WITH AUTO DIFFERENTIAL
Eosinophils %: 1 % (ref 0–7)
HCT: 54.1 % — ABNORMAL HIGH (ref 42.0–54.0)
Hemoglobin: 17.9 g/dL (ref 12.0–18.0)
Lymphocytes %: 23 % — ABNORMAL LOW (ref 25–45)
MCH: 33.4 pg (ref 27.0–34.0)
MCHC: 33.2 g/dL (ref 32.0–36.0)
MCV: 100.8 fL — ABNORMAL HIGH (ref 81.0–99.0)
MPV: 9 fL (ref 7.4–10.4)
Monocytes %: 9 % (ref 0–12)
Neutrophils %: 67 % (ref 35–70)
Platelet Count: 112 10*3/ÂµL — ABNORMAL LOW (ref 150–400)
RBC: 5.37 10*6/ÂµL (ref 4.70–6.10)
RDW: 13.8 % (ref 11.5–14.5)
WBC: 6.8 10*3/ÂµL (ref 4.8–10.8)

## 2016-03-08 LAB — ABO/RH (HCLL): Rh (D): POSITIVE

## 2016-03-08 LAB — MANUAL DIFFERENTIAL
Adjusted WBC: 6.8 10*3/ÂµL
Platelet Estimate: DECREASED

## 2016-03-08 LAB — PROTIME-INR
INR: 2.4 — ABNORMAL HIGH (ref 0.9–1.2)
Protime: 28.3 Seconds — ABNORMAL HIGH (ref 9.9–13.4)

## 2016-03-08 LAB — ANTIBODY SCREEN (HCLL): Antibody Screen: NEGATIVE

## 2016-03-08 LAB — APTT: APTT: 44 Seconds — ABNORMAL HIGH (ref 23–36)

## 2016-03-08 NOTE — Progress Notes (Signed)
ECG completed

## 2016-03-08 NOTE — Discharge Instructions (Signed)
SURGERY INSTRUCTIONS:      REPORT TO REGISTRATION ON THE FIRST FLOOR of the MAIN Hospital Building on Monday November 27 th at 5:45 am       If for any reason your surgery start time is changed, you will receive a call from Pre-op Clinic between 4 and 5 the evening before surgery.    No food, gum or hard candy after midnight; WATER ONLY until 4 am then NOTHING by mouth; BUT DRINK WELL THE DAY BEFORE SURGERY    Shower as directed with   Hibiclens    Antibacterial soap    Smoking / Alcohol discouraged 24 hours before surgery    For overnight:  Bring robe, slippers, and toiletries    Clothes:  Easy on, easy off, eye glasses / case, contact lenses / case    Dentures, crutches, wheelchair, cane, walker, CPAP, children's blanket / toys    Leave at home:  Valuables, jewelry, medications    Bring a list of your medications and supplements showing the dosages and how you take them       We recommend that a family member or friend be available to assist you for the first 24 hours following your procedure. Individuals that have received either sedation or anesthesia will NOT be allowed to drive home.  Please arrange for a ride home.  Patients will have their procedure cancelled if these arrangements are not made ahead of time.    . If you need to cancel your surgery or develop any illness prior to surgery (fever, cough, sore throat, cold, flu, infection) please call your surgeon    . If you become ill after business hours the night before your procedure, please call the [facility] Supervisor at             [phone]    . If you have questions about your procedure please call Pre-op Clinic at [phone] between 8 am and 4:30 pm Mon-Friday      Instructions Following Anesthesia/ Sedation  A nurse specialized in giving anesthesia (anesthetist) or a doctor specialized in giving anesthesia (anesthesiologist) will give you medicine during your procedure. For as long as 24 hours following your procedure, you may  feel:  . Dizzy.  Marcy Salvo.  . Drowsy.  . Nausea with vomiting.  AFTER THE PROCEDURE  After surgery, you will be taken to the recovery area where a nurse will monitor your progress. You will be allowed to go home when you are awake, stable, taking fluids well, and without complications.  For the first 24 hours following your procedure:  . Have a responsible person with you.  Marland Kitchen Resume diet as directed by your physician.  . Do not plan to do anything that requires clear thinking, such as driving a car, cooking or making decisions.  .   If you have questions or problems that seem related to the anesthetic, call the hospital at [phone] and ask for the anesthetist on call.    SEEK IMMEDIATE MEDICAL CARE IF:   . You develop a rash.  . You have difficulty breathing.  . You have chest pain.  . You develop any allergic problems.  . You are unable to keep any food or fluids down.     Cristie Hem Clent Demark.   Prior to Surgery Medication Instructions [id number]    Printed on:03/08/16 1418   Medication Information Take Morning of Surgery Special Instructions  acetaminophen (TYLENOL) 500 MG tablet  (ACETAMINOPHEN)  Take 1,000 mg by mouth every 6 hours as needed for Pain.        diphenhydramine-acetaminophen (TYLENOL PM) 25-500 mg Tab  (ACETAMINOPHEN/DIPHENHYDRAMINE)  Take 2 tablets by mouth every night at bedtime.        fluticasone (FLONASE) 50 mcg/actuation nasal spray  (FLUTICASONE PROPIONATE)  1 spray by Nasal route Daily.   Am of surgery     fluticasone-salmeterol (ADVAIR) 250-50 mcg/dose diskus inhaler  (FLUTICASONE/SALMETEROL)  Inhale 1 puff into the lungs Twice Daily.   Am of surgery     ipratropium-albuterol (DUO-NEB) 0.5 mg-3 mg(2.5 mg base)/3 mL nebulizer solution  (IPRATROPIUM/ALBUTEROL SULFATE)  Take 3 mLs by nebulization every 6 (six) hours as needed for Wheezing.   As needed am of surgery     KLOR-CON M10 10 mEq tablet  (POTASSIUM CHLORIDE)  Take 10 mEq by mouth daily.         MULTIVIT WITH  IRON,MINERALS (MULTIVITAMIN-IRON-MINERALS ORAL)  (MULTIVIT WITH IRON,MINERALS)  Take 1 tablet by mouth every morning.        nadolol (CORGARD) 40 MG tablet  (NADOLOL)  Take 40 mg by mouth daily.   Am of surgery by 4 am      OMEPRAZOLE ORAL  (OMEPRAZOLE)  Take 20 mg by mouth daily.         OXYGEN-AIR DELIVERY SYSTEMS MISC  (OXYGEN-AIR DELIVERY SYSTEMS)  2 L by Miscellaneous route every night at bedtime.        tiotropium (SPIRIVA) 18 mcg inhalation  (TIOTROPIUM BROMIDE)  Inhale 18 mcg into the lungs daily.   Am of surgery     warfarin (COUMADIN) 2.5 MG tablet  (WARFARIN SODIUM)  Take 2.5 mg by mouth daily. On Sun/Tue/Thur/Sat    Last taken 11/22    warfarin (COUMADIN) 5 MG tablet  (WARFARIN SODIUM)  Take 5 mg by mouth daily. On Mon/Wed/Fri    Last taken 11/22

## 2016-03-08 NOTE — Procedures (Signed)
H&P 10/9 w/ parq progress not 10/31 w/ parq both in notes  Consent to be signed DOS in media  Orders in media

## 2016-03-15 ENCOUNTER — Inpatient Hospital Stay
Admit: 2016-03-15 | Discharge: 2016-03-20 | Disposition: A | Discharge status: Home Health | Payer: MEDICARE | Source: Ambulatory Visit | Attending: MD | Admitting: MD

## 2016-03-15 ENCOUNTER — Inpatient Hospital Stay: Admit: 2016-03-15 | Payer: MEDICARE | Primary: MD

## 2016-03-15 DIAGNOSIS — I714 Abdominal aortic aneurysm, without rupture: Secondary | ICD-10-CM

## 2016-03-15 LAB — CBC WITHOUT DIFFERENTIAL
HCT: 42.2 % (ref 42.0–54.0)
Hemoglobin: 14.4 g/dL (ref 12.0–18.0)
MCH: 33.6 pg (ref 27.0–34.0)
MCHC: 34 g/dL (ref 32.0–36.0)
MCV: 99 fL (ref 81.0–99.0)
MPV: 8.7 fL (ref 7.4–10.4)
Platelet Count: 102 10*3/ÂµL — ABNORMAL LOW (ref 150–400)
RBC: 4.27 10*6/ÂµL — ABNORMAL LOW (ref 4.70–6.10)
RDW: 13.3 % (ref 11.5–14.5)
WBC: 10.6 10*3/ÂµL (ref 4.8–10.8)

## 2016-03-15 LAB — TISSUE EXAM

## 2016-03-15 LAB — PROTIME-INR
INR: 1.2 (ref 0.9–1.2)
Protime: 14.1 Seconds — ABNORMAL HIGH (ref 9.9–13.4)

## 2016-03-15 MED ORDER — heparin 10,000 unit/mL injection
10000 | INTRAMUSCULAR | Status: AC
Start: 2016-03-15 — End: ?

## 2016-03-15 MED ORDER — heparin injection
10000 | INTRAMUSCULAR | Status: DC | PRN
Start: 2016-03-15 — End: 2016-03-15
  Administered 2016-03-15 (×2)

## 2016-03-15 MED ORDER — ePHEDrine sulfate in NS (PF) 50 mg/5 mL (10 mg/mL) syringe
50 | INTRAVENOUS | Status: AC
Start: 2016-03-15 — End: ?

## 2016-03-15 MED ORDER — heparin (porcine) injection
1000 | INTRAMUSCULAR | Status: DC | PRN
Start: 2016-03-15 — End: 2016-03-15
  Administered 2016-03-15 (×4): via INTRAVENOUS

## 2016-03-15 MED ORDER — midazolam (PF) (VERSED) injection
1 | INTRAMUSCULAR | Status: DC | PRN
Start: 2016-03-15 — End: 2016-03-15
  Administered 2016-03-15: 15:00:00 1 mg/mL via INTRAVENOUS

## 2016-03-15 MED ORDER — sodium chloride (NS) 0.9% irrigation
0.9 | Status: DC | PRN
Start: 2016-03-15 — End: 2016-03-15
  Administered 2016-03-15 (×3): 0.9 % irrigation

## 2016-03-15 MED ORDER — sterile water (bottle) irrigation
Status: DC | PRN
Start: 2016-03-15 — End: 2016-03-15
  Administered 2016-03-15: 17:00:00

## 2016-03-15 MED ORDER — propofol (DIPRIVAN) injection
10 | INTRAVENOUS | Status: DC | PRN
Start: 2016-03-15 — End: 2016-03-15
  Administered 2016-03-15 (×2): 10 mg/mL via INTRAVENOUS

## 2016-03-15 MED ORDER — fentanyl with ropivacaine (PF) 2-0.125 mcg/mL-% epidural
2-0.125 | EPIDURAL | Status: DC
Start: 2016-03-15 — End: 2016-03-17
  Administered 2016-03-15: via EPIDURAL
  Administered 2016-03-16 – 2016-03-17 (×5): 2-0.125- mL/h via EPIDURAL

## 2016-03-15 MED ORDER — ceFAZolin (ANCEF) 1 gram injection
1 | INTRAMUSCULAR | Status: AC
Start: 2016-03-15 — End: ?

## 2016-03-15 MED ORDER — diphenhydrAMINE (BENADRYL) injection 12.5-25 mg
50 | Freq: Four times a day (QID) | INTRAMUSCULAR | Status: DC | PRN
Start: 2016-03-15 — End: 2016-03-17

## 2016-03-15 MED ORDER — ceFAZolin (ANCEF) 2 gram/50 mL in D5W IVPB Premix
2 | INTRAVENOUS | Status: AC
Start: 2016-03-15 — End: ?

## 2016-03-15 MED ORDER — midazolam (PF) (VERSED) 1 mg/mL injection
1 | INTRAMUSCULAR | Status: AC
Start: 2016-03-15 — End: ?

## 2016-03-15 MED ORDER — ondansetron (ZOFRAN) injection
4 | INTRAMUSCULAR | Status: DC | PRN
Start: 2016-03-15 — End: 2016-03-15
  Administered 2016-03-15: 22:00:00 4 mg/2 mL via INTRAVENOUS

## 2016-03-15 MED ORDER — dexmedetomidine (PRECEDEX) 100 mcg/mL injection
100 | INTRAVENOUS | Status: AC
Start: 2016-03-15 — End: ?

## 2016-03-15 MED ORDER — ropivacaine (PF) (NAROPIN) 2 mg/mL (0.2 %) infusion
2 | INTRAMUSCULAR | Status: DC | PRN
Start: 2016-03-15 — End: 2016-03-15
  Administered 2016-03-15 (×3): 2 mg/mL (0. %) via EPIDURAL

## 2016-03-15 MED ORDER — neostigmine (PROSTIGMINE) injection
3 | INTRAVENOUS | Status: DC | PRN
Start: 2016-03-15 — End: 2016-03-15
  Administered 2016-03-15: 22:00:00 3 mg/ mL (1 mg/mL) via INTRAVENOUS

## 2016-03-15 MED ORDER — HYDROmorphone (DILAUDID) 1 mg/mL (1 mL) syringe 0.4 mg
1 | INTRAVENOUS | Status: DC | PRN
Start: 2016-03-15 — End: 2016-03-15

## 2016-03-15 MED ORDER — thrombin (RECOTHROM) 5,000 unit topical solution
5000 | TOPICAL | Status: AC
Start: 2016-03-15 — End: ?

## 2016-03-15 MED ORDER — fentaNYL (PF) (SUBLIMAZE) 50 mcg/mL injection
50 | INTRAMUSCULAR | Status: AC
Start: 2016-03-15 — End: ?

## 2016-03-15 MED ORDER — HYDROmorphone (DILAUDID) 2 mg/mL syringe
2 | INTRAMUSCULAR | Status: AC
Start: 2016-03-15 — End: ?

## 2016-03-15 MED ORDER — dexamethasone (DECADRON) injection
4 | INTRAMUSCULAR | Status: DC | PRN
Start: 2016-03-15 — End: 2016-03-15
  Administered 2016-03-15: 20:00:00 4 mg/mL via INTRAVENOUS

## 2016-03-15 MED ORDER — naloxone (NARCAN) injection 0.08 mg
0.4 | INTRAMUSCULAR | Status: DC | PRN
Start: 2016-03-15 — End: 2016-03-17

## 2016-03-15 MED ORDER — rocuronium (ZEMURON) injection
10 | INTRAVENOUS | Status: DC | PRN
Start: 2016-03-15 — End: 2016-03-15
  Administered 2016-03-15 (×4): 10 mg/mL via INTRAVENOUS

## 2016-03-15 MED ORDER — fentaNYL (PF) (SUBLIMAZE) injection 25-50 mcg
50 | INTRAMUSCULAR | Status: DC | PRN
Start: 2016-03-15 — End: 2016-03-15

## 2016-03-15 MED ORDER — bacitracin-polymyxin b (POLYSPORIN) 500-10,000 unit/gram ointment
500-10000 | TOPICAL | Status: AC
Start: 2016-03-15 — End: ?

## 2016-03-15 MED ORDER — prochlorperazine (COMPAZINE) injection 5 mg
10 | Freq: Four times a day (QID) | INTRAMUSCULAR | Status: DC | PRN
Start: 2016-03-15 — End: 2016-03-15

## 2016-03-15 MED ORDER — fentaNYL (PF) (SUBLIMAZE) injection
50 | INTRAMUSCULAR | Status: DC | PRN
Start: 2016-03-15 — End: 2016-03-15
  Administered 2016-03-15 (×2): 50 mcg/mL via INTRAVENOUS

## 2016-03-15 MED ORDER — glycopyrrolate (ROBINUL) injection
0.2 | INTRAMUSCULAR | Status: DC | PRN
Start: 2016-03-15 — End: 2016-03-15
  Administered 2016-03-15: 22:00:00 0.2 mg/mL via INTRAVENOUS

## 2016-03-15 MED ORDER — ceFAZolin (ANCEF) 2 g in D5W IVPB Premix
2 | Freq: Once | INTRAVENOUS | Status: AC
Start: 2016-03-15 — End: 2016-03-15
  Administered 2016-03-15 (×2): 2 g via INTRAVENOUS

## 2016-03-15 MED ORDER — sodium chloride 0.9 % (NS) infusion
INTRAVENOUS | Status: AC
Start: 2016-03-15 — End: ?

## 2016-03-15 MED ORDER — hetastarch 6% in 0.9% NaCl (HESPAN) infusion
6 | INTRAVENOUS | Status: DC | PRN
Start: 2016-03-15 — End: 2016-03-15
  Administered 2016-03-15 (×2): 6 % via INTRAVENOUS

## 2016-03-15 MED ORDER — bupivacaine (PF) (MARCAINE) 0.5 % (5 mg/mL) injection
0.5 | INTRAMUSCULAR | Status: AC
Start: 2016-03-15 — End: ?

## 2016-03-15 MED ORDER — lidocaine (PF) (XYLOCAINE) 20 mg/mL (2 %) injection
20 | INTRAMUSCULAR | Status: DC | PRN
Start: 2016-03-15 — End: 2016-03-15
  Administered 2016-03-15: 16:00:00 20 mg/mL (2 %) via EPIDURAL

## 2016-03-15 MED ORDER — dexmedetomidine (PRECEDEX) injection
100 | INTRAVENOUS | Status: DC | PRN
Start: 2016-03-15 — End: 2016-03-15
  Administered 2016-03-15: 22:00:00 100 mcg/mL via INTRAVENOUS

## 2016-03-15 MED ORDER — ceFAZolin (ANCEF) injection
1 | INTRAMUSCULAR | Status: DC | PRN
Start: 2016-03-15 — End: 2016-03-15
  Administered 2016-03-15: 21:00:00 1 gram via TOPICAL

## 2016-03-15 MED ORDER — HYDROmorphone (DILAUDID) syringe
2 | INTRAMUSCULAR | Status: DC | PRN
Start: 2016-03-15 — End: 2016-03-15
  Administered 2016-03-15 (×2): 2 mg/mL via INTRAVENOUS

## 2016-03-15 MED ORDER — diphenhydrAMINE (BENADRYL) capsule 25 mg
25 | Freq: Four times a day (QID) | ORAL | Status: DC | PRN
Start: 2016-03-15 — End: 2016-03-17

## 2016-03-15 MED ORDER — lidocaine (PF) 1 % (XYLOCAINE) 10 mg/mL (1 %) injection
10 | INTRAMUSCULAR | Status: AC
Start: 2016-03-15 — End: ?

## 2016-03-15 MED ORDER — fentaNYL (PF) (SUBLIMAZE) injection 50-100 mcg
50 | INTRAMUSCULAR | Status: DC | PRN
Start: 2016-03-15 — End: 2016-03-15

## 2016-03-15 MED ORDER — meperidine (PF) (DEMEROL) syringe 12.5 mg
50 | INTRAMUSCULAR | Status: DC | PRN
Start: 2016-03-15 — End: 2016-03-15

## 2016-03-15 MED ORDER — lactated Ringers infusion
INTRAVENOUS | Status: DC
Start: 2016-03-15 — End: 2016-03-15
  Administered 2016-03-15 – 2016-03-16 (×5): via INTRAVENOUS

## 2016-03-15 MED ORDER — hetastarch 6% in 0.9% NaCl (HESPAN) 6 % infusion
6 | INTRAVENOUS | Status: AC
Start: 2016-03-15 — End: ?

## 2016-03-15 MED ORDER — ondansetron (ZOFRAN) injection 4 mg
4 | Freq: Four times a day (QID) | INTRAMUSCULAR | Status: DC | PRN
Start: 2016-03-15 — End: 2016-03-15

## 2016-03-15 MED ORDER — diphenhydrAMINE (BENADRYL) injection 25 mg
50 | INTRAMUSCULAR | Status: DC | PRN
Start: 2016-03-15 — End: 2016-03-15

## 2016-03-15 MED ORDER — phenylephrine (NEO-SYNEPHRINE) injection
10 | INTRAMUSCULAR | Status: DC | PRN
Start: 2016-03-15 — End: 2016-03-15
  Administered 2016-03-15 (×9): 10 mg/mL via INTRAVENOUS

## 2016-03-15 MED ORDER — papaverine 30 mg/mL injection
30 | INTRAMUSCULAR | Status: AC
Start: 2016-03-15 — End: ?

## 2016-03-15 MED ORDER — sodium chloride 0.9 % infusion
INTRAVENOUS | Status: DC | PRN
Start: 2016-03-15 — End: 2016-03-15
  Administered 2016-03-15 (×2): via INTRAVENOUS

## 2016-03-15 MED ORDER — sodium chloride 0.9 % infusion
INTRAVENOUS | Status: DC | PRN
Start: 2016-03-15 — End: 2016-03-15
  Administered 2016-03-15 (×2)

## 2016-03-15 MED FILL — RECOTHROM 5,000 UNIT TOPICAL SOLUTION: 5000 [IU] | TOPICAL | Qty: 1

## 2016-03-15 MED FILL — FENTANYL (PF) 50 MCG/ML INJECTION SOLUTION: 50 ug/mL | INTRAMUSCULAR | Qty: 2

## 2016-03-15 MED FILL — RECOTHROM 5,000 UNIT TOPICAL SOLUTION: 5000 [IU] | TOPICAL | Qty: 3

## 2016-03-15 MED FILL — HESPAN 6 % IN NS INTRAVENOUS SOLUTION: 6 % | INTRAVENOUS | Qty: 500

## 2016-03-15 MED FILL — XYLOCAINE-MPF 10 MG/ML (1 %) INJECTION SOLUTION: 10 mg/mL (1 %) | INTRAMUSCULAR | Qty: 30

## 2016-03-15 MED FILL — FENTANYL-ROPIVACAINE-NACL (PF) 2 MCG/ML-0.125 % EPIDURAL SOLUTION: 2-0.125 mcg/mL-% | EPIDURAL | Qty: 250

## 2016-03-15 MED FILL — DOUBLE ANTIBIOTIC 500 UNIT-10,000 UNIT/GRAM TOPICAL OINTMENT: 500-10000 [IU]/g | TOPICAL | Qty: 28.4

## 2016-03-15 MED FILL — SODIUM CHLORIDE 0.9 % INTRAVENOUS SOLUTION: INTRAVENOUS | Qty: 200

## 2016-03-15 MED FILL — CEFAZOLIN 1 GRAM SOLUTION FOR INJECTION: 1 g | INTRAMUSCULAR | Qty: 2000

## 2016-03-15 MED FILL — DEXMEDETOMIDINE 100 MCG/ML INTRAVENOUS SOLUTION: 100 ug/mL | INTRAVENOUS | Qty: 2

## 2016-03-15 MED FILL — HEPARIN (PORCINE) 10,000 UNIT/ML INJECTION VIAL: 10000 [IU]/mL | INTRAMUSCULAR | Qty: 4

## 2016-03-15 MED FILL — BUPIVACAINE (PF) 0.5 % (5 MG/ML) INJECTION VIAL: 0.5 % (5 mg/mL) | INTRAMUSCULAR | Qty: 30

## 2016-03-15 MED FILL — CEFAZOLIN 2 GRAM/50 ML IN DEXTROSE (ISO-OSMOTIC) INTRAVENOUS PIGGYBACK: 2 gram/50 mL | INTRAVENOUS | Qty: 50

## 2016-03-15 MED FILL — HYDROMORPHONE 2 MG/ML INJECTION SYRINGE: 2 mg/mL | INTRAMUSCULAR | Qty: 2

## 2016-03-15 MED FILL — PAPAVERINE 30 MG/ML INJECTION SOLUTION: 30 mg/mL | INTRAMUSCULAR | Qty: 2

## 2016-03-15 MED FILL — MIDAZOLAM (PF) 1 MG/ML INJECTION SOLUTION: 1 mg/mL | INTRAMUSCULAR | Qty: 2

## 2016-03-15 MED FILL — EPHEDRINE (PF) 50 MG/5 ML (10 MG/ML) IN 0.9% SOD. CHLORIDE IV SYRINGE: 50 mg/5 mL (10 mg/mL) | INTRAVENOUS | Qty: 10

## 2016-03-15 NOTE — Progress Notes (Signed)
RESPIRATORY ASSESSMENT    SUBJECTIVE: pt states breathing feels fine as of now.       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 Use IS as needed.       PLAN: convert advair per protocol, pulmicort and brovana BID, convert spiriva to atrovent BID and continue duoneb Q6 PRN. O2 per protocol and reassess in 72 hours or as needed.       GOAL(S):  Medication:   Maintain a patent airway  Mobilize secretions  Decrease work of breathing  Minimize or eliminate adventitious breath sounds  Maximize airflow    Pulmonary/Bronchial Hygiene:  Maintain a patent airway  Improve mucocilliary clearance  Prevent or correct atelectasis    Supplemental Oxygen:   Maintain SpO2 >90%  Normalize and maintain an adequate SpO2/PaO2 appropriate for patient clinical situation    E.S.  Berton Mount

## 2016-03-15 NOTE — Anesthesia Procedure Notes (Addendum)
Arterial Line:    An arterial line was placed using ultrasound guidance, in the OR for the following indication(s): continuous blood pressure monitoring.    A 20 gauge (size), 1 and 1/4 inch (length), Arrow (type) catheter was placed, Seldinger technique used, into the left radial artery, secured by tape and Tegaderm.    Events:  patient tolerated procedure well with no complications.    Additional notes:  Attempt x 2 by Talbert Forest SRNA unsuccessful, attempt x1 with ultrasound by CRNA successful  Allens test, good collateral circulation  General Information and Staff    SRNA / Other: SHORT, JACLYN  Performed: CRNA and SRNA

## 2016-03-15 NOTE — OR Nursing (Signed)
Family updated on patient status.

## 2016-03-15 NOTE — OR Nursing (Signed)
Family Updated on Patient Status

## 2016-03-15 NOTE — Progress Notes (Signed)
1730 Report received from Jasmine December, [place] in PACU.  Pt arrived via gurney, HR 60's A-fib.  SBP low 100's.  Attached to monitor and initiated CCU [facility].  Pulses checked c doppler.  R PT dopplerable, L DP dopplerable.  Feet cool, cap refill delayed.  3 dressings are intact.  Scant amt drainage to mid abd dsg.  Pt denies nausea, falls asleep easily, says pain is mild.      1900 Report at bedside given to Massie Kluver, RN orders reviewed.

## 2016-03-15 NOTE — H&P (Signed)
General Surgery    CONSULT HISTORY AND PHYSICAL EXAM    Patient:   Eduardo Khan.  MRN:    [id number]  Author:     Tish Men  Attending Physician:  same  Date of Service:  03/15/2016  Consulting Attending/Service: N/A        HISTORY OF PRESENT ILLNESS/ INTERVAL UPDATE:  Eduardo Nhan. is a 75 y.o. male for open repair 5.3cm AAA with 6mm growth over 6 months.  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  Lexiscan study showed low likelihood for flow limiting CAD.  He is recommended for open repair.    PAST MEDICAL HISTORY:   Past Medical History:   Diagnosis Date   . A-fib (CMS/HCC)    . A-fib (CMS/HCC)    . Cancer (CMS/HCC)     skin on hand and face   . Chronic pain     aching low back   . COPD (chronic obstructive pulmonary disease) (CMS/HCC)    . GERD (gastroesophageal reflux disease)     controlled   . Hypertension    . PONV (postoperative nausea and vomiting)     with morphine       PAST SURGICAL HISTORY:   Past Surgical History:   Procedure Laterality Date   . BACK SURGERY      L3/4 herniated disk 2015?   Marland Kitchen EYE SURGERY      both eyes, cataracts, right eye first of March       MEDICATIONS:   No current facility-administered medications on file prior to encounter.      Current Outpatient Prescriptions on File Prior to Encounter   Medication Sig Dispense Refill   . fluticasone (FLONASE) 50 mcg/actuation nasal spray 1 spray by Nasal route Daily.     . fluticasone-salmeterol (ADVAIR) 250-50 mcg/dose diskus inhaler Inhale 1 puff into the lungs Twice Daily.     Marland Kitchen ipratropium-albuterol (DUO-NEB) 0.5 mg-3 mg(2.5 mg base)/3 mL nebulizer solution Take 3 mLs by nebulization every 6 (six) hours as needed for Wheezing.     Marland Kitchen KLOR-CON M10 10 mEq tablet Take 10 mEq by mouth daily.      . nadolol (CORGARD) 40 MG tablet Take 40 mg by mouth daily.     Marland Kitchen OMEPRAZOLE ORAL Take 20 mg by mouth daily.      Marland Kitchen  tiotropium (SPIRIVA) 18 mcg inhalation Inhale 18 mcg into the lungs daily.     Marland Kitchen warfarin (COUMADIN) 2.5 MG tablet Take 2.5 mg by mouth daily. On Sun/Tue/Thur/Sat     . warfarin (COUMADIN) 5 MG tablet Take 5 mg by mouth daily. On Mon/Wed/Fri         ALLERGIES:   Allergies   Allergen Reactions   . Venom-Honey Bee Anaphylaxis     Swelled lips and tongue   . Yellow Jacket Venom Shortness Of Breath and Swelling     Swelling of lips and tongues   . Codeine Nausea And Vomiting   . Morphine Nausea And Vomiting       SOCIAL HISTORY:   Social History     Social History   . Marital status: Married     Spouse name: N/A   . Number of children: N/A   . Years of education: N/A     Occupational History   . Not on file.     Social History Main Topics   .  Smoking status: Current Every Day Smoker     Packs/day: 1.00     Years: 60.00   . Smokeless tobacco: Never Used   . Alcohol use 8.4 oz/week     14 Cans of beer per week      Comment: occasion   . Drug use: Unknown   . Sexual activity: Not on file     Other Topics Concern   . Not on file     Social History Narrative   . No narrative on file       FAMILY HISTORY: History reviewed. No pertinent family history.    REVIEW OF SYSTEMS:   A full 10 point review of systems was completed and is negative, except for the pertinent positives and negatives as noted above in the history of present illness.    ============================================================   PHYSICAL EXAM:   CURRENT VITALS:   Vitals:    03/15/16 0623   BP: (!) 158/102   Pulse: 110   Resp: 20   Temp: 36.3 ?C (97.3 ?F)   SpO2: 98%       General:    in no apparent distress  HEENT:  NC/AT, neck supple, trachea midline, oropharynx clear, sclera anicteric and non-injected, mucous membranes moist  Respiratory:   clear to auscultation bilaterally and prolonged expiration  Cardiovascular:  RRR without murmurs, pulses 2+ & equal bilaterally w/ capillary refill < 2 sec.  Gastrointestinal:  abdomen is soft without significant  tenderness, masses, organomegaly or guarding  Neurologic:  CN 2-12 grossly intact, MAE to commands  Extremities:  WWP, no edema    ============================================================  LABORATORY DATA:  Chemistries  No results for input(s): NA, K, CL, CO2, BUN, CR, MG, AST, ALT, ALKPHOS, BILITOT in the last 72 hours.    Invalid input(s): CA, PO4, ALBU  Lab Results   Component Value Date    LABPROT 7.1 05/16/2013     No results found for: AMYLASE  Lab Results   Component Value Date    LIPASE 24 05/16/2013       CBC  No results for input(s): WHITEBLOODCE, HGB, HCT, LABPLAT in the last 72 hours.  CBG's  No results for input(s): GLU in the last 72 hours.    Coagulation:  Lab Results   Component Value Date    INR 2.4 (H) 03/08/2016    INR 3.6 (H) 11/25/2015    INR 2.5 (H) 11/24/2015    PROTIME 28.3 (H) 03/08/2016    PROTIME 43.9 (H) 11/25/2015    PROTIME 30.3 (H) 11/24/2015       Cardiac Enzymes  Lab Results   Component Value Date    TROPONINI <0.02 05/17/2013       ============================================================  RADIOLOGY AND OTHER DIAGNOSTICS:  As per HPI   ============================================================  ASSESSMENT:  Eduardo Chery. is a 75 y.o. male for open repair 5.3cm AAA.  I discussed the procedure with him and advised him of the risks and alternatives.  Questions both encouraged and answered and he has consented to proceed.    RECOMMENDATIONS/PLAN:  ? Open repair AAA  ? Epidural for post op pain management particularly given COPD  ? ICU post op        Tish Men

## 2016-03-15 NOTE — Anesthesia Post-Procedure Evaluation (Addendum)
Patient Name: Eduardo Khan.  Procedures performed: Procedure(s):  AORTOBILILIAC ABDOMINAL ANEUYRISM REPAIR; ENDARTERECTOMY RIGHT ILIAC, PROFUNDA  AND SUPERFICIAL FEMORAL ARTERY    Last Vitals:   Vitals:    03/15/16 0623   BP: (!) 158/102   Pulse: 110   Resp: 20   Temp: 36.3 ?C   SpO2: 98%       Planned Anesthesia Type: general  Final Anesthesia Type: general  Patients Current Location: PACU  Level of Consciousness: awake, alert, responds appropriately and oriented  planned opioid use   Post Procedure Pain:adequate analgesia  Airway: Patent  Respiratory Status: nasal cannula, nonlabor ventilation and unassisted  Cardio Status: hemodynamically stable  Hydration: adequately hydrated  PONV prophylaxis Ordered  PONV? NO  no anesthesia complication,       The patient was able to participate in the post op evaluation, but has not recovered from regional anesthetic and is not expected to recover within 48 hours    Comments:VSS, spontaneous ventilation with face mask, thoracic epidural in place with orders for continuous infusion.       Jaclyn Short  3:08 PM

## 2016-03-15 NOTE — Brief Op Note (Signed)
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 rupture (CMS/HCC) [I71.4]     Surgeons: Surgeon(s) and Role:     Coletta Memos, MD - Assisting     * Gara Kroner, MD - Primary     * Waldron Labs, MD - Resident - Assisting    Assistant(s): * No surgical staff found *     Anesthesia Provider: CRNA: Durene Cal, CRNA  Anesthesia Technician: Harlow Asa; Pasty Arch  Anesthesia SRNA: Melynda Ripple    Anesthesia Type: General    Height & Weight:193 cm (76) & 83.5 kg (184 lb)     Specimens:   ID Type Source Tests Collected by Time Destination   A : RIGHT External Iliac Tissue Artery, plaque TISSUE EXAM Gara Kroner, MD 03/15/2016 1244               Implants:  Implant Name Type Inv. Item Serial No. Manufacturer Lot No. LRB No. Used Action   GRAFT VASC [facility] STRETCH [id number] - [id number] Graft GRAFT VASC [facility] STRETCH [id number]  [employer] ([place] [id number] N/A 1 Implanted   GRAFT VASC PERI 0.8X8CM VASCUGUARD - [id number] Graft GRAFT VASC PERI 0.8X8CM VASCUGUARD  [employer] ([place]) [id number] Right 1 Implanted   GRAFT VASC PERI 0.8X8CM VASCUGUARD - [id number] Graft GRAFT VASC PERI 0.8X8CM VASCUGUARD   [employer] ([place]) [id number] Right 1 Implanted       Antibiotics (admin):     Incision Time:   Anes Incision Time    No anesthesia events filed.          Fluids  EBL- 1160cc  UOP- 635cc  Crystalloid- 4500cc  Cell saver- 675cc    Urethral Catheter:  yes    Findings: bifurcated aortic graft to L CIA and R EIA with R IIA oversewn, R external, common, SFA, and profunda endarterectomy    Complications: None     Disposition: PACU - hemodynamically  stable.    Waldron Labs

## 2016-03-15 NOTE — Anesthesia Procedure Notes (Addendum)
Thoracic Epidural Block    Patient location during procedure: PAH  Start time: 03/15/2016 7:10 AM  End time: 03/15/2016 7:20 AM  Reason for block: at surgeon's request and post-op pain management  Staffing  Resident/CRNA: Durene Cal  Other anesthesia staff: Melynda Ripple  Performed: CRNA and SRNA   Preanesthetic Checklist  Completed: patient identified, site marked, surgical consent, pre-op evaluation, timeout performed, IV checked, risks and benefits discussed and monitors and equipment checked  Epidural  Patient position: sitting  Prep: ChloraPrep and site prepped and draped  Patient monitoring: heart rate, EKG, continuous pulse ox and frequent blood pressure checks  Approach: midline  Location: thoracic (1-12) (T9/10)  Injection technique: LOR saline  Needle  Needle type: Tuohy   Needle gauge: 17 G  Needle length: 3.5 in  Needle insertion depth: 6 cm  Catheter type: side hole  Catheter size: 19 G  Catheter at skin depth: 11 cm  Test dose: lidocaine 1.5% with epinephrine 1-to-200,000 and negative  Assessment  No complications noted.  Procedure Well Tolerated.  Patient Comfortable.  Full Eavluation Pending.    Block Success: successful block, appropriate sensory block and pain improved.

## 2016-03-15 NOTE — Anesthesia Procedure Notes (Addendum)
Central Venous Line:    A central venous line was placed using ultrasound guidance, in the OR for the following indication(s): central venous access and CVP monitoring.  Staffing:  SRNA / Other: SHORT, JACLYN  Performed by: CRNA and SRNA / Other     Sterility preparation included the following: hand hygiene performed prior to procedure, maximum sterile barriers used and sterile technique used to drape from head to toe.    The patient was placed in supine position.The right internal jugular vein was prepped.    The site was prepped with Chloraprep.  A 7 Fr (size), 20 (length), triple lumen was placed.  This catheter was not an oximetric catheter.    During the procedure, the following specific steps were taken: target vein identified, needle advanced into vein and blood aspirated and guidewire advanced into vein.Seldinger technique used    Intravenous verification was obtained by ultrasound and venous blood return.    Post insertion care included: all ports aspirated, all ports flushed easily, guidewire removed intact, Biopatch applied, line sutured in place and dressing applied.    During the procedure the patient experienced: patient tolerated procedure well with no complications.        Additional notes:  Manometry negative

## 2016-03-15 NOTE — Anesthesia Pre-Procedure Evaluation (Addendum)
Anesthesia Evaluation      History of anesthetic complications: denies PONV.   Airway   Mallampati: II  TM distance: > 8 cm  Neck ROM: limited  Dental    (+) chipped and missing    Pulmonary    (+) pneumonia, COPD moderate, wheezes,     ROS comment: smoker  Cardiovascular   (+) hypertension, dysrhythmias (afib),     Rhythm: irregular  Rate: normal  PE comment: afib    Neuro/Psych      GI/Hepatic/Renal      Endo/Other      Comments: thrombocytopenia   Musculoskeletal  (+) arthritis, lower back pain      Comments: Chronic pain  Lumbosacral radiculopathy            npo greater than 8 hours    Anesthesia Plan    ASA 3     general   (Discussed possibility of post op ventilation, blood transfusion with wife and daughters. )  intravenous induction   Anesthetic Plan, Alternatives, Risks and Questions discussed with patient and spouse.    Pre-Anesthesia Evaluation Completed at:  03/15/2016 6:50 AM

## 2016-03-16 LAB — CBC WITHOUT DIFFERENTIAL
HCT: 37.3 % — ABNORMAL LOW (ref 42.0–54.0)
Hemoglobin: 12.7 g/dL (ref 12.0–18.0)
MCH: 33.9 pg (ref 27.0–34.0)
MCHC: 34.1 g/dL (ref 32.0–36.0)
MCV: 99.6 fL — ABNORMAL HIGH (ref 81.0–99.0)
MPV: 8.8 fL (ref 7.4–10.4)
Platelet Count: 78 10*3/ÂµL — ABNORMAL LOW (ref 150–400)
RBC: 3.75 10*6/ÂµL — ABNORMAL LOW (ref 4.70–6.10)
RDW: 13.3 % (ref 11.5–14.5)
WBC: 11 10*3/ÂµL — ABNORMAL HIGH (ref 4.8–10.8)

## 2016-03-16 LAB — BASIC METABOLIC PANEL
Anion Gap: 5 mmol/L (ref 3.0–11.0)
BUN: 20 mg/dL (ref 6–23)
CO2 - Carbon Dioxide: 24 mmol/L (ref 21.0–31.0)
Calcium: 7.7 mg/dL — ABNORMAL LOW (ref 8.6–10.3)
Chloride: 111 mmol/L (ref 98–111)
Creatinine: 0.83 mg/dL (ref 0.65–1.30)
GFR Estimate: >60 mL/min/{1.73_m2} (ref >=60)
Glucose: 124 mg/dL — ABNORMAL HIGH (ref 80–99)
Potassium: 3.9 mmol/L (ref 3.5–5.1)
Sodium: 140 mmol/L (ref 135–143)

## 2016-03-16 LAB — CBC WITH AUTO DIFFERENTIAL
Basophils %: 0 % (ref 0–2)
Basophils, Absolute: 0 10*3/ÂµL (ref 0.0–0.2)
Eosinophils %: 0 % (ref 0–7)
Eosinophils, Absolute: 0 10*3/ÂµL (ref 0.0–0.7)
HCT: 39.9 % — ABNORMAL LOW (ref 42.0–54.0)
Hemoglobin: 13.3 g/dL (ref 12.0–18.0)
Lymphocytes %: 8 % — ABNORMAL LOW (ref 25–45)
Lymphocytes, Absolute: 0.9 10*3/ÂµL — ABNORMAL LOW (ref 1.1–4.3)
MCH: 33.1 pg (ref 27.0–34.0)
MCHC: 33.4 g/dL (ref 32.0–36.0)
MCV: 99.2 fL — ABNORMAL HIGH (ref 81.0–99.0)
MPV: 9.1 fL (ref 7.4–10.4)
Monocytes %: 7 % (ref 0–12)
Monocytes, Absolute: 0.8 10*3/ÂµL (ref 0.0–1.2)
Neutrophils %: 84 % — ABNORMAL HIGH (ref 35–70)
Neutrophils, Absolute: 9.5 10*3/ÂµL — ABNORMAL HIGH (ref 1.6–7.3)
Platelet Count: 83 10*3/ÂµL — ABNORMAL LOW (ref 150–400)
RBC: 4.03 10*6/ÂµL — ABNORMAL LOW (ref 4.70–6.10)
RDW: 13.1 % (ref 11.5–14.5)
WBC: 11.3 10*3/ÂµL — ABNORMAL HIGH (ref 4.8–10.8)

## 2016-03-16 LAB — PROTIME-INR
INR: 1.4 — ABNORMAL HIGH (ref 0.9–1.2)
Protime: 16.5 Seconds — ABNORMAL HIGH (ref 9.9–13.4)

## 2016-03-16 LAB — MAGNESIUM: Magnesium: 1.4 mg/dL — ABNORMAL LOW (ref 1.6–2.4)

## 2016-03-16 MED ORDER — magnesium sulfate in water 4 gram/50 mL (8 %) IVPB 4 g
4 | Freq: Once | INTRAVENOUS | Status: AC
Start: 2016-03-16 — End: 2016-03-16
  Administered 2016-03-16: 18:00:00 4508 g via INTRAVENOUS
  Administered 2016-03-16: 17:00:00 4 g via INTRAVENOUS

## 2016-03-16 MED ORDER — diphenhydrAMINE (BENADRYL) injection 12.5 mg
50 | Freq: Four times a day (QID) | INTRAMUSCULAR | Status: DC | PRN
Start: 2016-03-16 — End: 2016-03-20

## 2016-03-16 MED ORDER — ondansetron (ZOFRAN) injection 4 mg
4 | Freq: Four times a day (QID) | INTRAMUSCULAR | Status: DC | PRN
Start: 2016-03-16 — End: 2016-03-20
  Administered 2016-03-17 – 2016-03-18 (×2): 4 mg via INTRAVENOUS

## 2016-03-16 MED ORDER — famotidine (PEPCID) tablet 20 mg
20 | Freq: Two times a day (BID) | ORAL | Status: DC
Start: 2016-03-16 — End: 2016-03-20
  Administered 2016-03-17 – 2016-03-20 (×8): 20 mg via ORAL

## 2016-03-16 MED ORDER — ipratropium-albuterol (DUO-NEB) 0.5 mg-3 mg(2.5 mg base)/3 mL nebulizer solution 3 mL
0.5 | Freq: Four times a day (QID) | RESPIRATORY_TRACT | Status: DC | PRN
Start: 2016-03-16 — End: 2016-03-20
  Administered 2016-03-20: 04:00:00 0.5 mL via RESPIRATORY_TRACT

## 2016-03-16 MED ORDER — HYDROmorphone (DILAUDID) 1 mg/mL (1 mL) syringe 0.5-1 mg
1 | INTRAVENOUS | Status: DC | PRN
Start: 2016-03-16 — End: 2016-03-17
  Administered 2016-03-16 – 2016-03-17 (×7): 1 mg via INTRAVENOUS

## 2016-03-16 MED ORDER — polyethylene glycol (MIRALAX) packet 17 g
17 | Freq: Every day | ORAL | Status: DC
Start: 2016-03-16 — End: 2016-03-19
  Administered 2016-03-16 – 2016-03-18 (×3): 17 g via ORAL

## 2016-03-16 MED ORDER — sodium chloride 0.9 % (NS) syringe 10 mL
Freq: Three times a day (TID) | INTRAMUSCULAR | Status: DC
Start: 2016-03-16 — End: 2016-03-20
  Administered 2016-03-16 – 2016-03-20 (×12): via INTRAVENOUS

## 2016-03-16 MED ORDER — arformoterol (BROVANA) nebulizer solution 15 mcg
15 | Freq: Two times a day (BID) | RESPIRATORY_TRACT | Status: DC
Start: 2016-03-16 — End: 2016-03-20
  Administered 2016-03-16 – 2016-03-20 (×10): 15 ug via RESPIRATORY_TRACT

## 2016-03-16 MED ORDER — lactated ringers bolus 500 mL
Freq: Once | INTRAVENOUS | Status: AC
Start: 2016-03-16 — End: 2016-03-16
  Administered 2016-03-16 (×2): via INTRAVENOUS

## 2016-03-16 MED ORDER — acetaminophen (TYLENOL) suppository 650 mg
650 | RECTAL | Status: DC | PRN
Start: 2016-03-16 — End: 2016-03-20

## 2016-03-16 MED ORDER — metoprolol tartrate (LOPRESSOR) tablet 12.5 mg
25 | Freq: Three times a day (TID) | ORAL | Status: DC
Start: 2016-03-16 — End: 2016-03-17
  Administered 2016-03-16: 17:00:00 25 mg via ORAL

## 2016-03-16 MED ORDER — fluticasone-salmeterol (ADVAIR) 250-50 mcg/dose diskus inhaler 1 puff
250-50 | Freq: Two times a day (BID) | RESPIRATORY_TRACT | Status: DC
Start: 2016-03-16 — End: 2016-03-15

## 2016-03-16 MED ORDER — diphenhydrAMINE (BENADRYL) capsule 25 mg
25 | Freq: Four times a day (QID) | ORAL | Status: DC | PRN
Start: 2016-03-16 — End: 2016-03-20

## 2016-03-16 MED ORDER — naloxone (NARCAN) injection 0.08 mg
0.4 | INTRAMUSCULAR | Status: DC | PRN
Start: 2016-03-16 — End: 2016-03-15

## 2016-03-16 MED ORDER — heparin, porcine (PF) syringe 5,000 Units
5000 | Freq: Three times a day (TID) | INTRAMUSCULAR | Status: DC
Start: 2016-03-16 — End: 2016-03-19
  Administered 2016-03-16 – 2016-03-19 (×7): via SUBCUTANEOUS

## 2016-03-16 MED ORDER — phenol (CHLORASEPTIC) 1.4 % throat spray 1-2 spray
1.4 | Status: DC | PRN
Start: 2016-03-16 — End: 2016-03-20

## 2016-03-16 MED ORDER — tiotropium (SPIRIVA) inhalation 18 mcg
18 | Freq: Every day | RESPIRATORY_TRACT | Status: DC
Start: 2016-03-16 — End: 2016-03-15

## 2016-03-16 MED ORDER — nicotine (NICODERM CQ) 21 mg/24 hr patch 1 patch
21 | Freq: Every day | TRANSDERMAL | Status: DC
Start: 2016-03-16 — End: 2016-03-20
  Administered 2016-03-16 – 2016-03-20 (×5): 21 via TRANSDERMAL

## 2016-03-16 MED ORDER — aluminum-magnesium hydroxide-simethicone (MAALOX PLUS) 200-200-20 mg/5 mL oral suspension 30 mL
200-200-20 | ORAL | Status: DC | PRN
Start: 2016-03-16 — End: 2016-03-20

## 2016-03-16 MED ORDER — lactated Ringers infusion
INTRAVENOUS | Status: DC
Start: 2016-03-16 — End: 2016-03-18
  Administered 2016-03-16 – 2016-03-18 (×9): via INTRAVENOUS

## 2016-03-16 MED ORDER — HYDROmorphone (DILAUDID) 1 mg/mL (1 mL) syringe 0.2-0.6 mg
1 | INTRAVENOUS | Status: DC | PRN
Start: 2016-03-16 — End: 2016-03-16
  Administered 2016-03-16: 21:00:00 1 mg via INTRAVENOUS

## 2016-03-16 MED ORDER — prochlorperazine (COMPAZINE) injection 10 mg
10 | Freq: Four times a day (QID) | INTRAMUSCULAR | Status: DC | PRN
Start: 2016-03-16 — End: 2016-03-20

## 2016-03-16 MED ORDER — lactated ringers bolus 500 mL
Freq: Once | INTRAVENOUS | Status: AC
Start: 2016-03-16 — End: 2016-03-16
  Administered 2016-03-16 – 2016-03-17 (×2): via INTRAVENOUS

## 2016-03-16 MED ORDER — acetaminophen (TYLENOL) tablet 650 mg
325 | ORAL | Status: DC | PRN
Start: 2016-03-16 — End: 2016-03-20
  Administered 2016-03-18: 07:00:00 325 mg via ORAL

## 2016-03-16 MED ORDER — ipratropium (ATROVENT) 0.02 % nebulizer solution 0.5 mg
0.02 | Freq: Two times a day (BID) | RESPIRATORY_TRACT | Status: DC
Start: 2016-03-16 — End: 2016-03-20
  Administered 2016-03-16 – 2016-03-20 (×10): 0.02 mL via RESPIRATORY_TRACT

## 2016-03-16 MED ORDER — budesonide (PULMICORT) nebulizer suspension 0.5 mg
0.5 | Freq: Two times a day (BID) | RESPIRATORY_TRACT | Status: DC
Start: 2016-03-16 — End: 2016-03-20
  Administered 2016-03-16 – 2016-03-20 (×10): 0.5 mg via RESPIRATORY_TRACT

## 2016-03-16 MED ORDER — benzocaine-menthol (CEPACOL) lozenge 1 lozenge
15-2.6 | Status: DC | PRN
Start: 2016-03-16 — End: 2016-03-20

## 2016-03-16 MED ORDER — sodium chloride 0.9 % (NS) syringe 10 mL
INTRAMUSCULAR | Status: DC | PRN
Start: 2016-03-16 — End: 2016-03-20
  Administered 2016-03-16 – 2016-03-18 (×5): via INTRAVENOUS

## 2016-03-16 MED ORDER — labetalol (NORMODYNE,TRANDATE) injection 10 mg
5 | INTRAVENOUS | Status: DC | PRN
Start: 2016-03-16 — End: 2016-03-20

## 2016-03-16 MED ORDER — lactated ringers bolus 500 mL
Freq: Once | INTRAVENOUS | Status: AC
Start: 2016-03-16 — End: 2016-03-16
  Administered 2016-03-16 (×2): via INTRAVENOUS

## 2016-03-16 MED ORDER — sodium chloride (OCEAN) 0.65 % nasal spray 1-2 spray
0.65 | NASAL | Status: DC | PRN
Start: 2016-03-16 — End: 2016-03-20

## 2016-03-16 MED ORDER — famotidine (PF) (PEPCID) 20 mg in 0.9% NaCl IVPB Premix
20 | Freq: Two times a day (BID) | INTRAVENOUS | Status: DC
Start: 2016-03-16 — End: 2016-03-16
  Administered 2016-03-16 (×2): 20 mg via INTRAVENOUS
  Administered 2016-03-16 (×2): 2050 mg via INTRAVENOUS

## 2016-03-16 MED ORDER — aspirin 81 MG chewable tablet 81 mg
81 | Freq: Every day | ORAL | Status: DC
Start: 2016-03-16 — End: 2016-03-20
  Administered 2016-03-16 – 2016-03-20 (×5): 81 mg via ORAL

## 2016-03-16 MED FILL — METOPROLOL TARTRATE 25 MG TABLET: 25 mg | ORAL | Qty: 1

## 2016-03-16 MED FILL — MIRALAX 17 GRAM ORAL POWDER PACKET: 17 g | ORAL | Qty: 1

## 2016-03-16 MED FILL — ASPIRIN 81 MG CHEWABLE TABLET: 81 mg | ORAL | Qty: 1

## 2016-03-16 MED FILL — FAMOTIDINE (PF) IN 0.9% NACL 20 MG/50 ML IVPB PREMIX: 20 mg/50 mL | INTRAVENOUS | Qty: 50

## 2016-03-16 MED FILL — LACTATED RINGERS INTRAVENOUS SOLUTION: INTRAVENOUS | Qty: 1000

## 2016-03-16 MED FILL — IPRATROPIUM BROMIDE 0.02 % SOLUTION FOR INHALATION: 0.02 % | RESPIRATORY_TRACT | Qty: 2.5

## 2016-03-16 MED FILL — HEPARIN, PORCINE (PF) 5,000 UNIT/0.5 ML SYRINGE: 5000 unit/0.5 mL | INTRAMUSCULAR | Qty: 0.5

## 2016-03-16 MED FILL — MAGNESIUM SULFATE 4 GRAM/50 ML (8 %) IN WATER INTRAVENOUS PIGGYBACK: 4 gram/50 mL (8 %) | INTRAVENOUS | Qty: 50

## 2016-03-16 MED FILL — BROVANA 15 MCG/2 ML SOLUTION FOR NEBULIZATION: 15 mcg/2 mL | RESPIRATORY_TRACT | Qty: 2

## 2016-03-16 MED FILL — BUDESONIDE 0.5 MG/2 ML SUSPENSION FOR NEBULIZATION: 0.5 | RESPIRATORY_TRACT | Qty: 2

## 2016-03-16 MED FILL — HYDROMORPHONE 1 MG/ML (1 ML) IN 0.9 % SODUM CHLORIDE IV SYRINGE: 1 mg/mL | INTRAVENOUS | Qty: 1

## 2016-03-16 MED FILL — FENTANYL-ROPIVACAINE-NACL (PF) 2 MCG/ML-0.125 % EPIDURAL SOLUTION: 2-0.125 mcg/mL-% | EPIDURAL | Qty: 250

## 2016-03-16 MED FILL — NICOTINE 21 MG/24 HR DAILY TRANSDERMAL PATCH: 21 mg/24 hr | TRANSDERMAL | Qty: 1

## 2016-03-16 NOTE — Home Health (Signed)
[facility]     Order and Certification for Home Care Services      Patient Name: Eduardo Khan.   MRN: [id number]   DOB: 12/17/1940  Primary Care Physician: Levander Campion, MD     Home Health Agency: [facility]  Name of Physician who will continue orders and certifications: Levander Campion    Certification of Encounter  I certify that this patient is under my care and that I, or a nurse practitioner or physician's assistant working with me, had a face-to-face encounter that meets the physician face- to-face encounter requirements with this patient on 03/16/2016      The encounter with this patient was in whole or in part for the following diagnosis(es) and problems, which is the primary reason(s) I am ordering home health care:    Other: AAA repair      Clinical Findings that Support Medical Necessity of Home Health Services  I have initiated the establishment of a plan of care and referred this patient to a community physician who will follow and periodically review the plan of care.  Listed below are my clinical findings and how they support the patient's need for each skilled service(s).    Skilled Nursing: Disease process, Assessment and/or teaching, Medication management and Wound care and/or wound vac  Physical Therapy: Evaluation and treatment, Gait training, Strengthening and Mobility training  Occupational Therapy: Evaluation and treatment, Training related to ADLs , Energy conservation, Home safety evaluation and Family/caregiver training      Clinical Findings that Support Homebound Status  This patient is confined to the home (i.e. absences from home are for medical reasons or religious services or infrequently or of short duration when for other reasons)  because an illness or injury renders him/her normally unable to leave home except with the assistance of another person and/or the aid of a supportive device as well as for the following reasons :    Inability to safely  ambulate without assisted device and/or personal assistance    Authorized and Electronically Signed:Edwing Figley Evelene Croon    Date:  03/20/2016

## 2016-03-16 NOTE — Op Note (Signed)
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    PROCEDURE PERFORMED:  1.  Aortobiiliac bypass graft repair of infrarenal abdominal aortic aneurysm,   right common iliac artery aneurysm to left mid common iliac artery, right   external iliac artery.  2.  Thromboendarterectomy infrarenal aorta.  3.  Endarterectomy, right distal external iliac artery.  4.  Endarterectomy, right superficial femoral and profunda femoral arteries.    SURGEON:  Tish Men, MD    ASSISTANT SURGEON:  Stephens Shire, MD    ASSISTANT SURGEON:  Waldron Labs, MD    ANESTHESIA:  General endotracheal.    ESTIMATED BLOOD LOSS:  1160 mL, 675 given back as Cell Saver.    URINE OUTPUT:  635 mL.    CRYSTALLOID:  4500 mL.    INDICATIONS:  The patient is a 75 year old gentleman with a 5.3 cm infrarenal   abdominal aortic aneurysm with a saccular component and a 6 mm growth interval   over 6 months.  In addition, he had aneurysmal enlargement of the right common   iliac artery with dissection, although stenotic disease of the right external   iliac artery.  He was recommended for repair.  Based on his anatomy, he was felt  to be a poor candidate for an endovascular stent graft repair given high   bifurcation of the right femoral artery, which was intra-abdominal, the   disease at the right external iliac artery and the right common iliac artery   aneurysmal change that would necessitate embolization of hypogastric artery   landing onto the external iliac.  He was recommended for an open approach.  He   underwent Lexiscan myocardial perfusion study, which showed a low probability   for flow-limiting coronary artery disease.  He was recommended for an open      repair.  The procedure was discussed with him at length.  He was advised of   risks and alternatives.  Questions were both encouraged and answered and he   consented to proceed.    FINDINGS:  A relatively uneventful anastomosis of a 22 x 11 bifurcated PTFE   graft in the infrarenal aorta and left mid common iliac artery.  The right iliac  bifurcation was fairly diseased and would not hold sutures, even pledgeted   reinforced sutures, due to a pull-through necessitating oversewing of the   hypogastric artery, endarterectomy of the external iliac artery and anastomosing  directly end-to-end to the external iliac artery on the right.  We had weak   pulses on the right.  We thromboendarterectomized the external iliac artery   through a second retroperitoneal approach distally with completion bovine   pericardial patch angioplasty as well as to the profunda femoral and superficial  femoral arteries, Fogarty catheters, thromboembolectomy of the superficial   femoral artery was accomplished after which we had dopplerable signals on the   foot.  The likely etiology for the thrombosis was a severe ulcerated nearly   occlusive plaque at the femoral bifurcation, which was proximal to the inguinal   ligament, effectively in a no man's land, very difficult to get to, which was   the reason for the retroperitoneal right lower quadrant oblique incision.  Following the extended endarterectomy of the distal external iliac artery and   the profunda and superficial femoral arteries, we had dopplerable signals, very   strong pulse at the right femoral level, much improved over preintervention   baseline.    PROCEDURE DESCRIPTION:  Informed consent was verified.  The patient was properly  identified and brought to the operating theater where he was placed in a supine   position on the operating room table.  He had undergone an uneventful placement   of an epidural catheter per anesthesia.  He underwent smooth induction of   general  endotracheal anesthesia.  A Foley catheter was placed per nursing staff.   He was prepped and draped in standard fashion.  A timeout was held and the   patient identification confirmed as were surgical site, indications, antibiotic   administration.  A vertical midline incision was made from the xiphoid to the   pubis with a 15-blade scalpel, extended with Bovie cautery down to the fascia,   which was divided centrally.  We entered the abdomen under direct visualization,  extended the peritoneal mobilization with Bovie current.  Due due his very thin   body habitus, we had nice visualization.  The small bowel was packed off to the   right.  An Omni retraction system was placed to facilitate retraction and   exposure.  The peritoneum was divided in the posterior midline starting over the  aneurysm extended to allow [place] to mobilize the duodenum to the right up to the   level of the ligament of Treitz.  We dissected up to the left renal vein and   that was a landmark for the left renal artery, very easily dissected   circumferentially around the infrarenal aorta at this level controlled with   circumferentially placed large Silastic vessel loop.  Distally, we extended the   division of the peritoneum onto the right common iliac artery, distally   mobilized the neurovascular pedicle over the left common iliac arteries gently   to the left with retractors.  We dissected out the left mid common iliac artery   sufficient to place a vessel loop and tested a Cosgrove clamp, atraumatic, which  fit nicely.  On the right, we dissected to the iliac bifurcation, individually   controlled the hypogastric artery and the external iliac artery.  When this was   all completed, we systemically heparinized with 5000 units of heparin.  After   sufficient circulatory time, we clamped the infrarenal aorta with an Cherry   aortic clamp, placed a Cosgrove atraumatic clamp to the left mid common iliac   artery and to the right hypogastric and  external iliac arteries.  An arteriotomy  was made with an 11-blade scalpel over the aneurysm extended with Cosgrove   scissors proximally where we T'd off the infrarenal aortic neck.  We removed   thrombus from within the aneurysm sac oversewed lumbars with 0 silk, oversewed   the internal mesenteric artery from within with an 0 silk and the middle sacral   artery.  Distally, we opened up onto the aneurysmal right common iliac artery,   which was quite saccular with the dissection and extended this to the iliac   bifurcation.  We had selected a 22 x 11 graft based on measurements using graft   sizers.  This was selected.  The main body was shortened.  A primary end-to-end   anastomosis was then completed with a 2-0 Prolene suture starting in the  posterior midline running to doubly armed limbs of the suture in continuity   around the margin of the aortotomy proximally to secure the graft.  A 2-0   Prolene was secured over a Dacron pledget.  Carmalt clamps were placed to the   limbs of the graft.  We opened up to test our anastomosis and our suture line   was hemostatic.  The left anastomosis was completed to the mid common iliac   artery, and this was done, end-to-end fashion, beveling the graft to match.  We   opened up the arteriotomy to the mid common iliac artery leaving the posterior   midline intact.  This anastomosis was completed with a 4-0 Prolene suture   starting in the posterior midline running to doubly armed limbs of the suture in  continuity around the margin of the anastomosis.  Just prior to completing it,   we back bled the common iliac artery, flushed it with heparin saline solution,   flushed the graft antegrade.  We opened up the left lower extremity with   sufficient forewarning to anesthesia and we had a dopplerable signal at the   posterior tibial level following opening up.  On the right, we attempted a   direct anastomosis to the iliac bifurcation, beveled our graft.  The posterior      midline of the iliac artery, however, was really quite fragile.  So this was   done with pledgeted sutures to reinforce this along the posterior margin of the   anastomosis.  We constructed this anastomosis with 4-0 Prolene.  Just prior to   securing this, we back bled the hypogastric artery, the external iliac artery,   flushed everything with heparin saline solution, opened up to the right.  We had  quite a bit of bleeding from the posterior midline, so we once again clamped the  iliacs, placed a Carmalt to the right limb of the graft and revised this   anastomosis, but yet again were just found to have and insufficiently suitable   substrate for showing the graft in the posterior midline.  So a decision was   made to oversew the hypogastric artery, which we have done so in double layer   4-0 Prolene suture.  We endarterectomized the proximal external iliac artery,   tacked the endpoint with 7-0 Prolene sutures and did a primary end-to-end   anastomosis, this time with 5-0 Prolene suture.  This necessitated an extension   limb on our graft, so we divided the right limb of the graft, sewed an extension  limb beveled on the graft-to-graft with a 4-0 Prolene suture, bevelled the graft  distally, anastomosed it to the external iliac artery.  We opened up to the   right lower extremity.  We had a wonderful pulse at the external iliac level,   but not at the femoral level with no dopplerable signals to the right lower   extremity.  Based on his high bifurcation, which was intra-abdominal, we exposed  the distal external iliac artery through an oblique retroperitoneal incision,   greatly facilitated by his thin body habitus.  We dissected down to the external  iliac level where he had very firm, dense plaque, no pulse at this level.  In   order to control our endpoint to the endarterectomy, the femoral bifurcation was  effectively in no man's land, right at the inguinal ligament.  We exposed the   superficial femoral  arteries and profunda femoral arteries through our  standard   medial groin incision distally, controlled them with doubly looped Silastic   vessel loop.  We placed a Carmalt clamp to our aortoiliac limb on the right,   pulled our vessel loops distally to tension.  We opened up the external iliac   artery distally with an 11-blade scalpel, extended it with Potts scissors.  What  we found was acute thrombus and a severely ulcerated, narrowly occlusive plaque   at the femoral bifurcation, and this was endarterectomized retrograde up to our   anastomosis distally to the bifurcation.  We made an arteriotomy in the   superficial femoral artery and profunda femoral arteries distal to the inguinal   ligament and endarterectomized retrograde to make sure that we had good endpoint  control.  When this was done, the 3 arteriotomies were repaired with bovine   pericardial patch angioplasty secured with 5-0 Prolene suture.  Everything was   flushed with heparin saline solution appropriately.  Prior to closing the   superficial femoral artery, we did pass a Fogarty as we had weak backbleeding.    We did retrieve some thrombus from the superficial femoral artery, but we were   able to pass the Fogarty to the below-knee level.  A second pass was free of any  thrombus.  We secured the bovine pericardial patch at the superficial femoral   artery.  We opened up the profunda femoral artery first, then to the superficial  femoral arteries with strong palpable pulses at the groin level.  We now   dopplerable signal at the foot.  All of the wounds were made hemostatic.  Ancef   antibiotic powder was applied to the main body of the graft within the abdomen.   We closed the aneurysm sac over the main body of the graft for the abdominal   aortic aneurysmal component and the right common iliac artery aneurysm sac over   the right limb of the graft.  This was done with 2-0 Vicryl sutures.  2-0   Vicryls were then used to close the peritoneum  in the midline to reperitonealize  the graft.  The abdominal retractors and sponges were all removed.  We had a   nice dopplerable signal within the sigmoid mesocolon and palpable pulses   throughout the small bowel mesentery.  The abdomen was irrigated.  The fascia   was closed with #1 PDS, the skin closed with staples.  The oblique   retroperitoneal incision on the right was closed with 3-0 Vicryl to   reapproximate the fascia of the internal and external oblique, 3-0 Vicryl to   close the dermis and the skin closed with staples.  The midline wound was closed  with staples similarly.  The groin was closed in standard fashion with   interrupted 3-0 Vicryl to reapproximate the fascial layers from deep to   superficial, dermis with 3-0 Vicryl, skin with a running 3-0 nylon suture.  All   incisions were cleansed and a dry gauze applied followed by Medipore.  The   patient was awakened from general anesthesia and taken in stable condition to   the postanesthesia care unit.  Vascular exam showed dopplerable signals in both   lower extremities.    Tish Men, MD    MJ/nl  D:  03/16/2016  12:39 P   T:  03/16/2016  1:48 P   TID:  [id number]  RECEIPT:    [id number]

## 2016-03-16 NOTE — Progress Notes (Addendum)
1900: Bedside report received from Karmi, day shift RN.  Continue CCU SOC.  Assumed care of patient in room 209.  Patient is resting quietly.  Used doppler with Karmi, RN - R pedal and post tib, and L pedal pulses dopplerable.  Patient's epidural is currently off due to hypotension during day shift.  Patient's vital signs are currently stable and pain has been managed with IV dilaudid.  Patient states he is a 3/10 pain at shift change - dilaudid administered 1 hour prior from [place], [place].  0100: Patient is not maintaining adequate oxygenation with nasal cannula increased at 4L.  Respiratory paged and a simple mask at 6L placed.  Patient is now oxygenating sufficiently at 91%.  Dr. Frederik Schmidt notified.  0245: Patient has remained very painful through the shift.  Blood pressure and urine output have been sufficient and epidural attempted to restart.  Epidural stated occlusion and there was mild swelling and a small amount of blood at the epidural insertion site.  Called and notified Dr. Frederik Schmidt.  Dr. Darrick Meigs dilaudid PCA, IV dilaudid for breakthrough pain, lidocaine patches, and IV tylenol.  0500: Patient's blood pressure dropped to 70/50s with MAPs <60.  After assessing the patient, the patient was disoriented to time, place, and situation.  Patient's pedal pulses were notably less audible during time of hypotension.  Dr. Frederik Schmidt called and notified.  250 LR bolus given.  Patient responded well and hypotension resolved.  Pulses return to normal on the doppler at approx 0600.  0700: Bedside report given to Marchelle Folks, RN and Santina Evans, Charity fundraiser.  Care relinquished.  Continue CCU SOC.  Doppler of the R and L pedal pulses and R post tibial pulses performed with Marchelle Folks, RN and Santina Evans, RN.

## 2016-03-16 NOTE — Progress Notes (Addendum)
Received bedside report from Leotis Shames, RN and care assumed.  Critical care standards of care continued.  Labs, medications, orders, kardex, notes reviewed.  Side rails up, bed in low/locked position, nurse call light/bedside table within reach, cardiac monitor on/alarms reviewed.  Pt is awake/alert during bedside handoff.  Doppler pulses as moderate on the RLE and moderate on the LLE pedal (DP absent).    1900-Bedside report given to Leotis Shames, RN and Evonnie Pat, RN.  Doppler pulses at handoff as unchanged from morning handoff.  Care relinquished.

## 2016-03-16 NOTE — Progress Notes (Signed)
PHARMACIST PROGRESS NOTE     The following pharmacy services were provided: IV to PO interventions    SUBJECTIVE / OBJECTIVE     Eduardo HARRY Borowski JR. is a 75 y.o. Male admitted on 03/15/2016.    ASSESSMENT       IV to PO Interventions  There are medication(s) identified as an IV to PO change:  Famotidine 20mg  IV q12h    PLAN       IV to PO Interventions  Changed to Famotidine 20mg  PO BID, as patient is tolerating other oral medications           Signed by: Bettey Costa, [facility]

## 2016-03-16 NOTE — Other (Signed)
Physical Therapy Evaluation    Patient name:  Eduardo Khan.  Date:  03/16/2016    ASSESSMENT    The patient was mod A with bed mobility, min A with transfers and able to sidestep 4' with FWW and min A. He fatigued rapidly.  He far from his baseline functional level of independent community ambulation without device.    DME Has: [facility]    Rehab Potential:  excellent    Discharge Recommendations: at current levels short stay NH.       PATIENT INFORMATION    Primary Diagnosis:    Patient Active Problem List   Diagnosis SNOMED CT(R)   . Community acquired bacterial pneumonia COMMUNITY ACQUIRED PNEUMONIA   . COPD (chronic obstructive pulmonary disease) (CMS/HCC) CHRONIC OBSTRUCTIVE LUNG DISEASE   . A-fib (CMS/HCC) ATRIAL FIBRILLATION   . History of spinal surgery H/O SPINAL SURGERY   . Lumbosacral radiculopathy LUMBOSACRAL RADICULOPATHY   . Onychomycosis due to dermatophyte ONYCHOMYCOSIS DUE TO DERMATOPHYTE   . Thrombocytopenia (CMS/HCC) PLATELET COUNT BELOW REFERENCE RANGE   . COPD with exacerbation (CMS/HCC) ACUTE EXACERBATION OF CHRONIC OBSTRUCTIVE AIRWAYS DISEASE   . Oral thrush CANDIDIASIS OF MOUTH       Surgery:  Procedure(s) (LRB):  AORTOBILILIAC ABDOMINAL ANEUYRISM REPAIR; ENDARTERECTOMY RIGHT ILIAC, PROFUNDA  AND SUPERFICIAL FEMORAL ARTERY (N/A)    Days Post surgery:  1 Day Post-Op    Pertinent Medical/Surgical Hx:    Past Medical History:   Diagnosis Date   . A-fib (CMS/HCC)    . A-fib (CMS/HCC)    . Cancer (CMS/HCC)     skin on hand and face   . Chronic pain     aching low back   . COPD (chronic obstructive pulmonary disease) (CMS/HCC)    . GERD (gastroesophageal reflux disease)     controlled   . Hypertension    . PONV (postoperative nausea and vomiting)     with morphine     Past Surgical History:   Procedure Laterality Date   . BACK SURGERY      L3/4 herniated disk 2015?   [facility] EYE SURGERY      both eyes, cataracts, right eye first of March   . PROCEDURE N/A 03/15/2016    Procedure: AORTOBILILIAC  ABDOMINAL ANEUYRISM REPAIR; ENDARTERECTOMY RIGHT ILIAC, PROFUNDA  AND SUPERFICIAL FEMORAL ARTERY;  Surgeon: Gara Kroner, MD;  Location: [facility] OR;  Service: General Surgery;  Laterality: N/A;       Precautions:  Log roll for comfort.     Hx of Onset:  75 y.o. male admitted for AAA and referred to PT for mobility assessment and discharge planning recommendations.    PLOF:  Independent community ambulation without device.    Social Situation:  Married;plans to stay at his daughters house to recover because it has no stairs.    Home Accessibility/ Stairs:   Level to level       SUBJECTIVE    Agreeable to PT but fearful of the pain related to mobility. He was reassured with the epidural and proper technique we could minimize the discomfort and that the benefits of mobility far outweigh the risks.    Pain: Pain does somewhat limit patient's functional ability at present.     Pre Treatment Pain Level:  2/10   Pain Level during activity:  5/10   Post Treatment Pain Level:  3/10       OBJECTIVE    Appliances:  CCU lines and monitors    Observation:  An elderly male supine in bed in NAD.    Mental Status/LOC/Orientation:  A&Ox4    Sensation:  Functional for mobility    Strength:  Functional grossly 4-/5 throughout    ROM:  Functional for transfers and gait    Balance:  Sit Static: F and staying away from 90 degrees.  Sit Dynamic: F  Stand Static: F-  Stand Dynamic: P    Bed Mobility: mod A      Transfers:  Min A    Gait:   [name] sidestepped 4 feet with FWW and min assist.    Specific Gait Issues/Limitations:  Poor on-foot tolerance     Stairs: TBD    Endurance:  poor    Vitals: No significant changes in vital signs noted.     BP =  109/63 HR =  82 bpm  Sp02 =  92% on 2.5 liter(s) oxygen    Today's Treatment:    Evaluation Completed and Goals addressed.  Patient left in room in bed with call light.  Phone left within reach.  Exit alarm on.  Instructed in benefits of increased out of bed  time.  Encouraged patient to be out of bed for 2 hours.        PLAN    Interventions:    Therapeutic Exercise       Bed Mobility Camera operator  DME/Equipment Assessment     Frequency:   1 X/day (M-F)   1 X/day (Saturday or Sunday)    Duration:  LOS, or as indicated      Therapist Goals (by discharge):    Patient/Caregiver GOALS reviewed and integrated with rehab treatment plan:   Multidisciplinary Problems (Active)        Problem: IP General Goal List - 1    Goal Priority Disciplines Outcome   Bed Mobility     PT    Description:  Pt. to perform bed mobility independently.    Transfers     PT    Description:  Patient to perform all functional transfers independently.    Ambulation - Levels     PT    Description:  Patient to ambulate independently x 350 feet with appropriate assistive device.                      Patient/Family Goal:  Home to his daughters to recover.      G-Codes/PSFS/FIM Scores: (if applicable)     % of Disability G Code FIM  Description   0%??????????  CH 7 Independent   1-19% CI 6 Device or extra time needed   20-39% CJ 5 Supervision/setup/cues must walk 50 ft independently may use device   40-59% CK 4 Min assist or 1 limb help must walk minimum of 150 ft   60-79% CL 3 Mod assist or 2 limb help must walk minimum of 150 ft   80-99% CM 2 Max assist or walks between 50 - 150 ft   100% CN 1 Total assist / mechanical or walks less than 50 ft                         Therapist/License #: Fard Borunda, PT    Start Time: 0835 /Stop Time: 0905  Start Time: 0952 /Stop Time: 1010     30  Evaluation (97162)  17 Therapeutic Activities [phone])

## 2016-03-16 NOTE — Progress Notes (Signed)
**THIS IS A MEDICAL STUDENT NOTE FOR EDUCATIONAL PURPOSES ONLY**        INPATIENT PROGRESS NOTE:  Name: Eduardo Khan.  MRN: [id number]  Date of Service: 03/16/2016    CC:  Open AAA repair    Subjective/Interval Hx:  - Transferred from OR to ICU  - NAE    Objective:  Vital Sign Ranges for Last 24 Hours:  BP  Min: 66/36  Max: 136/84  Pulse  Min: 66  Max: 98  Resp  Min: 13  Max: 31  Temp  Min: 37.3 ?C (99.2 ?F)  Max: 38.4 ?C (101.2 ?F)  SpO2  Min: 82 %  Max: 96 %  Vitals:    03/16/16 1745   BP:    Pulse: 84   Resp: 17   Temp:    SpO2: 93%       Intake/Output Summary (Last 24 hours) at 03/16/16 1837  Last data filed at 03/16/16 1800   Gross per 24 hour   Intake             4262 ml   Output              968 ml   Net             3294 ml       Physical Exam:  Alert, appropriate, appears uncomfortable with coughing but otherwise in NAD  Chest: non-labored, equal breath sounds bilaterally  Heart: well perfused, regular rate, irregularly irregular rhythm, no JVD  Abdomen: soft, non distended, appropriately tender  Incisions/dressings d/c/i: midline abdomen, right inguinal, right femoral with no surrounding tenderness, warmth or erythema  G/U: N/A  Drain: N/A  Ext: feet slightly cool to touch but not mottled or cyanotic in appearance, R DP and PT pulses palpable by Doppler, L DP pulse palpable, 2+ radial pulses bilaterally    Labs:   Lab Results   Component Value Date    NA 140 03/16/2016    NA 139 05/16/2013    K 3.9 03/16/2016    K 3.1 (L) 05/16/2013    CL 111 03/16/2016    CL 104 05/16/2013    BUN 20 03/16/2016    BUN 20 05/16/2013    GLU 124 (H) 03/16/2016    GLU 110 (H) 05/16/2013     Lab Results   Component Value Date    HCT 37.3 (L) 03/16/2016    HCT 54.2 (H) 05/16/2013    MCV 99.6 (H) 03/16/2016    MCV 99.4 (H) 05/16/2013    RDW 13.3 03/16/2016    RDW 13.6 05/16/2013         Assessment and Plan:  [name]. is a 75 y.o. male now POD1 s/p open AAA repair and right external iliac  endarterectomy.    I. Neuro  - Epidural fentanyl with ropivacaine 2-0.125mcg/mL at 21mL/hr, discontinued today due to hypotension  - Continue Dilaudid 0.5-1mg  IV q1h prn pain    II. Resp  - Home meds per RT (arfometerol, budesonide, ipratropium)  - IS/pulm hygiene  - Pt uses 2L home O2 at night, continue in CCU    III. CV  - Monitor on tele  - Peripheral pulse checks daily with doppler, notify for any changes (current palpable R PT and faint DP, L DP intact)  - ASA 81mg  qd  - Continue metoprolol home dose, hold for hypotension    IV. Renal  - Monitor UOP, goal >24mL/hr  - mIVF  - Daily  renal function panel    V. FEN/GI  - Clear liquid diet  - Miralax qd    VI. ID  - NTD    VI. Heme  - Continue heparin 5000 units q8h subQ  - CBC and coag panel qd    VII. MSK  - OOP as able  - Do not sit with right hip flexed at 90 degrees      The attending of record for the above patient encounter is Dr. Tish Men, MD.        Suezanne Cheshire, MS-3  [place] Medical Student  General Surgery        **THIS IS A MEDICAL STUDENT NOTE FOR EDUCATIONAL PURPOSES ONLY**

## 2016-03-16 NOTE — Progress Notes (Signed)
INPATIENT PROGRESS NOTE:  Name: Dontay Harm.  MRN: [id number]  Date of Service: 03/16/2016    CC:  Abdominal aortic aneurysm    Subjective/Interval Hx:  NAE overnight  AF, vss, UOP appropriate      Objective:  Vital Sign Ranges for Last 24 Hours:  BP  Min: 80/55  Max: 118/78  Pulse  Min: 60  Max: 98  Resp  Min: 13  Max: 28  Temp  Min: 36.3 ?C (97.3 ?F)  Max: 38.4 ?C (101.2 ?F)  SpO2  Min: 79 %  Max: 100 %  Vitals:    03/16/16 0836   BP: 91/64   Pulse:    Resp:    Temp:    SpO2:        Intake/Output Summary (Last 24 hours) at 03/16/16 5409  Last data filed at 03/16/16 8119   Gross per 24 hour   Intake             4911 ml   Output             2260 ml   Net             2651 ml       Physical Exam:  A&Ox3, NAD  Chest: non-labored  Heart: well perfused  Abdomen: soft, mildly distended, minimally tender  Incisions/dressings d/c/i: y  G/U: foley cyu, slightly concentrated  Ext: wwp.  +DP/PT on R, strong DP on L, right groin incision d/c/i w/o palpable hematoma    Labs:   Lab Results   Component Value Date    NA 140 03/16/2016    NA 139 05/16/2013    K 3.9 03/16/2016    K 3.1 (L) 05/16/2013    CL 111 03/16/2016    CL 104 05/16/2013    BUN 20 03/16/2016    BUN 20 05/16/2013    GLU 124 (H) 03/16/2016    GLU 110 (H) 05/16/2013     Lab Results   Component Value Date    HCT 39.9 (L) 03/16/2016    HCT 54.2 (H) 05/16/2013    MCV 99.2 (H) 03/16/2016    MCV 99.4 (H) 05/16/2013    RDW 13.1 03/16/2016    RDW 13.6 05/16/2013         Assessment and Plan:  Jurrell Royster. is a 75 y.o. male POD2 s/p AAA repair with bifurcated graft to L CIA, R EIA with ligation of R hypogastric, and endarterectomy of R EIA, CFA, SFA, and profunda and thromboembolectomy   -pca, tylenol  -pulmonary hygiene/IS, home inhalers  -decreasing metoprolol dose given hypotension, not ready for diuresis yet  -aspirin, will need to be on statin medication  -clears  -maintain foley  -heparin prophylaxis, SCDs  -pt, oob to chair today    The attending  of record for the above patient encounter is Dr. Tish Men.    Waldron Labs, MD  General Surgery, R4

## 2016-03-16 NOTE — Progress Notes (Addendum)
1900: Bedside report received from [employer], day shift RN.  Continue CCU SOC.  Pulses checked via doppler with Gweneth Dimitri, RN and Baxter Hire, RN - R post-tibial dopplerable and L pedal dopplerable.  Extremities cool, feet cap refill delayed.  Patient has 3 dressings, all intact.  Scant drainage to mid abdominal dressing.    0700: Bedside report given to Karmi, day shift RN.  Continue CCU SOC.  Doppler assessment performed with Judie Petit, RN at bedside.  Reviewed case with Dr. Frederik Schmidt at bedside.

## 2016-03-16 NOTE — Discharge Planning (AHS/AVS) (Addendum)
Update: I spoke with Florentina Addison from [facility] and she stated they can accept the patient and if the patient discharges on Friday they can see him on Saturday. Eligha Bridegroom, Case Management Specialist  03/16/2016 3:23 PM      Per Discharge Planner Delana Meyer, RN I sent a referral to [facility] for SN/PT/OT. The patient is anticipated to discharge Friday. Face to face pending MD signature.   Eligha Bridegroom, Case Management Specialist  03/16/2016 2:31 PM

## 2016-03-16 NOTE — Discharge Planning (AHS/AVS) (Addendum)
DISCHARGE PLANNING ASSESSMENT     Primary Care Physician: Levander Campion, MD Confirmed Pt's PCP and Contact info:   Yes     Plan: Discharge home with family. Dtrs and son in law plan on taking turns to provide 24 hour care for as long as needed.   Picking up DURABLE MEDICAL EQUIPMENT's at [facility] today ie walker, BSC, Shower bench. Wife also available to provide as much care as able. Family declining Skilled Nursing Facility. Home Health requested and [facility] chosen.     Patient/Family Goal: Return  Home with home health and family.  Family or Caregiver who is to be included in care planning: Wife, Okey Dupre [phone]  ASSESSMENT   Goals of Care conversation:          Current General Observations/Barriers:Pt alert and orientated. In bed with pillow at abdomen for pain relief. Family at bedside. Dtrs and son in law plan on taking turns trading weekly to assist patient 24 hours daily. All able and willing to provide mod assist for patient. Home health requested. Face to Face pending for MD signature. Patient lives in [place] area with his wife in dtrs home.     Patient and family requesting an Occupational Therapy eval, patient would like modification ideas for basic activities of daily living's to give him as much privacy as possible. Request for [place] because he uses at home and feels it works better for him then the IS. Verbal from Resident received.    Prior Level of Function  Potential Risk Factors: Change in care needs, Severity of injury/illness  Home Environment: Single level  Mentation: Oriented  Mobility: Ambulatory, Independent     Support Systems/Services (Current)  Living Arrangements: Spouse/significant other  Type of Residence: Private residence  Support Systems: Spouse/significant other  Home Health Agency: [facility]           INTERVENTIONS   Anticipated additional equipment needs: bedside commode, walker and shower chair  Do you anticipate patient will need medical  transport at discharge?: TBD       Additional Information:     Velna Ochs, RN

## 2016-03-16 NOTE — Progress Notes (Signed)
EPIDURAL PROGRESS NOTE    POD 1 status post AAA surgery   Dressing: Clean, Dry and Intact  Catheter site: Non-tender  Pain Score:: 8/10  Side effects: No Nausea, No Pruritis and No Sedation  Infusion: Ropivicaine 0.125% + Fentanyl 75mcg/mL  infusing at 0 ml/hr  Anticoagulants : Heparin 5000 Units SubQ BID    PHYSICAL EXAM  Vitals:    03/16/16 1530   BP: 109/63   Pulse: 84   Resp: 14   Temp:    SpO2: 94%     Mental: Awake, alert, responds appropritely  Motor: Bed rest    Assessment: Excellent Analgesia    Plan: Continue current therapy      Keith Rake  03/16/2016  3:40 PM

## 2016-03-17 LAB — CBC WITH AUTO DIFFERENTIAL
Basophils %: 1 % (ref 0–2)
Basophils, Absolute: 0.1 10*3/ÂµL (ref 0.0–0.2)
Eosinophils %: 0 % (ref 0–7)
Eosinophils, Absolute: 0 10*3/ÂµL (ref 0.0–0.7)
HCT: 36 % — ABNORMAL LOW (ref 42.0–54.0)
Hemoglobin: 12.3 g/dL (ref 12.0–18.0)
Lymphocytes %: 8 % — ABNORMAL LOW (ref 25–45)
Lymphocytes, Absolute: 0.9 10*3/ÂµL — ABNORMAL LOW (ref 1.1–4.3)
MCH: 33.6 pg (ref 27.0–34.0)
MCHC: 34.1 g/dL (ref 32.0–36.0)
MCV: 98.6 fL (ref 81.0–99.0)
MPV: 8.9 fL (ref 7.4–10.4)
Monocytes %: 7 % (ref 0–12)
Monocytes, Absolute: 0.8 10*3/ÂµL (ref 0.0–1.2)
Neutrophils %: 84 % — ABNORMAL HIGH (ref 35–70)
Neutrophils, Absolute: 9.5 10*3/ÂµL — ABNORMAL HIGH (ref 1.6–7.3)
Platelet Count: 78 10*3/ÂµL — ABNORMAL LOW (ref 150–400)
RBC: 3.66 10*6/ÂµL — ABNORMAL LOW (ref 4.70–6.10)
RDW: 13 % (ref 11.5–14.5)
WBC: 11.3 10*3/ÂµL — ABNORMAL HIGH (ref 4.8–10.8)

## 2016-03-17 LAB — BASIC METABOLIC PANEL
Anion Gap: 8 mmol/L (ref 3.0–11.0)
BUN: 21 mg/dL (ref 6–23)
CO2 - Carbon Dioxide: 25 mmol/L (ref 21.0–31.0)
Calcium: 7.6 mg/dL — ABNORMAL LOW (ref 8.6–10.3)
Chloride: 104 mmol/L (ref 98–111)
Creatinine: 0.89 mg/dL (ref 0.65–1.30)
GFR Estimate: >60 mL/min/{1.73_m2} (ref >=60)
Glucose: 110 mg/dL — ABNORMAL HIGH (ref 80–99)
Potassium: 4 mmol/L (ref 3.5–5.1)
Sodium: 137 mmol/L (ref 135–143)

## 2016-03-17 LAB — PROTIME-INR
INR: 1.3 — ABNORMAL HIGH (ref 0.9–1.2)
Protime: 15.4 Seconds — ABNORMAL HIGH (ref 9.9–13.4)

## 2016-03-17 LAB — MAGNESIUM: Magnesium: 1.9 mg/dL (ref 1.6–2.4)

## 2016-03-17 MED ORDER — HYDROmorphone (DILAUDID) PCA 1 mg/mL - Moderate dose for opiate naive
1 | INTRAVENOUS | Status: DC
Start: 2016-03-17 — End: 2016-03-18
  Administered 2016-03-18 (×2): 11 mg/ mL via INTRAVENOUS
  Administered 2016-03-18: 01:00:00 1 mg/ mL via INTRAVENOUS

## 2016-03-17 MED ORDER — acetaminophen (OFIRMEV) IV 1,000 mg
1000 | Freq: Once | INTRAVENOUS | Status: AC
Start: 2016-03-17 — End: 2016-03-17
  Administered 2016-03-17: 11:00:00 via INTRAVENOUS
  Administered 2016-03-17: 12:00:00 1000 mg via INTRAVENOUS

## 2016-03-17 MED ORDER — diphenhydrAMINE (BENADRYL) injection 12.5 mg
50 | Freq: Four times a day (QID) | INTRAMUSCULAR | Status: DC | PRN
Start: 2016-03-17 — End: 2016-03-17

## 2016-03-17 MED ORDER — naloxone (NARCAN) injection 0.08 mg
0.4 | INTRAMUSCULAR | Status: DC | PRN
Start: 2016-03-17 — End: 2016-03-17

## 2016-03-17 MED ORDER — morphine injection 1-4 mg
2 | INTRAVENOUS | Status: DC | PRN
Start: 2016-03-17 — End: 2016-03-17

## 2016-03-17 MED ORDER — metoprolol tartrate (LOPRESSOR) tablet 6.25 mg
25 | Freq: Three times a day (TID) | ORAL | Status: DC
Start: 2016-03-17 — End: 2016-03-19
  Administered 2016-03-17 – 2016-03-19 (×6): 25 mg via ORAL

## 2016-03-17 MED ORDER — HYDROmorphone (DILAUDID) PCA 1 mg/mL - Moderate dose for opiate naive
1 | INTRAVENOUS | Status: DC
Start: 2016-03-17 — End: 2016-03-17
  Administered 2016-03-17: 21:00:00 11 mg/ mL via INTRAVENOUS
  Administered 2016-03-17: 11:00:00 1 mg/ mL via INTRAVENOUS

## 2016-03-17 MED ORDER — morphine PCA 1 mg/mL -  Moderate dose for opiate naive
1 | INTRAVENOUS | Status: DC
Start: 2016-03-17 — End: 2016-03-17

## 2016-03-17 MED ORDER — sodium chloride 0.9 % infusion
INTRAVENOUS | Status: DC | PRN
Start: 2016-03-17 — End: 2016-03-18

## 2016-03-17 MED ORDER — dextrose 5 % (D5W) infusion
INTRAVENOUS | Status: DC | PRN
Start: 2016-03-17 — End: 2016-03-17
  Administered 2016-03-17 – 2016-03-18 (×3): via INTRAVENOUS

## 2016-03-17 MED ORDER — lidocaine (LIDODERM) 5 % patch 2 patch
5 | TOPICAL | Status: DC
Start: 2016-03-17 — End: 2016-03-20
  Administered 2016-03-17 – 2016-03-19 (×3): 5 via TRANSDERMAL

## 2016-03-17 MED FILL — ASPIRIN 81 MG CHEWABLE TABLET: 81 mg | ORAL | Qty: 1

## 2016-03-17 MED FILL — HYDROMORPHONE 1 MG/ML (1 ML) IN 0.9 % SODUM CHLORIDE IV SYRINGE: 1 mg/mL | INTRAVENOUS | Qty: 1

## 2016-03-17 MED FILL — LIDOCAINE 5 % TOPICAL PATCH: 5 % | TOPICAL | Qty: 2

## 2016-03-17 MED FILL — BROVANA 15 MCG/2 ML SOLUTION FOR NEBULIZATION: 15 mcg/2 mL | RESPIRATORY_TRACT | Qty: 2

## 2016-03-17 MED FILL — BUDESONIDE 0.5 MG/2 ML SUSPENSION FOR NEBULIZATION: 0.5 | RESPIRATORY_TRACT | Qty: 2

## 2016-03-17 MED FILL — METOPROLOL TARTRATE 25 MG TABLET: 25 mg | ORAL | Qty: 1

## 2016-03-17 MED FILL — MORPHINE (PF) 30 MG/30 ML (1 MG/ML) PCA INTRAVENOUS SOLUTION: 30 mg/30 mL (1 mg/mL) | INTRAVENOUS | Qty: 30

## 2016-03-17 MED FILL — DEXTROSE 5 % IN WATER (D5W) INTRAVENOUS SOLUTION: INTRAVENOUS | Qty: 1000

## 2016-03-17 MED FILL — MIRALAX 17 GRAM ORAL POWDER PACKET: 17 g | ORAL | Qty: 1

## 2016-03-17 MED FILL — IPRATROPIUM BROMIDE 0.02 % SOLUTION FOR INHALATION: 0.02 % | RESPIRATORY_TRACT | Qty: 2.5

## 2016-03-17 MED FILL — FAMOTIDINE 20 MG TABLET: 20 mg | ORAL | Qty: 1

## 2016-03-17 MED FILL — HEPARIN, PORCINE (PF) 5,000 UNIT/0.5 ML SYRINGE: 5000 unit/0.5 mL | INTRAMUSCULAR | Qty: 0.5

## 2016-03-17 MED FILL — NICOTINE 21 MG/24 HR DAILY TRANSDERMAL PATCH: 21 mg/24 hr | TRANSDERMAL | Qty: 1

## 2016-03-17 MED FILL — OFIRMEV 1,000 MG/100 ML (10 MG/ML) INTRAVENOUS SOLUTION: 1000 mg/100 mL (10 mg/mL) | INTRAVENOUS | Qty: 100

## 2016-03-17 MED FILL — ONDANSETRON HCL (PF) 4 MG/2 ML INJECTION SOLUTION: 4 | INTRAMUSCULAR | Qty: 2

## 2016-03-17 MED FILL — HYDROMORPHONE 30 MG/30 ML IN 0.9 % SOD. CHLORIDE PCA INTRAVENOUS SOLN: 30 mg/30 mL | INTRAVENOUS | Qty: 30

## 2016-03-17 NOTE — Progress Notes (Signed)
Central line dressing changed due to drainage on Biopatch. Patient tolerated without complaint. All lumens infusing at time of dressing change.

## 2016-03-17 NOTE — Progress Notes (Signed)
Nutrition Assessment -- ICU LOS    Patient meets ASPEN's criteria for moderate malnutrition in the context of chronic illness as evidenced by intake < 75% of estimated energy requirement for > 1 month, moderate body fat loss, moderate muscle mass loss.    Recommend:  1. Advance diet toward a Heart Healthy diet when feasible  2. Monitor for BM, not charted since admission    Pt is at Moderate-High Nutritional Risk    Full Nutrition Assessment follows below:     75 y.o. male  LOS: 2 days  Active Hospital Problems    Diagnosis SNOMED CT(R) Date Noted   . S/P abdominal surgery, follow-up exam SURGICAL FOLLOW-UP      Past Medical History:   Diagnosis Date   . A-fib (CMS/HCC)    . A-fib (CMS/HCC)    . Cancer (CMS/HCC)     skin on hand and face   . Chronic pain     aching low back   . COPD (chronic obstructive pulmonary disease) (CMS/HCC)    . GERD (gastroesophageal reflux disease)     controlled   . Hypertension    . PONV (postoperative nausea and vomiting)     with morphine     Past Surgical History:   Procedure Laterality Date   . BACK SURGERY      L3/4 herniated disk 2015?   [facility]land Kitchen EYE SURGERY      both eyes, cataracts, right eye first of March   . PROCEDURE N/A 03/15/2016    Procedure: AORTOBILILIAC ABDOMINAL ANEUYRISM REPAIR; ENDARTERECTOMY RIGHT ILIAC, PROFUNDA  AND SUPERFICIAL FEMORAL ARTERY;  Surgeon: Gara Kroner, MD;  Location: [facility] OR;  Service: General Surgery;  Laterality: N/A;       Labs:   Glucose   Date Value Ref Range Status   03/17/2016 110 (H) 80 - 99 mg/dL Final       Nutrition-Related Meds include: Brovana, Pulmicort, Pepcid, Heparin, Miralax, Albuterol, Zofran, Compazine  IVF: Lactated Ringers @ 100 mL/hr     Ht: 76  Wt: 90.1 kg   BMI: 24.18 (Normal Range)  Admit Wt: 84.7 kg  Wt history:  Wt Readings from Last 3 Encounters:   03/17/16 90.1 kg (198 lb 10.2 oz)   03/08/16 83.9 kg (185 lb)   11/25/15 86.5 kg (190 lb 11.2 oz)       Diet Orders:   Procedures   . Diet Clear Liquid; No - patient to  receive automatic trays     Intakes: pt averaging 12.5% of meals charted since admission  Food Allergies: NKFA  Last BM: Not charted since admission (2 days)    Nutrition-Focused Physical Findings:  Loss of Body Fat:  Moderate, Triceps, Orbital  Loss of Muscle Mass:  Moderate, Clavicle, Temporal    Estimated daily needs: 2400 kcal (Mif[place]in x 1.3 AFx 1.1 IF), 101.6 gm Protein (1.2 gm/kg), 2540 mL fluid (30 mL/kg)    Assessment: Increased calorie and protein needs related to recent open AAA repair and right external iliac endarterectomy and evidence by current estimated needs are above standard.   Pt is POD #1 s/p open AAA repair and right external iliac. Endarterectomy.  Visited pt after team rounds this morning.  Pt was very sleepy in his bedside chair.  Family was at bedside and able to provide pt history.  Pt is a picky eater per family and eats minimally at home.  Family provided preferences including, strawberry milkshake, Most breakfast foods, PB &J sandwiches, meat and cheese sandwiches  and some soups.  Once diet is advanced will have diet clerk assist with meals selections to promote po.  Pt reported that his UBW was around 185 lbs, very close to his admission weight.  Family reported that the pt lost weight in the past and never regained.      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    Nutrition Intervention/Monitoring:  Monitor for diet advance  Add a Strawberry milkshake daily when diet advances  Have diet clerk assist with meal selections once diet advances

## 2016-03-17 NOTE — Progress Notes (Signed)
**THIS IS A MEDICAL STUDENT NOTE FOR EDUCATIONAL PURPOSES ONLY**        INPATIENT PROGRESS NOTE:  Name: Eduardo Khan.  MRN: [id number]  Date of Service: 03/17/2016    CC:  Open AAA repair    Subjective/Interval Hx:  - Not maintaining adequate O2, increased NC to 4L. Apneic spells while sleeping. RT at bedside, had success with 6L oxymask to 91%  - Epidural occluded, Dilaudid PCA started with IV for breakthrough pain  - Hypotensive episode in early AM responsive to fluid challenge  - Adequate UOP at >58mL/hr overnight    Objective:  Vital Sign Ranges for Last 24 Hours:  BP  Min: 75/57  Max: 156/77  Pulse  Min: 66  Max: 96  Resp  Min: 11  Max: 29  Temp  Min: 36.4 ?C (97.6 ?F)  Max: 37.9 ?C (100.2 ?F)  SpO2  Min: 86 %  Max: 97 %  Vitals:    03/17/16 1102   BP:    Pulse:    Resp:    Temp: 36.4 ?C (97.6 ?F)   SpO2:        Intake/Output Summary (Last 24 hours) at 03/17/16 1137  Last data filed at 03/17/16 0600   Gross per 24 hour   Intake           3934.2 ml   Output              628 ml   Net           3306.2 ml       Physical Exam:  Alert, appropriate, appears uncomfortable with coughing but otherwise in NAD  Chest: non-labored, equal breath sounds bilaterally  Heart: well perfused, regular rate, irregularly irregular rhythm, no JVD  Abdomen: soft, non distended, appropriately tender  Incisions/dressings d/c/i: midline abdomen, right inguinal, right femoral with no surrounding tenderness, warmth or erythema  G/U: N/A  Drain: N/A  Ext: feet slightly cool to touch but not mottled or cyanotic in appearance, R DP and PT pulses palpable by Doppler, L DP pulse palpable, 2+ radial pulses bilaterally    Labs:   Lab Results   Component Value Date    NA 137 03/17/2016    NA 139 05/16/2013    K 4.0 03/17/2016    K 3.1 (L) 05/16/2013    CL 104 03/17/2016    CL 104 05/16/2013    BUN 21 03/17/2016    BUN 20 05/16/2013    GLU 110 (H) 03/17/2016    GLU 110 (H) 05/16/2013     Lab Results   Component Value Date    HCT 36.0  (L) 03/17/2016    HCT 54.2 (H) 05/16/2013    MCV 98.6 03/17/2016    MCV 99.4 (H) 05/16/2013    RDW 13.0 03/17/2016    RDW 13.6 05/16/2013         Assessment and Plan:  Eduardo Scheib. is a 75 y.o. male now POD2 s/p open AAA repair and right external iliac endarterectomy.    I. Neuro  - Continue Dilaudid PCA with additional IV for breakthrough pain  - If PCA not adequately controlling pain today, will switch to morphine. Allergy to morphine reportedly was nausea.    II. Resp  - Requiring 6L oxymask while sleeping to maintain O2 >90%, per RT  - BS equal, suspect hypoxemia due to chronic lung disease with atelectasis and poor inspiratory effort from pain. Afebrile, and clinical suspicion for pneumonia  low.  - Home meds per RT (arfometerol, budesonide, ipratropium)  - Encourage robust IS/pulm hygiene    III. CV  - Monitor on tele  - Peripheral pulse checks daily with doppler, notify for any changes (current pulses by doppler are R PT and DP, L DP and faint PT intact)  - ASA 81mg  qd  - Half Metoprolol dosing to 6.25mg  q8h, hold for hypotension    IV. Renal  - Monitor UOP, goal >78mL/hr  - Keep Foley for now to monitor strict UOP given hypotension  - mIVF 165mL/hr  - Daily renal function panel  - Holding on diuresis given BP lability, will reeval tomorrow    V. FEN/GI  - Clear liquid diet, no advance for now due to risk for post-op ileus  - Miralax qd    VI. ID  - NTD    VI. Heme  - Continue heparin 5000 units q8h subQ  - CBC and coag panel qd    VII. MSK  - OOB as able, PT  - Do not sit with right hip flexed at >90 degrees      The attending of record for the above patient encounter is Dr. Tish Men, MD.        Eduardo Cheshire, MS-3  [place] Medical Student  General Surgery        **THIS IS A MEDICAL STUDENT NOTE FOR EDUCATIONAL PURPOSES ONLY**

## 2016-03-17 NOTE — Progress Notes (Signed)
0700: Received report from Leotis Shames and [employer] at bedside. Reviewed meds, labs, orders and notes. Resumed ccu standards of care.   0700-1900Precepting Evalee Mutton. New grad Charity fundraiser. Reviewed and agree with assessments, additional notes or changes made as necessary.     [facility] Laural Benes

## 2016-03-17 NOTE — Consults (Signed)
EPIDURAL PROGRESS NOTE    POD 2 status post open AAA    Dressing: Clean, Dry and Intact  Catheter site: Non-tender and No erythema  Pain Score:: 3/10  Side effects: No Nausea, No Pruritis and No Sedation  Infusion: ON HOLD infusing at 0 ml/hr   Anticoagulants : heparin 5000 TID    PHYSICAL EXAM  Vitals:    03/17/16 1200   BP: 98/67   Pulse: 84   Resp: 16   Temp:    SpO2: 92%     Mental: Confused  Motor: Able to bend knees    Assessment: Good Analgesia, pt switch to PCA, good pain cotrol.    Plan: D/C epidural, anesthesia sign off      Eduardo Khan  03/17/2016  1:14 PM

## 2016-03-17 NOTE — Progress Notes (Signed)
Physical Therapy Daily Note    Patient name:  Eduardo Khan.    Date:  03/17/2016    ASSESSMENT    The patient was in good spirits and cooperative. Bed mobility was mod A to the EOB,log rolling for comfort. His daughter and sisters were present and trained also. Transfers with FWW were min A from a high surface. Verbal cues to keep his knees extended during weight shifting. We are restricted by lines therefore ambulation is being held. He was transferred to a geri-chair so he can be kept away from 90 degrees of trunk flexion. Instructed on QS and AP's.    DME Needs: SPC,FWW      Discharge Recommendation: at current levels, short stay NH rehab.      Primary Diagnosis:   Patient Active Problem List   Diagnosis SNOMED CT(R)   . Community acquired bacterial pneumonia COMMUNITY ACQUIRED PNEUMONIA   . COPD (chronic obstructive pulmonary disease) (CMS/HCC) CHRONIC OBSTRUCTIVE LUNG DISEASE   . A-fib (CMS/HCC) ATRIAL FIBRILLATION   . History of spinal surgery H/O SPINAL SURGERY   . Lumbosacral radiculopathy LUMBOSACRAL RADICULOPATHY   . Onychomycosis due to dermatophyte ONYCHOMYCOSIS DUE TO DERMATOPHYTE   . Thrombocytopenia (CMS/HCC) PLATELET COUNT BELOW REFERENCE RANGE   . COPD with exacerbation (CMS/HCC) ACUTE EXACERBATION OF CHRONIC OBSTRUCTIVE AIRWAYS DISEASE   . Oral thrush CANDIDIASIS OF MOUTH   . S/P abdominal surgery, follow-up exam SURGICAL FOLLOW-UP       Surgery:  Procedure(s) (LRB):  AORTOBILILIAC ABDOMINAL ANEUYRISM REPAIR; ENDARTERECTOMY RIGHT ILIAC, PROFUNDA  AND SUPERFICIAL FEMORAL ARTERY (N/A)    Days Post Surgery:  2 Days Post-Op    PRECAUTIONS: log roll for comfort; avoid trunk flexion > 90 degrees.      SUBJECTIVE     Agreeable to PT    Pain:   Pain does somewhat limit patient's functional ability at present.    Pre Treatment Pain Level:  2/10   Pain Level during activity:  5/10  Post Treatment Pain Level:  3/10       OBJECTIVE      Appliances:  PCA,epidural,FC,IV,CCU monitors    Mental  Status/LOC/Orientation:  A&Ox4    Bed Mobility: mod A    Transfers: min A    Gait:   Eduardo Khan. walked 0 feet    Gait Issues/Limitations: inhibited by CCU lines.    Therapeutic Exercises/ROM:  QS,AP's    Vitals: No significant changes in vital signs noted.  Sitting EOB:  BP =  155/88 HR =  92 bpm  Sp02 =  94% on 4 liter(s) oxygen  Sitting post exertion:    BP =  144/68 HR =  89 bpm  Sp02 =  95% on 4 liter(s) oxygen     Safety:  Eduardo Khan. left in room in geri-chair with call light /phone within reach.  Exit alarm on.  Instructed in benefits of increased out of bed time.  Encouraged patient to be out of bed for 2 hours.       PLAN         Frequency: 1x/day (M-F)   1x/day (Saturday or Sunday)    Patient/Caregiver GOALS reviewed and integrated with rehab treatment plan:   Multidisciplinary Problems (Active)        Problem: IP General Goal List - 1    Goal Priority Disciplines Outcome   Bed Mobility     PT    Description:  Pt. to perform bed mobility independently.  Transfers     PT    Description:  Patient to perform all functional transfers independently.    Ambulation - Levels     PT    Description:  Patient to ambulate independently x 350 feet with appropriate assistive device.                      Eduardo Khan. will remain on the Physical Therapy schedule until goals are met, or there has been a change in the plan of care.      Therapist/License #: Earlyne Iba, PT    Start Time:  28 /Stop Time:  1610    08 Therapeutic Exercise [phone])   45 Therapeutic Activities [phone])

## 2016-03-17 NOTE — Progress Notes (Addendum)
0700 - received report from [employer]. PCA setting reviewed, pedal pulses dopplered, epidural off. Pt resting comfortably, VSS, chart reviewed, and CCU SOC continued.   1200 - transfer orders confirmed with Dr. Frederik Schmidt, [facility] standards followed  1300 - pt strictly refused morphine in PCA due to past post surgical experiences of severe dry heaves, nausea and vomiting. Dilaudid was restarted and Dr. Frederik Schmidt notified.   1730 - epidural removed per order, pt tolerated well.   1900 - Report given to night Rn, care relinquished.   Santina Evans May, RN

## 2016-03-17 NOTE — Progress Notes (Addendum)
1900: Report from Elmer Picker, Charity fundraiser. Kardex, notes, labs and orders reviewed, SOC cont.    0700: Pulses dopplerable and unchanged throughout shift. Pt had more than 1 L u/o. VSS. Bedside report to [place], Charity fundraiser. Care relinquished.

## 2016-03-18 LAB — CBC WITH AUTO DIFFERENTIAL
Basophils %: 2 % (ref 0–2)
Basophils, Absolute: 0.1 10*3/ÂµL (ref 0.0–0.2)
Eosinophils %: 1 % (ref 0–7)
Eosinophils, Absolute: 0.1 10*3/ÂµL (ref 0.0–0.7)
HCT: 32.3 % — ABNORMAL LOW (ref 42.0–54.0)
Hemoglobin: 11.3 g/dL — ABNORMAL LOW (ref 12.0–18.0)
Lymphocytes %: 14 % — ABNORMAL LOW (ref 25–45)
Lymphocytes, Absolute: 0.9 10*3/ÂµL — ABNORMAL LOW (ref 1.1–4.3)
MCH: 34.8 pg — ABNORMAL HIGH (ref 27.0–34.0)
MCHC: 35.1 g/dL (ref 32.0–36.0)
MCV: 99 fL (ref 81.0–99.0)
MPV: 9 fL (ref 7.4–10.4)
Monocytes %: 7 % (ref 0–12)
Monocytes, Absolute: 0.5 10*3/ÂµL (ref 0.0–1.2)
Neutrophils %: 76 % — ABNORMAL HIGH (ref 35–70)
Neutrophils, Absolute: 4.9 10*3/ÂµL (ref 1.6–7.3)
Platelet Count: 70 10*3/ÂµL — ABNORMAL LOW (ref 150–400)
RBC: 3.26 10*6/ÂµL — ABNORMAL LOW (ref 4.70–6.10)
RDW: 12.8 % (ref 11.5–14.5)
WBC: 6.4 10*3/ÂµL (ref 4.8–10.8)

## 2016-03-18 LAB — BASIC METABOLIC PANEL
Anion Gap: 4 mmol/L (ref 3.0–11.0)
BUN: 14 mg/dL (ref 6–23)
CO2 - Carbon Dioxide: 28 mmol/L (ref 21.0–31.0)
Calcium: 7.7 mg/dL — ABNORMAL LOW (ref 8.6–10.3)
Chloride: 103 mmol/L (ref 98–111)
Creatinine: 0.6 mg/dL — ABNORMAL LOW (ref 0.65–1.30)
GFR Estimate: >60 mL/min/{1.73_m2} (ref >=60)
Glucose: 98 mg/dL (ref 80–99)
Potassium: 3.5 mmol/L (ref 3.5–5.1)
Sodium: 135 mmol/L (ref 135–143)

## 2016-03-18 LAB — PROTIME-INR
INR: 1.2 (ref 0.9–1.2)
Protime: 13.4 Seconds (ref 9.9–13.4)

## 2016-03-18 LAB — MAGNESIUM: Magnesium: 1.7 mg/dL (ref 1.6–2.4)

## 2016-03-18 MED ORDER — furosemide (LASIX) injection 20 mg
10 | Freq: Once | INTRAMUSCULAR | Status: AC
Start: 2016-03-18 — End: 2016-03-18
  Administered 2016-03-18: 15:00:00 10 mg via INTRAVENOUS

## 2016-03-18 MED ORDER — tamsulosin (FLOMAX) 24 hr capsule 0.4 mg
0.4 | Freq: Every day | ORAL | Status: DC
Start: 2016-03-18 — End: 2016-03-20
  Administered 2016-03-18 – 2016-03-20 (×3): 0.4 mg via ORAL

## 2016-03-18 MED ORDER — potassium chloride IVPB 20 mEq/250 mL NS Premix
20 | Freq: Once | INTRAVENOUS | Status: AC
Start: 2016-03-18 — End: 2016-03-18
  Administered 2016-03-18: 21:00:00 20 meq via INTRAVENOUS
  Administered 2016-03-19: 01:00:00 20250 meq via INTRAVENOUS

## 2016-03-18 MED ORDER — atorvastatin (LIPITOR) tablet 20 mg
10 | Freq: Every evening | ORAL | Status: DC
Start: 2016-03-18 — End: 2016-03-20
  Administered 2016-03-19 – 2016-03-20 (×2): 10 mg via ORAL

## 2016-03-18 MED ORDER — oxyCODONE (ROXICODONE) tablet 5-10 mg
5 | ORAL | Status: DC | PRN
Start: 2016-03-18 — End: 2016-03-20
  Administered 2016-03-18 – 2016-03-20 (×8): 5 mg via ORAL

## 2016-03-18 MED ORDER — warfarin (COUMADIN) tablet 5 mg
5 | ORAL | Status: DC
Start: 2016-03-18 — End: 2016-03-20
  Administered 2016-03-20: 02:00:00 5 mg via ORAL

## 2016-03-18 MED ORDER — HYDROmorphone (DILAUDID) 1 mg/mL (1 mL) syringe 0.2-0.6 mg
1 | INTRAVENOUS | Status: DC | PRN
Start: 2016-03-18 — End: 2016-03-20

## 2016-03-18 MED ORDER — potassium chloride IVPB 20 mEq/250 mL NS Premix
20 | Freq: Once | INTRAVENOUS | Status: AC
Start: 2016-03-18 — End: 2016-03-18
  Administered 2016-03-18: 15:00:00 20 meq via INTRAVENOUS
  Administered 2016-03-18: 18:00:00 20250 meq via INTRAVENOUS

## 2016-03-18 MED ORDER — potassium chloride IVPB 20 mEq/250 mL NS Premix
20 | Freq: Once | INTRAVENOUS | Status: AC
Start: 2016-03-18 — End: 2016-03-18
  Administered 2016-03-18: 21:00:00 20250 meq via INTRAVENOUS
  Administered 2016-03-18: 18:00:00 20 meq via INTRAVENOUS

## 2016-03-18 MED ORDER — magnesium sulfate in water 4 gram/50 mL (8 %) IVPB 4 g
4 | Freq: Once | INTRAVENOUS | Status: AC
Start: 2016-03-18 — End: 2016-03-18
  Administered 2016-03-18: 19:00:00 4508 g via INTRAVENOUS
  Administered 2016-03-18: 16:00:00 4 g via INTRAVENOUS

## 2016-03-18 MED ORDER — warfarin (COUMADIN) tablet 2.5 mg
2.5 | ORAL | Status: DC
Start: 2016-03-18 — End: 2016-03-20
  Administered 2016-03-19: 01:00:00 2.5 mg via ORAL

## 2016-03-18 MED FILL — ASPIRIN 81 MG CHEWABLE TABLET: 81 mg | ORAL | Qty: 1

## 2016-03-18 MED FILL — BROVANA 15 MCG/2 ML SOLUTION FOR NEBULIZATION: 15 mcg/2 mL | RESPIRATORY_TRACT | Qty: 2

## 2016-03-18 MED FILL — FAMOTIDINE 20 MG TABLET: 20 mg | ORAL | Qty: 1

## 2016-03-18 MED FILL — ACETAMINOPHEN 325 MG TABLET: 325 mg | ORAL | Qty: 2

## 2016-03-18 MED FILL — HEPARIN, PORCINE (PF) 5,000 UNIT/0.5 ML SYRINGE: 5000 unit/0.5 mL | INTRAMUSCULAR | Qty: 0.5

## 2016-03-18 MED FILL — FUROSEMIDE 10 MG/ML INJECTION SOLUTION: 10 mg/mL | INTRAMUSCULAR | Qty: 2

## 2016-03-18 MED FILL — BUDESONIDE 0.5 MG/2 ML SUSPENSION FOR NEBULIZATION: 0.5 | RESPIRATORY_TRACT | Qty: 2

## 2016-03-18 MED FILL — MIRALAX 17 GRAM ORAL POWDER PACKET: 17 g | ORAL | Qty: 1

## 2016-03-18 MED FILL — METOPROLOL TARTRATE 25 MG TABLET: 25 mg | ORAL | Qty: 1

## 2016-03-18 MED FILL — OXYCODONE 5 MG TABLET: 5 mg | ORAL | Qty: 1

## 2016-03-18 MED FILL — POTASSIUM CHLORIDE 20 MEQ/250 ML (80 MEQ/L) IN 0.9 % SOD.CHLORIDE IV: 20 mEq/250 mL | INTRAVENOUS | Qty: 250

## 2016-03-18 MED FILL — TAMSULOSIN 0.4 MG CAPSULE: 0.4 mg | ORAL | Qty: 1

## 2016-03-18 MED FILL — MAGNESIUM SULFATE 4 GRAM/50 ML (8 %) IN WATER INTRAVENOUS PIGGYBACK: 4 gram/50 mL (8 %) | INTRAVENOUS | Qty: 50

## 2016-03-18 MED FILL — IPRATROPIUM BROMIDE 0.02 % SOLUTION FOR INHALATION: 0.02 % | RESPIRATORY_TRACT | Qty: 2.5

## 2016-03-18 MED FILL — LIDOCAINE 5 % TOPICAL PATCH: 5 % | TOPICAL | Qty: 2

## 2016-03-18 MED FILL — LACTATED RINGERS INTRAVENOUS SOLUTION: INTRAVENOUS | Qty: 1000

## 2016-03-18 MED FILL — ONDANSETRON HCL (PF) 4 MG/2 ML INJECTION SOLUTION: 4 | INTRAMUSCULAR | Qty: 2

## 2016-03-18 MED FILL — NICOTINE 21 MG/24 HR DAILY TRANSDERMAL PATCH: 21 mg/24 hr | TRANSDERMAL | Qty: 1

## 2016-03-18 NOTE — Progress Notes (Addendum)
44 - received update from [place], Charity fundraiser. Pt moved to chair, VSS, chart reviewed, and CCU SOC continued.   1030 - PCA d/c per order, pt started on oral oxycodone PRN. Foley removed without complication per order.   1900 - Report given to pm Rn, care relinquished.     Santina Evans May, RN

## 2016-03-18 NOTE — Progress Notes (Signed)
0700-1900Precepting Evalee Mutton. New grad Charity fundraiser. Reviewed and agree with assessments, additional notes or changes made as necessary.   [facility] Laural Benes

## 2016-03-18 NOTE — Progress Notes (Signed)
INPATIENT PROGRESS NOTE:  Name: Eduardo Khan.  MRN: [id number]  Date of Service: 03/18/2016    CC:  Abdominal aortic aneurysm    Subjective/Interval Hx:  NAE overnight  AF, vss, UOP appropriate  Denies nausea or vomiting, tolerating clears, passing flatus      Objective:  Vital Sign Ranges for Last 24 Hours:  BP  Min: 93/56  Max: 132/64  Pulse  Min: 80  Max: 96  Resp  Min: 12  Max: 27  Temp  Min: 36.7 ?C (98 ?F)  Max: 38.1 ?C (100.6 ?F)  SpO2  Min: 82 %  Max: 99 %  Vitals:    03/18/16 1101   BP:    Pulse:    Resp:    Temp: 36.8 ?C (98.2 ?F)   SpO2:        Intake/Output Summary (Last 24 hours) at 03/18/16 1400  Last data filed at 03/18/16 1300   Gross per 24 hour   Intake           2282.8 ml   Output             3470 ml   Net          -1187.2 ml       Physical Exam:  A&Ox3, NAD  Chest: non-labored  Heart: well perfused  Abdomen: soft, mildly distended, minimally tender  Incisions/dressings d/c/i: y  Ext: wwp.  +DP/PT bilaterally    Labs:   Lab Results   Component Value Date    NA 135 03/18/2016    NA 139 05/16/2013    K 3.5 03/18/2016    K 3.1 (L) 05/16/2013    CL 103 03/18/2016    CL 104 05/16/2013    BUN 14 03/18/2016    BUN 20 05/16/2013    GLU 98 03/18/2016    GLU 110 (H) 05/16/2013     Lab Results   Component Value Date    HCT 32.3 (L) 03/18/2016    HCT 54.2 (H) 05/16/2013    MCV 99.0 03/18/2016    MCV 99.4 (H) 05/16/2013    RDW 12.8 03/18/2016    RDW 13.6 05/16/2013         Assessment and Plan:  Eduardo Khan. is a 75 y.o. male POD3 s/p AAA repair with bifurcated graft to L CIA, R EIA with ligation of R hypogastric, and endarterectomy of R EIA, CFA, SFA, and profunda and thromboembolectomy   -tylenol, oxycodone  -pulmonary hygiene/IS, home inhalers  -continue metoprolol, diuresis challenge today  -aspirin, statin  -regular diet  -dc foley  -heparin prophylaxis, SCDs  -pt, oob to chair today    The attending of record for the above patient encounter is Dr. Tish Men.    Waldron Labs,  MD  General Surgery, R4

## 2016-03-18 NOTE — Progress Notes (Signed)
Physical Therapy Daily Note    Patient name:  Eduardo Khan.    Date:  03/18/2016    ASSESSMENT    The patient was min A with bed mobility and transfers with FWW. Ambulation is precluded due to CCU lines and monitors.  He is tolerating chair positioning about an hour per session.  He far below his baseline functional level.      DME Has: SPC,FWW    Discharge Recommendation: I recommend NH rehab but he declines; next option is HHPT and assistance from family. They have observed our treatments and feel they can manage.      Primary Diagnosis:   Patient Active Problem List   Diagnosis SNOMED CT(R)   . Community acquired bacterial pneumonia COMMUNITY ACQUIRED PNEUMONIA   . COPD (chronic obstructive pulmonary disease) (CMS/HCC) CHRONIC OBSTRUCTIVE LUNG DISEASE   . A-fib (CMS/HCC) ATRIAL FIBRILLATION   . History of spinal surgery H/O SPINAL SURGERY   . Lumbosacral radiculopathy LUMBOSACRAL RADICULOPATHY   . Onychomycosis due to dermatophyte ONYCHOMYCOSIS DUE TO DERMATOPHYTE   . Thrombocytopenia (CMS/HCC) PLATELET COUNT BELOW REFERENCE RANGE   . COPD with exacerbation (CMS/HCC) ACUTE EXACERBATION OF CHRONIC OBSTRUCTIVE AIRWAYS DISEASE   . Oral thrush CANDIDIASIS OF MOUTH   . S/P abdominal surgery, follow-up exam SURGICAL FOLLOW-UP       Surgery:  Procedure(s) (LRB):  AORTOBILILIAC ABDOMINAL ANEUYRISM REPAIR; ENDARTERECTOMY RIGHT ILIAC, PROFUNDA  AND SUPERFICIAL FEMORAL ARTERY (N/A)    Days Post Surgery:  3 Days Post-Op    PRECAUTIONS: log roll for comfort.      SUBJECTIVE     Agreeable to PT    Pain:   Pain does somewhat not limit patient's functional ability at present.    Pre Treatment Pain Level:  2/10   Pain Level during activity:  5/10  Post Treatment Pain Level:  3/10       OBJECTIVE      Appliances:  CCU lines and monitors.    Mental Status/LOC/Orientation:  A&Ox3    Bed Mobility: min A    Transfers: min A (high surfaces)    Gait:   Eduardo Khan. walked 0 feet    Gait Issues/Limitations: CCU lines  and monitors preclude ambulation.    Therapeutic Exercises/ROM:  AP,QS,GS    Vitals: No significant changes in vital signs noted.    BP =  102/60 HR =  80 bpm  Sp02 =  99% on 4 liter(s) oxygen    Safety:  Eduardo Khan. left in room in bed with call light /phone within reach.  Exit alarm on.  Instructed in benefits of increased out of bed time.  Encouraged patient to be out of bed for 2 hours.       PLAN         Frequency: 1x/day (M-F)   1x/day (Saturday or Sunday)    Patient/Caregiver GOALS reviewed and integrated with rehab treatment plan:   Multidisciplinary Problems (Active)        Problem: IP General Goal List - 1    Goal Priority Disciplines Outcome   Bed Mobility     PT    Description:  Pt. to perform bed mobility independently.    Transfers     PT    Description:  Patient to perform all functional transfers independently.    Ambulation - Levels     PT    Description:  Patient to ambulate independently x 350 feet with appropriate assistive device.  Eduardo Khan. will remain on the Physical Therapy schedule until goals are met, or there has been a change in the plan of care.      Therapist/License #: Earlyne Iba, PT    Start Time:  34 /Stop Time:  0908  Start Time:  1053 /Stop Time:  1103     38 Therapeutic Activities [phone])

## 2016-03-18 NOTE — Progress Notes (Signed)
PHARMACIST PROGRESS NOTE     The following pharmacy services were provided: warfarin dosing    SUBJECTIVE / OBJECTIVE     Eduardo HARRY Zuccaro JR. is a 75 y.o. Male admitted on 03/15/2016.    I have reviewed available relevant labs, vitals and flowsheets today.  The following are especially relevant:    INR 1.2, platelets 70 (03/18/16)    ASSESSMENT       Warfarin Dosing  Warfarin indication:  A-Fib  CHA2DS2-VASc Score:  3  Goal INR:  2 - 3  The current INR is subtherapeutic.    Items that may affect INR:  Drug-disease interaction(s) and Additional anticoagulant(s)  The following disease states may affect warfarin sensitivity:  Thrombocytopenia  The patient also has the following additional anticoagulant(s) ordered:   Aspirin, heparin    Home warfarin regimen:  2.5 mg Sunday, Tuesday, Thursday, Saturday.  5 mg Monday, Wednesday, Friday.    PLAN       Warfarin Dosing  Restart patient's home warfarin regimen.  Daily INR, pharmacy to monitor.      Patient also receiving heparin subcutaneously and aspirin by mouth, platelets 70.  Follow along, recommend stopping heparin when INR therapeutic x 2 days.           Signed by: Ashley Akin, RPH

## 2016-03-19 LAB — PROTIME-INR
INR: 1 (ref 0.9–1.2)
Protime: 12 Seconds (ref 9.9–13.4)

## 2016-03-19 LAB — PLATELET COUNT: Platelet Count: 77 10*3/ÂµL — ABNORMAL LOW (ref 150–400)

## 2016-03-19 MED ORDER — bisacodyl (DULCOLAX) suppository 10 mg
10 | Freq: Every day | RECTAL | Status: DC | PRN
Start: 2016-03-19 — End: 2016-03-20
  Administered 2016-03-20: 02:00:00 10 mg via RECTAL

## 2016-03-19 MED ORDER — enoxaparin (LOVENOX) syringe 40 mg
40 | Freq: Every day | SUBCUTANEOUS | Status: DC
Start: 2016-03-19 — End: 2016-03-20
  Administered 2016-03-20: 02:00:00 40 mg via SUBCUTANEOUS

## 2016-03-19 MED ORDER — furosemide (LASIX) injection 20 mg
10 | Freq: Once | INTRAMUSCULAR | Status: AC
Start: 2016-03-19 — End: 2016-03-19
  Administered 2016-03-19: 16:00:00 10 mg via INTRAVENOUS

## 2016-03-19 MED ORDER — polyethylene glycol (MIRALAX) packet 17 g
17 | Freq: Two times a day (BID) | ORAL | Status: DC
Start: 2016-03-19 — End: 2016-03-20
  Administered 2016-03-19 – 2016-03-20 (×3): 17 g via ORAL

## 2016-03-19 MED ORDER — bisacodyl (DULCOLAX) suppository 10 mg
10 | Freq: Once | RECTAL | Status: DC
Start: 2016-03-19 — End: 2016-03-20

## 2016-03-19 MED ORDER — metoprolol tartrate (LOPRESSOR) tablet 6.25 mg
25 | Freq: Once | ORAL | Status: AC
Start: 2016-03-19 — End: 2016-03-19
  Administered 2016-03-19: 16:00:00 25 mg via ORAL

## 2016-03-19 MED ORDER — metoprolol tartrate (LOPRESSOR) tablet 12.5 mg
25 | Freq: Three times a day (TID) | ORAL | Status: DC
Start: 2016-03-19 — End: 2016-03-20
  Administered 2016-03-20: 14:00:00 25 mg via ORAL

## 2016-03-19 MED FILL — BROVANA 15 MCG/2 ML SOLUTION FOR NEBULIZATION: 15 mcg/2 mL | RESPIRATORY_TRACT | Qty: 2

## 2016-03-19 MED FILL — LIDOCAINE 5 % TOPICAL PATCH: 5 % | TOPICAL | Qty: 2

## 2016-03-19 MED FILL — FAMOTIDINE 20 MG TABLET: 20 mg | ORAL | Qty: 1

## 2016-03-19 MED FILL — HEPARIN, PORCINE (PF) 5,000 UNIT/0.5 ML SYRINGE: 5000 unit/0.5 mL | INTRAMUSCULAR | Qty: 0.5

## 2016-03-19 MED FILL — BISAC-EVAC 10 MG RECTAL SUPPOSITORY: 10 mg | RECTAL | Qty: 1

## 2016-03-19 MED FILL — COUMADIN 2.5 MG TABLET: 2.5 mg | ORAL | Qty: 1

## 2016-03-19 MED FILL — ASPIRIN 81 MG CHEWABLE TABLET: 81 mg | ORAL | Qty: 1

## 2016-03-19 MED FILL — OXYCODONE 5 MG TABLET: 5 mg | ORAL | Qty: 2

## 2016-03-19 MED FILL — METOPROLOL TARTRATE 25 MG TABLET: 25 mg | ORAL | Qty: 1

## 2016-03-19 MED FILL — NICOTINE 21 MG/24 HR DAILY TRANSDERMAL PATCH: 21 mg/24 hr | TRANSDERMAL | Qty: 1

## 2016-03-19 MED FILL — BUDESONIDE 0.5 MG/2 ML SUSPENSION FOR NEBULIZATION: 0.5 | RESPIRATORY_TRACT | Qty: 2

## 2016-03-19 MED FILL — IPRATROPIUM BROMIDE 0.02 % SOLUTION FOR INHALATION: 0.02 % | RESPIRATORY_TRACT | Qty: 2.5

## 2016-03-19 MED FILL — OXYCODONE 5 MG TABLET: 5 mg | ORAL | Qty: 1

## 2016-03-19 MED FILL — MIRALAX 17 GRAM ORAL POWDER PACKET: 17 g | ORAL | Qty: 1

## 2016-03-19 MED FILL — FUROSEMIDE 10 MG/ML INJECTION SOLUTION: 10 mg/mL | INTRAMUSCULAR | Qty: 2

## 2016-03-19 MED FILL — TAMSULOSIN 0.4 MG CAPSULE: 0.4 mg | ORAL | Qty: 1

## 2016-03-19 MED FILL — ATORVASTATIN 10 MG TABLET: 10 mg | ORAL | Qty: 2

## 2016-03-19 NOTE — Discharge Planning (AHS/AVS) (Addendum)
AM rounds with team.  I was informed by bedside RN that family has decided not to go with [facility] but selected [facility] instead.  CMS [place] will notify both agencies of the changes. Angie Fava RN Case Manager [phone] 03/19/2016 11:10 AM      DCP/CMS please ensure we have the right address where pt will be going.

## 2016-03-19 NOTE — Progress Notes (Addendum)
0700 - Received report from [employer], pt VSS and pt has no signs of pain at the time of transition of care.  [employer]. Labs, medications, orders, kardex reviewed. Side rails up, bed in low/locked position, nurse call light/bedside table within reach, cardiac monitor on/alarms reviewed.     1130 - Spoke with MD Frederik Schmidt, about order for Lovenox.  MD aware of low platelet count, order to give the medication and dressing only need to be applied to the R groin.      1910 - Report given to RN Nadine Counts, pt VSS and pt has minimal c/o pain at the time of transfer of care.  RN made aware of the pt's HPI and progress during this shift including ambulation and BM at the end of the shift.No further questions once report is completed at pt's   bedside; pt expressed thanks for the care received at [facility] CCU.

## 2016-03-19 NOTE — Progress Notes (Signed)
INPATIENT PROGRESS NOTE:  Name: Eduardo Khan.  MRN: [id number]  Date of Service: 03/19/2016    CC:  Abdominal aortic aneurysm    Subjective/Interval Hx:  NAE overnight  AF, vss, UOP appropriate  Pain well controlled  Tolerating po      Objective:  Vital Sign Ranges for Last 24 Hours:  BP  Min: 102/60  Max: 157/84  Pulse  Min: 80  Max: 98  Resp  Min: 13  Max: 27  Temp  Min: 36.7 ?C (98 ?F)  Max: 37.1 ?C (98.7 ?F)  SpO2  Min: 91 %  Max: 99 %  Vitals:    03/19/16 0712   BP:    Pulse:    Resp:    Temp:    SpO2: 93%       Intake/Output Summary (Last 24 hours) at 03/19/16 8416  Last data filed at 03/19/16 0530   Gross per 24 hour   Intake           1563.8 ml   Output             2680 ml   Net          -1116.2 ml       Physical Exam:  A&Ox3, NAD  Chest: non-labored  Heart: well perfused  Abdomen: soft, mildly distended, minimally tender  Incisions/dressings d/c/i: y  Ext: wwp.  +DP/PT bilaterally    Labs:   Lab Results   Component Value Date    NA 135 03/18/2016    NA 139 05/16/2013    K 3.5 03/18/2016    K 3.1 (L) 05/16/2013    CL 103 03/18/2016    CL 104 05/16/2013    BUN 14 03/18/2016    BUN 20 05/16/2013    GLU 98 03/18/2016    GLU 110 (H) 05/16/2013     Lab Results   Component Value Date    HCT 32.3 (L) 03/18/2016    HCT 54.2 (H) 05/16/2013    MCV 99.0 03/18/2016    MCV 99.4 (H) 05/16/2013    RDW 12.8 03/18/2016    RDW 13.6 05/16/2013         Assessment and Plan:  Eduardo Khan. is a 75 y.o. male POD4 s/p AAA repair with bifurcated graft to L CIA, R EIA with ligation of R hypogastric, and endarterectomy of R EIA, CFA, SFA, and profunda and thromboembolectomy   -tylenol, oxycodone  -pulmonary hygiene/IS, home inhalers  -continue metoprolol, diuresis challenge today  -aspirin, statin  -regular diet  -will transition to prophylactic lovenox, plan to dc on prophylactic lovenox bridge while resuming home coumadin, SCDs  -pt, ambulate TID today    The attending of record for the above patient encounter is  Dr. Tish Men.    Waldron Labs, MD  General Surgery, R4

## 2016-03-19 NOTE — Progress Notes (Signed)
Physical Therapy Daily Note    Patient name:  Eduardo Khan.    Date:  03/19/2016    ASSESSMENT    Excellent on-foot tolerance for his first attempt of ambulation with FWW: 240' and CGA.    DME Has: SPC,FWW    Discharge Recommendation: go home to his daughters house and HHPT.      Primary Diagnosis:   Patient Active Problem List   Diagnosis SNOMED CT(R)   . Community acquired bacterial pneumonia COMMUNITY ACQUIRED PNEUMONIA   . COPD (chronic obstructive pulmonary disease) (CMS/HCC) CHRONIC OBSTRUCTIVE LUNG DISEASE   . A-fib (CMS/HCC) ATRIAL FIBRILLATION   . History of spinal surgery H/O SPINAL SURGERY   . Lumbosacral radiculopathy LUMBOSACRAL RADICULOPATHY   . Onychomycosis due to dermatophyte ONYCHOMYCOSIS DUE TO DERMATOPHYTE   . Thrombocytopenia (CMS/HCC) PLATELET COUNT BELOW REFERENCE RANGE   . COPD with exacerbation (CMS/HCC) ACUTE EXACERBATION OF CHRONIC OBSTRUCTIVE AIRWAYS DISEASE   . Oral thrush CANDIDIASIS OF MOUTH   . S/P abdominal surgery, follow-up exam SURGICAL FOLLOW-UP       Surgery:  Procedure(s) (LRB):  AORTOBILILIAC ABDOMINAL ANEUYRISM REPAIR; ENDARTERECTOMY RIGHT ILIAC, PROFUNDA  AND SUPERFICIAL FEMORAL ARTERY (N/A)    Days Post Surgery:  4 Days Post-Op    PRECAUTIONS: log roll for comfort.      SUBJECTIVE     Agreeable to PT    Pain:   Pain does some what limit patient's functional ability at present.    Pre Treatment Pain Level:  2/10   Pain Level during activity:  4/10  Post Treatment Pain Level:  2/10       OBJECTIVE      Appliances:  We were able to take down some of the lines temporarily to allow gait with FWW    Mental Status/LOC/Orientation:  A&Ox4    Bed Mobility: CGA    Transfers: CGA    Gait:   [name] walked 240 feet with FWW and CG assist.    Gait Issues/Limitations: excellent tolerance on 2 lpm.    Vitals: No significant changes in vital signs noted.    Safety:  [name] left in room in chair with call light /phone within reach.  Exit alarm  on.  Instructed in benefits of increased out of bed time.  Encouraged patient to be out of bed for 2 hours.       PLAN         Frequency: 1x/day (M-F)   1x/day (Saturday or Sunday)    Patient/Caregiver GOALS reviewed and integrated with rehab treatment plan:   Multidisciplinary Problems (Active)        Problem: IP General Goal List - 1    Goal Priority Disciplines Outcome   Bed Mobility     PT    Description:  Pt. to perform bed mobility independently.    Transfers     PT    Description:  Patient to perform all functional transfers independently.    Ambulation - Levels     PT    Description:  Patient to ambulate independently x 350 feet with appropriate assistive device.                      Ayaan Shutes. will remain on the Physical Therapy schedule until goals are met, or there has been a change in the plan of care.      Therapist/License #: Earlyne Iba, PT    Start Time:  0940 /Stop Time:  1010    15 Therapeutic Activities (97530)  15 Gait Training (16109)

## 2016-03-19 NOTE — Progress Notes (Signed)
Nutrition Follow-Up --     Recommend:  1. Consider an appetite stimulant   2.  Please start bowel medications    Pt is at Moderate-High Nutrition Risk    Follow up Nutrition Assessment follows below:     LOS: 4 days  Active Hospital Problems    Diagnosis SNOMED CT(R) Date Noted   . S/P abdominal surgery, follow-up exam SURGICAL FOLLOW-UP        Labs:   Glucose   Date Value Ref Range Status   03/18/2016 98 80 - 99 mg/dL Final     Albumin   Date Value Ref Range Status   11/22/2015 3.6 3.5 - 5.0 g/dL Final   19/14/7829 4.1 3.5 - 5.0 g/dL Final     Creatinine   Date Value Ref Range Status   03/18/2016 0.60 (L) 0.65 - 1.30 mg/dL Final   56/21/3086 5.78 0.65 - 1.30 mg/dL Final   46/96/2952 8.41 0.65 - 1.30 mg/dL Final     BUN   Date Value Ref Range Status   03/18/2016 14 6 - 23 mg/dL Final   32/44/0102 21 6 - 23 mg/dL Final   72/53/6644 20 6 - 23 mg/dL Final     Potassium   Date Value Ref Range Status   03/18/2016 3.5 3.5 - 5.1 mmol/L Final   03/17/2016 4.0 3.5 - 5.1 mmol/L Final   03/16/2016 3.9 3.5 - 5.1 mmol/L Final     Sodium   Date Value Ref Range Status   03/18/2016 135 135 - 143 mmol/L Final   03/17/2016 137 135 - 143 mmol/L Final   03/16/2016 140 135 - 143 mmol/L Final     GFR Estimate   Date Value Ref Range Status   03/18/2016 >60 >=60 mL/min/1.17m*2 Final   03/17/2016 >60 >=60 mL/min/1.37m*2 Final   03/16/2016 >60 >=60 mL/min/1.1m*2 Final     Lab Results   Component Value Date    CALCIUM 7.7 (L) 03/18/2016       Weight today: 86.2 kg  (admit wt: 84.7 kg)    Diet Orders:   Procedures   . Diet Regular; No - patient to receive automatic trays     Intakes: averaging < 25% of meals charted  Supplement: High calorie strawberry milkshake with lunch  Last BM: Not charted since admission    Nutrition-Related Meds include: Brovana, Lipitor, Dulcolax, Pulmicort, Pepcid, Lasix, Miralax, Coumadin, Albuterol, Zofran, Compazine  IVF: No continuous fluids at this time     Nutrition-Focused Physical Findings:  Per I&O pt  appears to be +6L since admission and continues to diurese    Estimated daily needs:    2400 kcal (Mifflin x 1.3 AFx 1.1 IF), 101.6 gm Protein (1.2 gm/kg), 2540 mL fluid (30 mL/kg)    Assessment: Increased calorie and protein needs continue. PT is POD #3 s/p open AAA repair and right external iliac, Endarterectomy. Pt's diet has advanced to a regular but his intake remains low.  Spoke at team round to RN about possibly starting pt on an appetite stimulant.  Family reports that this is a chronic issue at home for the pt.   Pt has been up working with PT.     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

## 2016-03-19 NOTE — Progress Notes (Signed)
PHARMACIST PROGRESS NOTE     The following pharmacy services were provided: warfarin dosing    SUBJECTIVE / OBJECTIVE     Eduardo HARRY Sylva JR. is a 75 y.o. Male admitted on 03/15/2016.    I have reviewed available relevant labs, vitals and flowsheets today.  The following are especially relevant:    INR 1.0, platelets 77 (03/19/16)  INR 1.2, platelets 70 (03/18/16)    ASSESSMENT       Warfarin Dosing  Warfarin indication:  A-Fib  CHA2DS2-VASc Score:  3  Goal INR:  2 - 3  The current INR is subtherapeutic.    Items that may affect INR:  Drug-disease interaction(s) and Additional anticoagulant(s)  The following disease states may affect warfarin sensitivity:  Thrombocytopenia  The patient also has the following additional anticoagulant(s) ordered:   Aspirin, enoxaparin    Home warfarin regimen:  2.5 mg Sunday, Tuesday, Thursday, Saturday.  5 mg Monday, Wednesday, Friday.    PLAN       Warfarin Dosing  Continue warfarin per patient's home regimen (2.5 mg Sunday, Tuesday, Thursday, Saturday, and 5 mg Monday, Wednesday, Friday).  INR daily, pharmacy to monitor.           Signed by: Ashley Akin, RPH

## 2016-03-19 NOTE — Discharge Planning (AHS/AVS) (Signed)
Per Discharge Planner Angie Fava, RN I sent a referral to [facility] for approval. Patient anticipated to discharge this weekend.   I received a call from [place] at [facility] questioning when the patient was going to discharge. I informed her the patient has chosen to go with a different Home Health agency. She voiced understanding and said she would cancel the referral.   Eligha Bridegroom, Case Management Specialist  03/19/2016 11:35 AM

## 2016-03-19 NOTE — Progress Notes (Signed)
03/18/2016 1905 Bedside report received from [facility] and [place] RN's. CCU standards continued including bed in low position with alarm on, call light and bedside table with urinal within reach. Bilat LE pulses doppled with x3 RN's present.  03/19/2016 1905 Bedside report given to oncoming RN, all questions answered at this time.

## 2016-03-19 NOTE — Plan of Care (Signed)
Problem: Potential for Infection  Goal: Remains infection free  Assess and monitor vital signs, skin (color, moisture, integrity, turgor), respiratory status, urinary and gastrointestinal status, and labs (WBC, cultures).  Administer antibiotics and antipyretics as ordered.  Ensure aseptic care of all intravenous lines, invasive tubes/drains and wounds. Monitor for signs and symptoms of infection (redness, warmth, discharge, increased body temperature). Wash hands properly before and after each patient care activity.  Follow isolation guidelines per hospital protocol/policy.  Collaborate with interdisciplinary team and initiate plan and interventions as ordered.   Outcome: Progressing  Surgical sites are CDI, no signs of infection.    Problem: Inadequate Airway Clearance  Goal: Patient will maintain patent airway  Outcome: Progressing  Pt continues on home O2 at 2LPM via NC    Problem: Insufficient Nutritional Intake  Goal: Patient's nutritional intake is adequate  Assess and monitor food intake and supplements, patient food preferences, nausea, vomiting, labs, oral cavity (gums, teeth, tongue, mucosa), proper denture fit, and cultural beliefs.  Monitor for signs of hypoglycemia and hyperglycemia.  Collaborate with interdisciplinary team and initiate plan and interventions as ordered.   Outcome: Not Progressing  Offering foods the pt likes, but pt is not interested in eating.

## 2016-03-19 NOTE — Progress Notes (Signed)
**THIS IS A MEDICAL STUDENT NOTE FOR EDUCATIONAL PURPOSES ONLY**        INPATIENT PROGRESS NOTE:  Name: Eduardo Khan.  MRN: [id number]  Date of Service: 03/19/2016    CC:  Open AAA repair    Subjective/Interval Hx:  - NAE, VSS overnight  - Tolerating po but with minimal appetite  - Continuing on 4L NC with O2 low 90s  - Pain controlled well    Objective:  Vital Sign Ranges for Last 24 Hours:  BP  Min: 102/60  Max: 157/84  Pulse  Min: 80  Max: 101  Resp  Min: 13  Max: 27  Temp  Min: 36.7 ?C (98 ?F)  Max: 37.1 ?C (98.7 ?F)  SpO2  Min: 91 %  Max: 99 %  Vitals:    03/19/16 0712   BP:    Pulse:    Resp:    Temp:    SpO2: 93%       Intake/Output Summary (Last 24 hours) at 03/19/16 0753  Last data filed at 03/19/16 7829   Gross per 24 hour   Intake           1402.8 ml   Output             2215 ml   Net           -812.2 ml       Physical Exam:  Alert, appropriate, appears uncomfortable with coughing but otherwise in NAD  Chest: non-labored, equal breath sounds bilaterally  Heart: well perfused, regular rate, irregularly irregular rhythm, no JVD  Abdomen: soft, non distended, appropriately tender  Incisions/dressings d/c/i: midline abdomen, right inguinal, right femoral with no surrounding tenderness, warmth or erythema  G/U: N/A  Drain: N/A  Ext: feet slightly cool to touch but not mottled or cyanotic in appearance, palpable R DP and PT, and L DP pulses bilaterally about 1+    Labs:   Lab Results   Component Value Date    NA 135 03/18/2016    NA 139 05/16/2013    K 3.5 03/18/2016    K 3.1 (L) 05/16/2013    CL 103 03/18/2016    CL 104 05/16/2013    BUN 14 03/18/2016    BUN 20 05/16/2013    GLU 98 03/18/2016    GLU 110 (H) 05/16/2013     Lab Results   Component Value Date    HCT 32.3 (L) 03/18/2016    HCT 54.2 (H) 05/16/2013    MCV 99.0 03/18/2016    MCV 99.4 (H) 05/16/2013    RDW 12.8 03/18/2016    RDW 13.6 05/16/2013         Assessment and Plan:  Eduardo Khan. is a 75 y.o. male now POD4 s/p open AAA repair,  right external iliac endarterectomy, right profunda femoral thromboembolectomy.    Doing well with pain control, intact pulses, BP stabilized. Good UOP and renal function. Tentative plan for d/c Saturday or Sunday pending PT rehabilitation and discharge planning.    I. Neuro  - APAP, oxycodone, lidocaine patch    II. Resp  - BS equal, suspect hypoxemia due to chronic lung disease with atelectasis and poor inspiratory effort from pain. Afebrile, and clinical suspicion for pneumonia low.  - Home meds per RT (arfometerol, budesonide, ipratropium)  - Encourage robust IS/pulm hygiene    III. CV  - Monitor on tele  - ASA 81mg  qd, atorvastatin 20mg  qd  - BP stable, HR about  100-105 today. Increase Metoprolol back to 12.5mg  TID    IV. Renal  - Daily renal function panel  - Flomax 0.4mg  po qd  - Lasix 20mg  IV x 1    V. FEN/GI  - Regular diet  - Miralax qd    VI. ID  - NTD    VI. Heme  - Transitioned to Lovenox prophylactic dosing 40mg  qd, to be continued upon d/c for bridging to home coumadin  - Coumadin for titrating back to home INR goals  - CBC and coag panel qd    VII. MSK  - OOB as able, PT      The attending of record for the above patient encounter is Dr. Tish Men, MD.        Suezanne Cheshire, MS-3  [place] Medical Student  General Surgery        **THIS IS A MEDICAL STUDENT NOTE FOR EDUCATIONAL PURPOSES ONLY**

## 2016-03-20 LAB — PLATELET COUNT: Platelet Count: 100 10*3/ÂµL — ABNORMAL LOW (ref 150–400)

## 2016-03-20 LAB — PROTIME-INR
INR: 1.1 (ref 0.9–1.2)
Protime: 12.8 Seconds (ref 9.9–13.4)

## 2016-03-20 MED ORDER — warfarin (COUMADIN) tablet 5 mg
5 | Freq: Every day | ORAL | Status: DC
Start: 2016-03-20 — End: 2016-03-20

## 2016-03-20 MED ORDER — acetaminophen (TYLENOL) 325 mg tablet
325 | ORAL | Status: AC | PRN
Start: 2016-03-20 — End: 2016-03-30

## 2016-03-20 MED ORDER — oxyCODONE (ROXICODONE) 5 MG immediate release tablet
5 | ORAL_TABLET | ORAL | 0 refills | 9.50000 days | Status: AC | PRN
Start: 2016-03-20 — End: 2016-03-30

## 2016-03-20 MED ORDER — aspirin 81 MG chewable tablet
81 | ORAL_TABLET | Freq: Every day | ORAL | 11 refills | 30.00000 days | Status: AC
Start: 2016-03-20 — End: 2017-03-21

## 2016-03-20 MED FILL — BROVANA 15 MCG/2 ML SOLUTION FOR NEBULIZATION: 15 mcg/2 mL | RESPIRATORY_TRACT | Qty: 2

## 2016-03-20 MED FILL — IPRATROPIUM 0.5 MG-ALBUTEROL 3 MG (2.5 MG BASE)/3 ML NEBULIZATION SOLN: 0.5 mg-3 mg(2.5 mg base)/3 mL | RESPIRATORY_TRACT | Qty: 1

## 2016-03-20 MED FILL — TAMSULOSIN 0.4 MG CAPSULE: 0.4 mg | ORAL | Qty: 1

## 2016-03-20 MED FILL — ATORVASTATIN 10 MG TABLET: 10 mg | ORAL | Qty: 2

## 2016-03-20 MED FILL — MIRALAX 17 GRAM ORAL POWDER PACKET: 17 g | ORAL | Qty: 1

## 2016-03-20 MED FILL — METOPROLOL TARTRATE 25 MG TABLET: 25 mg | ORAL | Qty: 1

## 2016-03-20 MED FILL — FAMOTIDINE 20 MG TABLET: 20 mg | ORAL | Qty: 1

## 2016-03-20 MED FILL — COUMADIN 5 MG TABLET: 5 mg | ORAL | Qty: 1

## 2016-03-20 MED FILL — BUDESONIDE 0.5 MG/2 ML SUSPENSION FOR NEBULIZATION: 0.5 | RESPIRATORY_TRACT | Qty: 2

## 2016-03-20 MED FILL — ASPIRIN 81 MG CHEWABLE TABLET: 81 mg | ORAL | Qty: 1

## 2016-03-20 MED FILL — NICOTINE 21 MG/24 HR DAILY TRANSDERMAL PATCH: 21 mg/24 hr | TRANSDERMAL | Qty: 1

## 2016-03-20 MED FILL — IPRATROPIUM BROMIDE 0.02 % SOLUTION FOR INHALATION: 0.02 % | RESPIRATORY_TRACT | Qty: 2.5

## 2016-03-20 MED FILL — OXYCODONE 5 MG TABLET: 5 mg | ORAL | Qty: 1

## 2016-03-20 MED FILL — ENOXAPARIN 40 MG/0.4 ML SUBCUTANEOUS SYRINGE: 40 | SUBCUTANEOUS | Qty: 1

## 2016-03-20 NOTE — Progress Notes (Addendum)
32 - Received report from [employer], pt VSS and pt has no signs of pain at the time of transition of care.  Critical care standards of care continued. Labs, medications, orders, kardex reviewed. Side rails up, bed in low/locked position, nurse call light/bedside table within reach, cardiac monitor on/alarms reviewed.    1030 - MD Jones at the bedside, after discussing pt's condition and pt's desire to go home today, MD Yetta Barre the pt may be discharged.  MD Evelene Croon notified and will begin the discharge process when she is available.    1140 - Discharge orders placed by MD Evelene Croon.  Pt and family are agreeable, will begin the discharge process.     1230 - Pt's daughter, Ulyess Blossom, requests the pt go for a walk without oxygen before discharging home.  Pt has moments of desaturation to the mid 80's, but quickly rebounds with rest.  Pt ambulates to the end of the hall and back to his room with no problems.  Pt placed on 2LPM via NC for 2 minutes after ambulation and quickly rebounds to mid 90's.  Pt, Fonda, and pt's wife would like to proceed with the discharge.  Spoke with DCP Herbert Seta who states Home Health Care is set up and will be calling the family to setup care in the next 24-48 hours.    1300 - Pt receives discharge instructions and had no further questions.  Unable to schedule follow-up appointment, since it is Saturday, but family knows to call MD Yetta Barre' office on Monday to schedule an appointment in the next 7-10 days. Pt given hard copy of prescription for oxycodone.  Pt's 1300 metoprolol was held as order parameters are not met.  Pt discharged home via private vehicle with his daughter, Ulyess Blossom, driving. Pt verifies he has all of his personal possessions.  Pt transferred from his room to the private vehicle via wheelchair and pt transferred to the vehicle with no complications.  Pt and family very thankful for the care he received at [facility].

## 2016-03-20 NOTE — Plan of Care (Signed)
Pt being discharged home per MD Yetta Barre, pt is agreeable to the treatment plan

## 2016-03-20 NOTE — Discharge Planning (AHS/AVS) (Signed)
Met with patient, spouse and daughter Ulyess Blossom.  They would like [facility] at discharge, [facility] signed and in chart.  Patient will be discharging to his daughter, Robley Fries, home.  Fonda obtained a tall walker, Bed Side Commode and shower seat/bench at [facility] for use at discharge.  Patient uses home Oxygen at night provided by [employer].    Marrian Salvage, RN, BSN Discharge Planner 03/20/2016 12:29 PM

## 2016-03-20 NOTE — Progress Notes (Signed)
PHARMACIST PROGRESS NOTE     The following pharmacy services were provided: warfarin dosing    SUBJECTIVE / OBJECTIVE     Eduardo HARRY Buckner JR. is a 75 y.o. Male admitted on 03/15/2016.    I have reviewed available relevant labs, vitals and flowsheets today.  The following are especially relevant:    INR 1.1, platelets 100 (03/20/16)  INR 1.0, platelets 77 (03/19/16)  INR 1.2, platelets 70 (03/18/16)    ASSESSMENT       Warfarin Dosing  Warfarin indication:  A-Fib  CHA2DS2-VASc Score:  3  Goal INR:  2 - 3  The current INR is subtherapeutic.    Items that may affect INR:  Drug-disease interaction(s) and Additional anticoagulant(s)  The following disease states may affect warfarin sensitivity:  Thrombocytopenia  The patient also has the following additional anticoagulant(s) ordered:   Aspirin, enoxaparin    Home warfarin regimen:  2.5 mg Sunday, Tuesday, Thursday, Saturday.  5 mg Monday, Wednesday, Friday.    PLAN       Warfarin Dosing  Adjust from home dose to Warfarin 5mg  PO Daily.  INR daily, pharmacy to monitor.           Signed by: Bettey Costa, [facility]

## 2016-03-20 NOTE — Discharge Planning (AHS/AVS) (Signed)
Transitional Care Plan     Name:Eduardo Khan. MRN#: [id number]   Date of Birth:05-03-1940 [id number] Mayford Knife, MD   Admit Date: 03/15/2016 Discharge Date: 03/20/2016   Admitting Physician: Gara Kroner, MD Readmission Risk: medium   Principal Problem this admit:  <principal problem not specified> Risk Factors:Potential Risk Factors: Change in care needs, Severity of injury/illness    Goals of Care Conversation:       Primary language if other than English:NA    Emergency Contacts:  Extended Emergency Contact Information  Primary Emergency Contact: Mclennan,Rose  Address: [address]           [place] [address] [place]  Home Phone: [phone]  Relation: Spouse  Secondary Emergency Contact: Ellene Route [place]  Home Phone: [phone]  Relation: Daughter Follow ups:   No follow-up provider specified.     INTERVENTIONS   Discharge Disposition: Home Health Care Service     Home Health Agency: [facility] (SN/OT/PT)                            Home Oxygen Provider:                LPM:  [employer]IAL [employer]      POLST:     ADDITIONAL INFORMATION    Patient discharging home with daughter, surgical follow-up in 7-10 days.       A Copy of this note was routed to:  Levander Campion, MD                                             Resource Management: Donnie Coffin, RN

## 2016-03-20 NOTE — Progress Notes (Signed)
03/19/2016 1910 Bedside report received from [facility], CCU standards continued including bed in low position with exit alarm on and bedside table and call light within reach.  03/20/2016 0710 Bedside report given to oncoming RN, all questions answered at this time.

## 2016-03-20 NOTE — Discharge Summary (Signed)
INPATIENT PHYSICIAN DISCHARGE SUMMARY    Author: Unice Cobble, MD  Attending Physician: Gara Kroner, MD    Patient ID:  Name: Eduardo Khan.  DOB: 09-12-1940 75 y.o.  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) [I71.4]    Significant Diagnostic Studies: [place]  Results for orders placed or performed during the hospital encounter of 03/15/16 (from the past 24 hour(s))   Protime Panel -Daily    Collection Time: 03/20/16  3:31 AM   Result Value Ref Range    Protime 12.8 9.9 - 13.4 Seconds    INR 1.1 0.9 - 1.2   Platelet Count -AM Draw    Collection Time: 03/20/16  3:31 AM   Result Value Ref Range    Platelet Count 100 (L) 150 - 400 10*3/?L          Procedures/Treatments:   11/27: AAA repair with bifurcated graft to L CIA, R EIA with ligation of R hypogastric, and endarterectomy of R EIA, CFA, SFA, and profunda and thromboembolectomy     Consults:   IP CONSULT TO DISCHARGE PLANNER  IP CONSULT TO VASCULAR ACCESS TEAM  IP CONSULT TO PHARMACY  IP CONSULT TO DISCHARGE PLANNER    Physical Exam:   BP 137/70   Pulse 86   Temp 36.7 ?C (98 ?F) (Temporal)   Resp 18   Ht 6' 4 (1.93 m)   Wt 180 lb 1.9 oz (81.7 kg)   SpO2 96%   BMI 21.92 kg/m?     A&Ox3, NAD  Chest: non-labored  Heart: well perfused  Abdomen: soft, mildly distended, minimally tender  Incisions/dressings d/c/i: y  Ext: wwp.  +DP/PT bilaterally    Hospital Course:   Patient was admitted to the hospital and underwent the above procedure. The patient tolerated the procedure without any intraoperative complications. He was monitored in the ICU. His diet advanced once he had return of bowel function.Marland Kitchen Rest of the hospital course was unremarkable and he was discharged to home with [facility] when they met the  discharge criteria     Discharge Medications:   Chau, Sawin.   Discharge Medications VHQ:[id number]    Printed on:03/20/16 1141   Medication Information            acetaminophen (TYLENOL) 325 mg tablet  Take 2 tablets by mouth every 4 hours as needed for up to 10 days.        acetaminophen (TYLENOL) 500 MG tablet  Take 1,000 mg by mouth every 6 hours as needed for Pain.        aspirin 81 MG chewable tablet  Chew 1 tablet by mouth daily.        diphenhydramine-acetaminophen (TYLENOL PM) 25-500 mg Tab  Take 2 tablets by mouth every night at bedtime.        fluticasone (FLONASE) 50 mcg/actuation nasal spray  1 spray by Nasal route Daily.        fluticasone-salmeterol (ADVAIR) 500-50 mcg/dose DsDv  Inhale 1 puff into the lungs 2 times daily.        ipratropium-albuterol (DUO-NEB) 0.5 mg-3 mg(2.5 mg base)/3 mL nebulizer solution  Take 3 mLs by nebulization every 6 (six) hours as needed for Wheezing.  KLOR-CON M10 10 mEq tablet  Take 10 mEq by mouth daily.         MULTIVIT WITH IRON,MINERALS (MULTIVITAMIN-IRON-MINERALS ORAL)  Take 1 tablet by mouth every morning.        nadolol (CORGARD) 40 MG tablet  Take 40 mg by mouth daily.        OMEPRAZOLE ORAL  Take 20 mg by mouth daily.         oxyCODONE (ROXICODONE) 5 MG immediate release tablet  Take 1-2 tablets by mouth every 3 hours as needed for up to 10 days.        OXYGEN-AIR DELIVERY SYSTEMS MISC  2 L by Miscellaneous route every night at bedtime.        tiotropium (SPIRIVA) 18 mcg inhalation  Inhale 18 mcg into the lungs daily.        warfarin (COUMADIN) 2.5 MG tablet  Take 2.5 mg by mouth daily. On Sun/Tue/Thur/Sat        warfarin (COUMADIN) 5 MG tablet  Take 5 mg by mouth daily. On Mon/Wed/Fri            Patient Instructions/Follow-up Appointments:     Activity: activity as tolerated  Diet: regular diet  Discharge Disposition: To home on home health: physical therapy  Follow-up with Dr. Yetta Barre      LOS: 5 days   Time spent on discharge: < 30 minutes      Signed:  Unice Cobble, MD  General Surgery, R4      11:41 AM  03/20/2016    Route PCP

## 2016-03-20 NOTE — Discharge Planning (AHS/AVS) (Signed)
Per Discharge Planner Marrian Salvage, RN I sent Discharge Orders to [facility]. Patient is discharging today. He will need SN/PT/OT.   Eligha Bridegroom, Case Management Specialist  03/20/2016 11:48 AM

## 2016-03-26 NOTE — Telephone Encounter (Signed)
[place] POST VISIT CALL    Patient Information     How is the patient feeling since being discharged from the hospital?  Pt states he's doing good. Surgery site without sign of infection. HH was there today and said everything looked good. Denies constipation, has had diarrhea since discharge but is starting to improve.     Were discharge instructions gone over with you prior to discharge?  Yes    Does the patient have the support needed at home?  Yes, patient has support at home      Medications     Does patient understand what medication they are to be taking? Yes     Has patient filled all medications prescribed at discharge?   Yes, all medications prescribed at discharge were filled    Is the patient on blood thinner? Yes    Who is managing? Levander Campion, MD    Any questions about medications and/or side effects? Yes    Additional Comments:  Pt hadn't been taking the 81 mg ASA, states he had always been told not to take ASA since he was on Warfarin. I explained the ASA worked in a different way than the Warfarin, makes the blood less sticky. Agreed he shouldn't take any more ASA than prescribed but the surgeon prescribed the 81 mg ASA. He is going to start taking it.     Follow Up     Does patient have follow up appointment with PCP? Yes  No f/u ordered but pt has scheduled appts coming up.   Is the follow-up with PCP within 3-5 days of discharge? Within 6-14 days    Does patient have a follow-up with a specialist? Yes  Dr Yetta Barre 12/13  Does patient need help with transportation to appointments? Denies transportation needs at this time    If home care services were ordered at the time of discharge, have the made contact with the patient? Yes           Completion of Call     Are there any further questions or concerns the patient has prior to completing the call?  Denies any questions or concerns at this time

## 2016-04-08 LAB — BASIC METABOLIC PANEL
Anion Gap: 7.3 mmol/L (ref 3.0–11.0)
BUN: 12 mg/dL (ref 6–23)
CO2 - Carbon Dioxide: 26.7 mmol/L (ref 21.0–31.0)
Calcium: 8.6 mg/dL (ref 8.6–10.3)
Chloride: 109 mmol/L (ref 98–111)
Creatinine: 0.72 mg/dL (ref 0.65–1.30)
GFR Estimate: >60 mL/min/{1.73_m2} (ref >=60)
Glucose: 92 mg/dL (ref 80–99)
Potassium: 4 mmol/L (ref 3.5–5.1)
Sodium: 143 mmol/L (ref 135–143)

## 2016-04-08 LAB — CBC WITH AUTO DIFFERENTIAL
Basophils %: 1 % (ref 0–2)
Basophils, Absolute: 0 10*3/ÂµL (ref 0.0–0.2)
Eosinophils %: 2 % (ref 0–7)
Eosinophils, Absolute: 0.1 10*3/ÂµL (ref 0.0–0.7)
HCT: 39.5 % — ABNORMAL LOW (ref 42.0–54.0)
Hemoglobin: 13.4 g/dL (ref 12.0–18.0)
Lymphocytes %: 22 % — ABNORMAL LOW (ref 25–45)
Lymphocytes, Absolute: 1.1 10*3/ÂµL (ref 1.1–4.3)
MCH: 33.1 pg (ref 27.0–34.0)
MCHC: 33.9 g/dL (ref 32.0–36.0)
MCV: 97.7 fL (ref 81.0–99.0)
MPV: 9.2 fL (ref 7.4–10.4)
Monocytes %: 8 % (ref 0–12)
Monocytes, Absolute: 0.4 10*3/ÂµL (ref 0.0–1.2)
Neutrophils %: 68 % (ref 35–70)
Neutrophils, Absolute: 3.3 10*3/ÂµL (ref 1.6–7.3)
Platelet Count: 146 10*3/ÂµL — ABNORMAL LOW (ref 150–400)
RBC: 4.04 10*6/ÂµL — ABNORMAL LOW (ref 4.70–6.10)
RDW: 13.3 % (ref 11.5–14.5)
WBC: 4.9 10*3/ÂµL (ref 4.8–10.8)

## 2016-06-22 ENCOUNTER — Other Ambulatory Visit: Admit: 2016-06-22 | Discharge: 2016-06-22 | Payer: MEDICARE | Primary: MD

## 2016-06-22 DIAGNOSIS — Z7901 Long term (current) use of anticoagulants: Secondary | ICD-10-CM

## 2016-06-22 LAB — PROTIME-INR
INR: 2.5 — ABNORMAL HIGH (ref 0.9–1.2)
Protime: 27.2 Seconds — ABNORMAL HIGH (ref 10.0–13.0)

## 2016-06-27 ENCOUNTER — Emergency Department: Admit: 2016-06-28 | Payer: MEDICARE | Primary: MD

## 2016-06-27 ENCOUNTER — Emergency Department: Payer: MEDICARE | Primary: MD

## 2016-06-27 DIAGNOSIS — J441 Chronic obstructive pulmonary disease with (acute) exacerbation: Secondary | ICD-10-CM

## 2016-06-27 NOTE — ED Notes (Addendum)
Per daughter pt has had intermittent confusion and dizziness that started at 4pm. Pt states he has generalized weakness

## 2016-06-27 NOTE — Discharge Instructions (Signed)
Chronic Obstructive Pulmonary Disease  Chronic obstructive pulmonary disease (COPD) is a common lung problem. In COPD, the flow of air from the lungs is limited. The way your lungs work will probably never return to normal, but there are things you can do to improve your lungs and make yourself feel better. Your doctor may treat your condition with:  ? Medicines.  ? Oxygen.  ? Lung surgery.  ? Changes to your diet.  ? Rehabilitation. This may involve a team of specialists.  HOME CARE  ? Take all medicines as told by your doctor.  ? Avoid medicines or cough syrups that dry up your airway (such as antihistamines) and do not allow you to get rid of thick spit. You do not need to avoid them if told differently by your doctor.  ? If you smoke, stop. Smoking makes the problem worse.  ? Avoid being around things that make your breathing worse (like smoke, chemicals, and fumes).  ? Use oxygen therapy and therapy to help improve your lungs (pulmonary rehabilitation) if told by your doctor. If you need home oxygen therapy, ask your doctor if you should buy a tool to measure your oxygen level (oximeter).  ? Avoid people who have a sickness you can catch (contagious).  ? Avoid going outside when it is very hot, cold, or humid.  ? Eat healthy foods. Eat smaller meals more often. Rest before meals.  ? Stay active, but remember to also rest.  ? Make sure to get all the shots (vaccines) your doctor recommends. Ask your doctor if you need a pneumonia shot.  ? Learn and use tips on how to relax.  ? Learn and use tips on how to control your breathing as told by your doctor. Try:    Breathing in (inhaling) through your nose for 1 second. Then, pucker your lips and breath out (exhale) through your lips for 2 seconds.    Putting one hand on your belly (abdomen). Breathe in slowly through your nose for 1 second. Your hand on your belly should move out. Pucker your lips and breathe out slowly through your lips. Your hand on your belly  should move in as you breathe out.  ? Learn and use controlled coughing to clear thick spit from your lungs. The steps are:  . Lean your head a little forward.  . Breathe in deeply.  . Try to hold your breath for 3 seconds.  Marland Kitchen Keep your mouth slightly open while coughing 2 times.  Marland Kitchen Spit any thick spit out into a tissue.  Marland Kitchen Rest and do the steps again 1 or 2 times as needed.  GET HELP IF:  ? You cough up more thick spit than usual.  ? There is a change in the color or thickness of the spit.  ? It is harder to breathe than usual.  ? Your breathing is faster than usual.  GET HELP RIGHT AWAY IF:  ? You have shortness of breath while resting.  ? You have shortness of breath that stops you from:    Being able to talk.    Doing normal activities.  ? You chest hurts for longer than 5 minutes.  ? Your skin color is more blue than usual.  ? Your pulse oximeter shows that you have low oxygen for longer than 5 minutes.  MAKE SURE YOU:  ? Understand these instructions.  ? Will watch your condition.  ? Will get help right away if you are not doing  well or get worse.     This information is not intended to replace advice given to you by your health care provider. Make sure you discuss any questions you have with your health care provider.     Document Released: 09/22/2007 Document Revised: 04/26/2014 Document Reviewed: 11/30/2012  Elsevier Interactive Patient Education ?2017 Elsevier Inc.    Prednisone tablets  What is this medicine?  PREDNISONE (PRED ni sone) is a corticosteroid. It is commonly used to treat inflammation of the skin, joints, lungs, and other organs. Common conditions treated include asthma, allergies, and arthritis. It is also used for other conditions, such as blood disorders and diseases of the adrenal glands.  This medicine may be used for other purposes; ask your health care provider or pharmacist if you have questions.  COMMON BRAND NAME(S): Deltasone, Predone, Sterapred, Sterapred DS  What should I tell my  health care provider before I take this medicine?  They need to know if you have any of these conditions:  -Cushing's syndrome  -diabetes  -glaucoma  -heart disease  -high blood pressure  -infection (especially a virus infection such as chickenpox, cold sores, or herpes)  -kidney disease  -liver disease  -mental illness  -myasthenia gravis  -osteoporosis  -seizures  -stomach or intestine problems  -thyroid disease  -an unusual or allergic reaction to lactose, prednisone, other medicines, foods, dyes, or preservatives  -pregnant or trying to get pregnant  -breast-feeding  How should I use this medicine?  Take this medicine by mouth with a glass of water. Follow the directions on the prescription label. Take this medicine with food. If you are taking this medicine once a day, take it in the morning. Do not take more medicine than you are told to take. Do not suddenly stop taking your medicine because you may develop a severe reaction. Your doctor will tell you how much medicine to take. If your doctor wants you to stop the medicine, the dose may be slowly lowered over time to avoid any side effects.  Talk to your pediatrician regarding the use of this medicine in children. Special care may be needed.  Overdosage: If you think you have taken too much of this medicine contact a poison control center or emergency room at once.  NOTE: This medicine is only for you. Do not share this medicine with others.  What if I miss a dose?  If you miss a dose, take it as soon as you can. If it is almost time for your next dose, talk to your doctor or health care professional. You may need to miss a dose or take an extra dose. Do not take double or extra doses without advice.  What may interact with this medicine?  Do not take this medicine with any of the following medications:  -metyrapone  -mifepristone  This medicine may also interact with the following medications:  -aminoglutethimide  -amphotericin B  -aspirin and aspirin-like  medicines  -barbiturates  -certain medicines for diabetes, like glipizide or glyburide  -cholestyramine  -cholinesterase inhibitors  -cyclosporine  -digoxin  -diuretics  -ephedrine  -male hormones, like estrogens and birth control pills  -isoniazid  -ketoconazole  -NSAIDS, medicines for pain and inflammation, like ibuprofen or naproxen  -phenytoin  -rifampin  -toxoids  -vaccines  -warfarin  This list may not describe all possible interactions. Give your health care provider a list of all the medicines, herbs, non-prescription drugs, or dietary supplements you use. Also tell them if you smoke, drink alcohol,  or use illegal drugs. Some items may interact with your medicine.  What should I watch for while using this medicine?  Visit your doctor or health care professional for regular checks on your progress. If you are taking this medicine over a prolonged period, carry an identification card with your name and address, the type and dose of your medicine, and your doctor's name and address.  This medicine may increase your risk of getting an infection. Tell your doctor or health care professional if you are around anyone with measles or chickenpox, or if you develop sores or blisters that do not heal properly.  If you are going to have surgery, tell your doctor or health care professional that you have taken this medicine within the last twelve months.  Ask your doctor or health care professional about your diet. You may need to lower the amount of salt you eat.  This medicine may affect blood sugar levels. If you have diabetes, check with your doctor or health care professional before you change your diet or the dose of your diabetic medicine.  What side effects may I notice from receiving this medicine?  Side effects that you should report to your doctor or health care professional as soon as possible:  -allergic reactions like skin rash, itching or hives, swelling of the face, lips, or tongue  -changes in emotions  or moods  -changes in vision  -depressed mood  -eye pain  -fever or chills, cough, sore throat, pain or difficulty passing urine  -increased thirst  -swelling of ankles, feet  Side effects that usually do not require medical attention (report to your doctor or health care professional if they continue or are bothersome):  -confusion, excitement, restlessness  -headache  -nausea, vomiting  -skin problems, acne, thin and shiny skin  -trouble sleeping  -weight gain  This list may not describe all possible side effects. Call your doctor for medical advice about side effects. You may report side effects to FDA at 1-800-FDA-1088.  Where should I keep my medicine?  Keep out of the reach of children.  Store at room temperature between 15 and 30 degrees C (59 and 86 degrees F). Protect from light. Keep container tightly closed. Throw away any unused medicine after the expiration date.  NOTE: This sheet is a summary. It may not cover all possible information. If you have questions about this medicine, talk to your doctor, pharmacist, or health care provider.     ? 2017, Elsevier/Gold Standard. (2010-11-19 10:57:14)    Azithromycin tablets  What is this medicine?  AZITHROMYCIN (az ith roe MYE sin) is a macrolide antibiotic. It is used to treat or prevent certain kinds of bacterial infections. It will not work for colds, flu, or other viral infections.  This medicine may be used for other purposes; ask your health care provider or pharmacist if you have questions.  COMMON BRAND NAME(S): Zithromax, Zithromax Tri-Pak, Zithromax Z-Pak  What should I tell my health care provider before I take this medicine?  They need to know if you have any of these conditions:  -kidney disease  -liver disease  -irregular heartbeat or heart disease  -an unusual or allergic reaction to azithromycin, erythromycin, other macrolide antibiotics, foods, dyes, or preservatives  -pregnant or trying to get pregnant  -breast-feeding  How should I use this  medicine?  Take this medicine by mouth with a full glass of water. Follow the directions on the prescription label. The tablets can be taken with food  or on an empty stomach. If the medicine upsets your stomach, take it with food. Take your medicine at regular intervals. Do not take your medicine more often than directed. Take all of your medicine as directed even if you think your are better. Do not skip doses or stop your medicine early.  Talk to your pediatrician regarding the use of this medicine in children. While this drug may be prescribed for children as young as 6 months for selected conditions, precautions do apply.  Overdosage: If you think you have taken too much of this medicine contact a poison control center or emergency room at once.  NOTE: This medicine is only for you. Do not share this medicine with others.  What if I miss a dose?  If you miss a dose, take it as soon as you can. If it is almost time for your next dose, take only that dose. Do not take double or extra doses.  What may interact with this medicine?  Do not take this medicine with any of the following medications:  -lincomycin  This medicine may also interact with the following medications:  -amiodarone  -antacids  -birth control pills  -cyclosporine  -digoxin  -magnesium  -nelfinavir  -phenytoin  -warfarin  This list may not describe all possible interactions. Give your health care provider a list of all the medicines, herbs, non-prescription drugs, or dietary supplements you use. Also tell them if you smoke, drink alcohol, or use illegal drugs. Some items may interact with your medicine.  What should I watch for while using this medicine?  Tell your doctor or healthcare professional if your symptoms do not start to get better or if they get worse.  Do not treat diarrhea with over the counter products. Contact your doctor if you have diarrhea that lasts more than 2 days or if it is severe and watery.  This medicine can make you more  sensitive to the sun. Keep out of the sun. If you cannot avoid being in the sun, wear protective clothing and use sunscreen. Do not use sun lamps or tanning beds/booths.  What side effects may I notice from receiving this medicine?  Side effects that you should report to your doctor or health care professional as soon as possible:  -allergic reactions like skin rash, itching or hives, swelling of the face, lips, or tongue  -confusion, nightmares or hallucinations  -dark urine  -difficulty breathing  -hearing loss  -irregular heartbeat or chest pain  -pain or difficulty passing urine  -redness, blistering, peeling or loosening of the skin, including inside the mouth  -white patches or sores in the mouth  -yellowing of the eyes or skin  Side effects that usually do not require medical attention (report to your doctor or health care professional if they continue or are bothersome):  -diarrhea  -dizziness, drowsiness  -headache  -stomach upset or vomiting  -tooth discoloration  -vaginal irritation  This list may not describe all possible side effects. Call your doctor for medical advice about side effects. You may report side effects to FDA at 1-800-FDA-1088.  Where should I keep my medicine?  Keep out of the reach of children.  Store at room temperature between 15 and 30 degrees C (59 and 86 degrees F). Throw away any unused medicine after the expiration date.  NOTE: This sheet is a summary. It may not cover all possible information. If you have questions about this medicine, talk to your doctor, pharmacist, or health care provider.     ?  2017, Elsevier/Gold Standard. (2015-06-03 15:26:03)

## 2016-06-27 NOTE — ED Triage Notes (Signed)
Pt reports SOB onset 2-3 hours PTA, weakness, fever 101.f, and wobbly while ambulating.  Daughter reports pt hx of pneumonia and feels this is similar.  Pt presents with tremors that are more than normal.  Pt denies chest pain; irregular pulse.

## 2016-06-27 NOTE — ED Provider Notes (Signed)
[facility] EM[facility]GENC[facility] DEPART[facility] VISIT NOTE    History and Physical     Name: Eduardo Khan.  DOB: 11-01-1940 76 y.o.  MRN: [id number]  CSN: [id number]    HISTORY:     CHIEF COMPLAINT    Feels weak and staring off    HPI    Nahiem Dredge. is a 76 y.o. male who presents the ER for evaluation of an episode this afternoon when he was staring off and wouldn't focus when his wife was talking to him. He states he also feels more weak than normal and slightly more short of breath than normal. The symptoms began around 11 AM today and persisted and gotten worse since onset. He states this is exactly how he feels when he is got pneumonia twice in the past year. He denies any numbness/tingling/weakness in any extremity. He is not having difficulty with urination. He did have one episode of diarrhea prior to arrival. He states his vertigo symptoms began this morning and he took a Dramamine which did not help. He denies any chest pain or abdominal pain. Did have nausea but no vomiting    REVIEW OF SYSTEMS:  Please see HPI.  All other systems were reviewed and are negative.    PAST MEDICAL HISTORY    Past Medical History:   Diagnosis Date   . A-fib (CMS/HCC)    . A-fib (CMS/HCC)    . Cancer (CMS/HCC)     skin on hand and face   . Chronic pain     aching low back   . COPD (chronic obstructive pulmonary disease) (CMS/HCC)    . GERD (gastroesophageal reflux disease)     controlled   . Hypertension    . PONV (postoperative nausea and vomiting)     with morphine       SURGICAL HISTORY    Past Surgical History:   Procedure Laterality Date   . BACK SURGERY      L3/4 herniated disk 2015?   Marland Kitchen EYE SURGERY      both eyes, cataracts, right eye first of March   . PROCEDURE N/A 03/15/2016    Procedure: AORTOBILILIAC ABDOMINAL ANEUYRISM REPAIR; ENDARTERECTOMY RIGHT ILIAC, PROFUNDA  AND SUPERFICIAL FEMORAL ARTERY;  Surgeon: Gara Kroner, MD;  Location: [facility] OR;  Service: General Surgery;  Laterality: N/A;              HOME MEDICATION LIST    Home Medications     Med List Status:  NOT STARTED Set By: Alroy Dust, RN at 06/27/2016  8:29 PM            Last Dose     acetaminophen (TYLENOL) 500 MG tablet      Take 1,000 mg by mouth every 6 hours as needed for Pain.     aspirin 81 MG chewable tablet      Chew 1 tablet by mouth daily.     diphenhydramine-acetaminophen (TYLENOL PM) 25-500 mg Tab      Take 2 tablets by mouth every night at bedtime.     fluticasone (FLONASE) 50 mcg/actuation nasal spray      1 spray by Nasal route Daily.     fluticasone-salmeterol (ADVAIR) 500-50 mcg/dose DsDv      Inhale 1 puff into the lungs 2 times daily.     ipratropium-albuterol (DUO-NEB) 0.5 mg-3 mg(2.5 mg base)/3 mL nebulizer solution      Take 3 mLs by nebulization every 6 (six) hours as needed  for Wheezing.     KLOR-CON M10 10 mEq tablet      Take 10 mEq by mouth daily.      MULTIVIT WITH IRON,MINERALS (MULTIVITAMIN-IRON-MINERALS ORAL)      Take 1 tablet by mouth every morning.     nadolol (CORGARD) 40 MG tablet      Take 40 mg by mouth daily.     OMEPRAZOLE ORAL      Take 20 mg by mouth daily.      OXYGEN-AIR DELIVERY SYSTEMS MISC      2 L by Miscellaneous route every night at bedtime.     tiotropium (SPIRIVA) 18 mcg inhalation      Inhale 18 mcg into the lungs daily.     warfarin (COUMADIN) 2.5 MG tablet      Take 2.5 mg by mouth daily. On Sun/Tue/Thur/Sat     warfarin (COUMADIN) 5 MG tablet      Take 5 mg by mouth daily. On Mon/Wed/Fri          Common OP Relevant Meds (NOT FULL MED LIST)         Stop Sig     predniSONE (DELTASONE) 20 MG tablet  Daily      03/16 2359 Take 2 tablets by mouth daily for 5 days.     aspirin 81 MG chewable tablet  Daily      12/03 2359 Chew 1 tablet by mouth daily.     warfarin (COUMADIN) 5 MG tablet  Daily      -- Take 5 mg by mouth daily. On Mon/Wed/Fri     warfarin (COUMADIN) 2.5 MG tablet  Daily      -- Take 2.5 mg by mouth daily. On Sun/Tue/Thur/Sat        ALLERGIES    Venom-honey bee; Yellow jacket  venom; Codeine; and Morphine    FAMILY HISTORY    No family history on file.    SOCIAL HISTORY    Social History   Substance Use Topics   . Smoking status: Current Every Day Smoker     Packs/day: 1.00     Years: 60.00   . Smokeless tobacco: Never Used   . Alcohol use 8.4 oz/week     14 Cans of beer per week      Comment: occasion         PHYSICAL EXAM:   INITIAL VITAL SIGNS:    Initial Vitals [06/27/16 1945]   BP (!) 161/96   Pulse 98   Resp 22   Temp 37.8 ?C (100 ?F)   SpO2 94 %         General:  Alert and oriented ?3 in no distress  HEENT:  Normocephalic, Atraumatic, PERRL. Oropharynx moist, no erythema or exudate.   Neck: Supple, no tenderness, no masses.   Chest:  Normal respiratory effort, no wheezes, distant breath sounds   Cardiovascular:  irregularly irregular heart rate not tachycardic, 2+ radial pulses bilaterally.   GI:  Soft, non-tender, no masses.    Extremities: Warm, well perfused.  Calves symmetrical and non-tender. No edema.  Skin:  No rashes or bruising noted.   Neurologic:  Alert & oriented x 3, nonfocal exam. Does have a baseline tremor that is noticed in both hands  Psychiatric: Mood and affect normal.    ASSESSMENT and PLAN:  With patient's history of these symptoms being present when he is had pneumonia twice in the past year we will get blood work and chest x-ray. His physical  exam was unremarkable for any obvious acute findings.    RESULTS :     LABS    Results for orders placed or performed during the hospital encounter of 06/27/16 (from the past 24 hour(s))   CBC with Auto Differential -STAT    Collection Time: 06/27/16  8:02 PM   Result Value Ref Range    WBC 9.9 4.8 - 10.8 10*3/?L    RBC 5.27 4.70 - 6.10 10*6/?L    Hemoglobin 16.7 12.0 - 18.0 g/dL    HCT 16.1 09.6 - 04.5 %    MCV 94.4 81.0 - 99.0 fL    MCH 31.7 27.0 - 34.0 pg    MCHC 33.6 32.0 - 36.0 g/dL    RDW 40.9 81.1 - 91.4 %    Platelet Count 105 (L) 150 - 400 10*3/?L    MPV 9.3 7.4 - 10.4 fL    Neutrophils % 87 (H) 35 - 70 %     Lymphocytes % 7 (L) 25 - 45 %    Monocytes % 6 0 - 12 %    Eosinophils % 0 0 - 7 %    Basophils % 0 0 - 2 %    Neutrophils, Absolute 8.5 (H) 1.6 - 7.3 10*3/?L    Lymphocytes, Absolute 0.7 (L) 1.1 - 4.3 10*3/?L    Monocytes, Absolute 0.6 0.0 - 1.2 10*3/?L    Eosinophils, Absolute 0.0 0.0 - 0.7 10*3/?L    Basophils, Absolute 0.0 0.0 - 0.2 10*3/?L    Differential Type Automated Differential    Comprehensive Metabolic Panel -STAT    Collection Time: 06/27/16  8:02 PM   Result Value Ref Range    Sodium 139 135 - 143 mmol/L    Potassium 3.7 3.5 - 5.1 mmol/L    Chloride 105 98 - 111 mmol/L    CO2 - Carbon Dioxide 23.4 21.0 - 31.0 mmol/L    Glucose 110 (H) 80 - 99 mg/dL    BUN 22 6 - 23 mg/dL    Creatinine 7.82 9.56 - 1.30 mg/dL    Calcium 9.5 8.6 - 21.3 mg/dL    AST - Aspartate Aminotransferase 17 10 - 50 IU/L    ALT - Alanine Aminotransferase 16 7 - 52 IU/L    Alkaline Phosphatase 122 (H) 34 - 104 IU/L    Bilirubin Total 1.6 (H) 0.3 - 1.2 mg/dL    Protein Total 6.5 6.0 - 8.0 g/dL    Albumin 4.3 3.5 - 5.0 g/dL    Globulin 2.2 2.2 - 3.7 g/dL    Albumin/Globulin Ratio 2.0 >0.9    Anion Gap 10.6 3.0 - 11.0 mmol/L    GFR Estimate >60 >=60 mL/min/1.64m*2    GFR Additional Info     Lactic Acid (ED) -STAT    Collection Time: 06/27/16  8:02 PM   Result Value Ref Range    Lactic Acid ED 1.6 0.5 - 2.2 mmol/L   Troponin-I ED -STAT    Collection Time: 06/27/16  8:02 PM   Result Value Ref Range    Troponin I <0.03 <=0.04 ng/mL       RADIOLOGY/PROCEDURES    X-ray chest PA and lateral    (Results Pending)       EKG:  A. fib rhythm with a rate of 86. I do not see signs of ST elevation there is no T-wave abnormality    ED COURSE & MEDICAL DECISION MAKING    Data results reviewed. Nursing note reviewed.   X-ray did  not show any obvious cardiopulmonary acute process. CBC and CMP were unremarkable with a normal lactic and normal troponin. Patient is breathing comfortably in the emergency department and I believe he may be having symptoms of  an early COPD exacerbation. We will start him on steroids here and send him home on azithromycin. He is to follow-up with his regular physician this week.    Medications   methylPREDNISolone sod suc (Solu-MEDROL) injection 125 mg (not administered)   azithromycin (ZITHROMAX) tablet 500 mg (not administered)   LORazepam (ATIVAN) injection 1 mg (1 mg Intravenous Given 06/27/16 2045)       DISPOSITION:  As above.    CLINICAL IMPRESSION:    SNOMED CT(R)   1. COPD exacerbation (CMS/HCC)  ACUTE EXACERBATION OF CHRONIC OBSTRUCTIVE AIRWAYS DISEASE             New Prescriptions    AZITHROMYCIN (ZITHROMAX Z-PAK) 250 MG TABLET    Take 2 tablets on day one and then 1 tablet daily for 4 days    PREDNISONE (DELTASONE) 20 MG TABLET    Take 2 tablets by mouth daily for 5 days.         **This document was created with the assistance of voice-to-text technology. Effort has been made to minimize transcription errors. Please allow for homonyms and other similar transcription errors, such as she in place of he and vice versa.Roger Kill, MD  06/27/16 2235

## 2016-06-27 NOTE — ED Provider Notes (Signed)
I saw this patient in my role as the ED provider at triage.  As such I did limited history and physical exam to confirm the patient's triage level and order appropriate studies (if any) to begin the patient's workup. The patient understands that further evaluation will be done when they are brought back to an ED room.     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

## 2016-06-27 NOTE — ED Notes (Signed)
Pt not able to produce urine

## 2016-06-28 ENCOUNTER — Inpatient Hospital Stay
Admit: 2016-06-28 | Discharge: 2016-06-28 | Disposition: A | Discharge status: Home / Self Care | Payer: MEDICARE | Attending: Family

## 2016-06-28 LAB — CBC WITH AUTO DIFFERENTIAL
Basophils %: 0 % (ref 0–2)
Basophils, Absolute: 0 10*3/ÂµL (ref 0.0–0.2)
Eosinophils %: 0 % (ref 0–7)
Eosinophils, Absolute: 0 10*3/ÂµL (ref 0.0–0.7)
HCT: 49.8 % (ref 42.0–54.0)
Hemoglobin: 16.7 g/dL (ref 12.0–18.0)
Lymphocytes %: 7 % — ABNORMAL LOW (ref 25–45)
Lymphocytes, Absolute: 0.7 10*3/ÂµL — ABNORMAL LOW (ref 1.1–4.3)
MCH: 31.7 pg (ref 27.0–34.0)
MCHC: 33.6 g/dL (ref 32.0–36.0)
MCV: 94.4 fL (ref 81.0–99.0)
MPV: 9.3 fL (ref 7.4–10.4)
Monocytes %: 6 % (ref 0–12)
Monocytes, Absolute: 0.6 10*3/ÂµL (ref 0.0–1.2)
Neutrophils %: 87 % — ABNORMAL HIGH (ref 35–70)
Neutrophils, Absolute: 8.5 10*3/ÂµL — ABNORMAL HIGH (ref 1.6–7.3)
Platelet Count: 105 10*3/ÂµL — ABNORMAL LOW (ref 150–400)
RBC: 5.27 10*6/ÂµL (ref 4.70–6.10)
RDW: 14.5 % (ref 11.5–14.5)
WBC: 9.9 10*3/ÂµL (ref 4.8–10.8)

## 2016-06-28 LAB — COMPREHENSIVE METABOLIC PANEL
ALT - Alanine Aminotransferase: 16 IU/L (ref 7–52)
AST - Aspartate Aminotransferase: 17 IU/L (ref 10–50)
Albumin/Globulin Ratio: 2 (ref >0.9)
Albumin: 4.3 g/dL (ref 3.5–5.0)
Alkaline Phosphatase: 122 IU/L — ABNORMAL HIGH (ref 34–104)
Anion Gap: 10.6 mmol/L (ref 3.0–11.0)
BUN: 22 mg/dL (ref 6–23)
Bilirubin Total: 1.6 mg/dL — ABNORMAL HIGH (ref 0.3–1.2)
CO2 - Carbon Dioxide: 23.4 mmol/L (ref 21.0–31.0)
Calcium: 9.5 mg/dL (ref 8.6–10.3)
Chloride: 105 mmol/L (ref 98–111)
Creatinine: 0.83 mg/dL (ref 0.65–1.30)
GFR Estimate: >60 mL/min/{1.73_m2} (ref >=60)
Globulin: 2.2 g/dL (ref 2.2–3.7)
Glucose: 110 mg/dL — ABNORMAL HIGH (ref 80–99)
Potassium: 3.7 mmol/L (ref 3.5–5.1)
Protein Total: 6.5 g/dL (ref 6.0–8.0)
Sodium: 139 mmol/L (ref 135–143)

## 2016-06-28 LAB — BLOOD CULTURE
Blood Culture Result: NO GROWTH
Blood Culture Result: NO GROWTH

## 2016-06-28 LAB — LACTIC ACID ED: Lactic Acid ED: 1.6 mmol/L (ref 0.5–2.2)

## 2016-06-28 LAB — TROPONIN-I ED: Troponin I: <0.03 ng/mL (ref <=0.04)

## 2016-06-28 MED ORDER — azithromycin (ZITHROMAX) tablet 500 mg
250 | Freq: Once | ORAL | Status: AC
Start: 2016-06-28 — End: 2016-06-27
  Administered 2016-06-28: 06:00:00 250 mg via ORAL

## 2016-06-28 MED ORDER — predniSONE (DELTASONE) 20 MG tablet
20 | ORAL_TABLET | Freq: Every day | ORAL | 0 refills | 11.50000 days | Status: DC
Start: 2016-06-28 — End: 2016-06-27

## 2016-06-28 MED ORDER — methylPREDNISolone sod suc (Solu-MEDROL) injection 125 mg
125 | Freq: Once | INTRAMUSCULAR | Status: AC
Start: 2016-06-28 — End: 2016-06-27
  Administered 2016-06-28: 06:00:00 125 mg via INTRAVENOUS

## 2016-06-28 MED ORDER — LORazepam (ATIVAN) injection 1 mg
2 | Freq: Once | INTRAMUSCULAR | Status: AC
Start: 2016-06-28 — End: 2016-06-27
  Administered 2016-06-28: 04:00:00 2 mg via INTRAVENOUS

## 2016-06-28 MED ORDER — azithromycin (ZITHROMAX Z-PAK) 250 MG tablet
250 | ORAL_TABLET | ORAL | 0 refills | 5.00000 days | Status: DC
Start: 2016-06-28 — End: 2016-06-27

## 2016-06-28 MED ORDER — predniSONE (DELTASONE) 20 MG tablet
20 | ORAL_TABLET | Freq: Every day | ORAL | 0 refills | 11.50000 days | Status: AC
Start: 2016-06-28 — End: 2016-07-02

## 2016-06-28 MED ORDER — azithromycin (ZITHROMAX) 250 MG tablet
250 | ORAL_TABLET | ORAL | 0 refills | Status: AC
Start: 2016-06-28 — End: 2016-07-02

## 2016-06-28 MED FILL — AZITHROMYCIN 250 MG TABLET: 250 mg | ORAL | Qty: 2

## 2016-06-28 MED FILL — LORAZEPAM 2 MG/ML INJECTION SOLUTION: 2 mg/mL | INTRAMUSCULAR | Qty: 1

## 2016-06-28 MED FILL — SOLU-MEDROL (PF) 125 MG/2 ML SOLUTION FOR INJECTION: 125 | INTRAMUSCULAR | Qty: 2

## 2016-07-23 ENCOUNTER — Other Ambulatory Visit: Admit: 2016-07-23 | Discharge: 2016-07-23 | Payer: MEDICARE | Primary: MD

## 2016-07-23 DIAGNOSIS — Z7901 Long term (current) use of anticoagulants: Secondary | ICD-10-CM

## 2016-07-23 LAB — PROTIME-INR
INR: 2.7 — ABNORMAL HIGH (ref 0.9–1.2)
Protime: 29.2 Seconds — ABNORMAL HIGH (ref 10.0–13.0)

## 2016-08-09 ENCOUNTER — Ambulatory Visit: Admit: 2016-08-09 | Discharge: 2016-08-09 | Payer: MEDICARE | Attending: MD | Primary: MD

## 2016-08-09 DIAGNOSIS — L989 Disorder of the skin and subcutaneous tissue, unspecified: Secondary | ICD-10-CM

## 2016-08-09 NOTE — Progress Notes (Signed)
Eduardo Khan. is a 76 y.o. male who presents with Wound Infection (left foot)       HISTORY OF PRESENT ILLNESS  Patient complains of having a wound inside of left foot for 1-2 weeks.  He first noticed blood on his sock and placed antibiotic ointment and band aid over the sore area.  A few days later it became bigger with yellowish, bloody drainage.  Wound is circular associated with swelling and redness.  Denies injury, pain.       Pleasant tall Marfanoid-appearing gentleman today with his wife who has been a long-time smoker and continues to smoke. He has known peripheral artery disease in his  LLE and has had an aortic aneurysm repair. He recently saw a vascular surgeon who decided that surgery, bypass or stenting of his leg was not needed at this time, and continued him on warfarin and aspirin.    He does not know how it developed, but he developed a painless, eroded, shallow ulcer over his left dorsal medial navicular bone about 2 weeks ago. A couple days ago he applied some Neosporin and a Band-Aid and did not otherwise address it until noticing it yesterday. He noticed the wound was covered with a white exudate and no is a red rim around it. He remains painless. He has no claudication. No fevers or chills.    Last tetanus immunization 2011.    He is on Coumadin for chronic atrial fibrillation. INR was 2.7  2 weeks ago. He is also on daily 81 mg aspirin.       Past Medical History:   Diagnosis Date   . A-fib (CMS/HCC)    . A-fib (CMS/HCC)    . Cancer (CMS/HCC)     skin on hand and face   . Chronic pain     aching low back   . COPD (chronic obstructive pulmonary disease) (CMS/HCC)    . GERD (gastroesophageal reflux disease)     controlled   . Hypertension    . PONV (postoperative nausea and vomiting)     with morphine     Past Surgical History:   Procedure Laterality Date   . BACK SURGERY      L3/4 herniated disk 2015?   Marland Kitchen EYE SURGERY      both eyes, cataracts, right eye first of March   . PROCEDURE  N/A 03/15/2016    Procedure: AORTOBILILIAC ABDOMINAL ANEUYRISM REPAIR; ENDARTERECTOMY RIGHT ILIAC, PROFUNDA  AND SUPERFICIAL FEMORAL ARTERY;  Surgeon: Gara Kroner, MD;  Location: [facility] OR;  Service: General Surgery;  Laterality: N/A;     Allergies   Allergen Reactions   . Venom-Honey Bee Anaphylaxis     Swelled lips and tongue   . Yellow Jacket Venom Shortness Of Breath and Swelling     Swelling of lips and tongues   . Codeine Nausea And Vomiting   . Morphine Nausea And Vomiting     Social History     Social History Main Topics   . Smoking status: Current Every Day Smoker     Packs/day: 1.00     Years: 60.00   . Smokeless tobacco: Never Used   . Alcohol use 8.4 oz/week     14 Cans of beer per week      Comment: occasion   . Drug use: No   . Sexual activity: Yes     Partners: Female     Social History   . Marital status: Married  Spouse name: N/A   . Number of children: N/A   . Years of education: N/A     Other Topics Concern   . Not on file     Outpatient Prescriptions Marked as Taking for the 08/09/16 encounter (Office Visit) with EXAM RM 21   Medication Sig Dispense Refill   . acetaminophen (TYLENOL) 500 MG tablet Take 1,000 mg by mouth every 6 hours as needed for Pain.     Marland Kitchen aspirin 81 MG chewable tablet Chew 1 tablet by mouth daily. 30 tablet 11   . diphenhydramine-acetaminophen (TYLENOL PM) 25-500 mg Tab Take 2 tablets by mouth every night at bedtime.     . fluticasone (FLONASE) 50 mcg/actuation nasal spray 1 spray by Nasal route Daily.     . fluticasone-salmeterol (ADVAIR) 500-50 mcg/dose DsDv Inhale 1 puff into the lungs 2 times daily.     Marland Kitchen ipratropium-albuterol (DUO-NEB) 0.5 mg-3 mg(2.5 mg base)/3 mL nebulizer solution Take 3 mLs by nebulization every 6 (six) hours as needed for Wheezing.     . MULTIVIT WITH IRON,MINERALS (MULTIVITAMIN-IRON-MINERALS ORAL) Take 1 tablet by mouth every morning.     Marland Kitchen OMEPRAZOLE ORAL Take 20 mg by mouth daily.      . OXYGEN-AIR DELIVERY SYSTEMS MISC 2 L by  Miscellaneous route every night at bedtime.     Marland Kitchen tiotropium (SPIRIVA) 18 mcg inhalation Inhale 18 mcg into the lungs daily.     Marland Kitchen warfarin (COUMADIN) 2.5 MG tablet Take 2.5 mg by mouth daily. On Sun/Tue/Thur/Sat     . warfarin (COUMADIN) 5 MG tablet Take 5 mg by mouth daily. On Mon/Wed/Fri     . [DISCONTINUED] KLOR-CON M10 10 mEq tablet Take 10 mEq by mouth daily.      . [DISCONTINUED] nadolol (CORGARD) 40 MG tablet Take 40 mg by mouth daily.           REVIEW OF SYSTEMS  Review of Systems   Constitutional: Negative for chills and fever.   Cardiovascular: Negative for chest pain and palpitations.   Skin: Negative for itching and rash.   Neurological: Negative for dizziness and headaches.       PHYSICAL EXAM  BP 130/80 (BP Location: Left arm, Patient Position: Sitting)   Pulse 57   Temp 36.9 ?C (98.5 ?F) (Oral)   Resp 16   Ht 6' 4 (1.93 m)   Wt 183 lb (83 kg)   SpO2 98%   BMI 22.28 kg/m?   Body mass index is 22.28 kg/m?Marland Kitchen    Physical Exam  Exam limited to left lower extremity. His foot is pink and warm but I cannot palpate any pulses in his dorsalis pedis or posterior tibialis. Capillary refill is normal. He has a painless, dry, mildly hyperkeratotic and shallowly eroded area perhaps 7-8 mm over his dorsal medial left navicular bone. There is a 1 cm rim of slight purplish discoloration which is not tender. There is no discharge or drainage.    DIAGNOSTIC STUDIES       ASSESSMENT & PLAN       ICD-9-CM ICD-10-CM    1. Skin erosion 709.9 L98.9    2. Peripheral artery disease (CMS/HCC) 443.9 I73.9    3. Chronic atrial fibrillation (CMS/HCC) 427.31 I48.2      This is a new problem to me.  Current medications, allergies and problem list personally reviewed in Epic today.    He has a small slow-to-heal skin erosion on his left foot which does not appear infected. I suspect  this is related to friction. He has peripheral artery disease. He will be slow to heal. I dressed his wound today with Polysporin and a Band-Aid  and encouraged him to do the same twice a day with good daily skin cleansing. He should notify his PCP and vascular surgeon of the status of his foot lesion. He is aware that slow healing or nonhealing wounds or symptom of severe peripheral artery disease and may compel some sort of vascular intervention. I encouraged him to stop smoking.      Follow-Up  Data Unavailable  Data Unavailable  Data Unavailable       Discharge Instructions      This note was transcribed using speech recognition software. Please contact us for clarification if any questions arise relating to the wording of this document.

## 2016-08-11 NOTE — Telephone Encounter (Signed)
LVM:   Calling from [facility] asking how you are feeling after being seen here on 08/09/2016? If you have any questions about your visit with [place], please call us back at [phone], otherwise we hope that you are feeling better and thank you for choosing [place] for your urgent healthcare needs. You will be receiving a survey in the mail or via email and we greatly appreciate your feedback.

## 2016-08-24 ENCOUNTER — Other Ambulatory Visit: Admit: 2016-08-24 | Discharge: 2016-08-24 | Payer: MEDICARE | Primary: MD

## 2016-08-24 DIAGNOSIS — Z7901 Long term (current) use of anticoagulants: Secondary | ICD-10-CM

## 2016-08-24 LAB — PROTIME-INR
INR: 2.5 — ABNORMAL HIGH (ref 0.9–1.2)
Protime: 27 Seconds — ABNORMAL HIGH (ref 10.0–13.0)

## 2016-09-24 ENCOUNTER — Other Ambulatory Visit: Admit: 2016-09-24 | Discharge: 2016-09-24 | Payer: MEDICARE | Primary: MD

## 2016-09-24 DIAGNOSIS — Z7901 Long term (current) use of anticoagulants: Secondary | ICD-10-CM

## 2016-09-24 LAB — PROTIME-INR
INR: 2.5 — ABNORMAL HIGH (ref 0.9–1.2)
Protime: 27.3 Seconds — ABNORMAL HIGH (ref 10.0–13.0)

## 2016-10-04 ENCOUNTER — Ambulatory Visit: Admit: 2016-10-04 | Discharge: 2016-10-04 | Payer: MEDICARE | Attending: FNP | Primary: MD

## 2016-10-04 DIAGNOSIS — L989 Disorder of the skin and subcutaneous tissue, unspecified: Secondary | ICD-10-CM

## 2016-10-04 NOTE — Patient Instructions (Signed)
Body Ringworm  Body ringworm is an infection of the skin that often causes a ring-shaped rash. Body ringworm can affect any part of your skin. It can spread easily to others. Body ringworm is also called tinea corporis.  CAUSES  This condition is caused by funguses called dermatophytes. The condition develops when these funguses grow out of control on the skin.  You can get this condition if you touch a person or animal that has it. You can also get it if you share clothing, bedding, towels, or any other object with an infected person or pet.  RISK FACTORS  This condition is more likely to develop in:   Athletes who often make skin-to-skin contact with other athletes, such as wrestlers.   People who share equipment and mats.   People with a weakened immune system.  SYMPTOMS  Symptoms of this condition include:   Itchy, raised red spots and bumps.   Red scaly patches.   A ring-shaped rash. The rash may have:    A clear center.    Scales or red bumps at its center.    Redness near its borders.    Dry and scaly skin on or around it.  DIAGNOSIS  This condition can usually be diagnosed with a skin exam. A skin scraping may be taken from the affected area and examined under a microscope to see if the fungus is present.  TREATMENT  This condition may be treated with:   An antifungal cream or ointment.   An antifungal shampoo.   Antifungal medicines. These may be prescribed if your ringworm is severe, keeps coming back, or lasts a long time.  HOME CARE INSTRUCTIONS   Take over-the-counter and prescription medicines only as told by your health care provider.   If you were given an antifungal cream or ointment:    Use it as told by your health care provider.    Wash the infected area and dry it completely before applying the cream or ointment.   If you were given an antifungal shampoo:    Use it as told by your health care provider.    Leave the shampoo on your body for 3-5 minutes before rinsing.   While you  have a rash:    Wear loose clothing to stop clothes from rubbing and irritating it.    Wash or change your bed sheets every night.   If your pet has the same infection, take your pet to see a veterinarian.  PREVENTION   Practice good hygiene.   Wear sandals or shoes in public places and showers.   Do not share personal items with others.   Avoid touching red patches of skin on other people.   Avoid touching pets that have bald spots.   If you touch an animal that has a bald spot, wash your hands.  SEEK MEDICAL CARE IF:   Your rash continues to spread after 7 days of treatment.   Your rash is not gone in 4 weeks.   The area around your rash gets red, warm, tender, and swollen.     This information is not intended to replace advice given to you by your health care provider. Make sure you discuss any questions you have with your health care provider.     Document Released: 04/02/2000 Document Revised: 07/28/2015 Document Reviewed: 01/30/2015  Elsevier Interactive Patient Education 2017 [employer]

## 2016-10-04 NOTE — Progress Notes (Signed)
Eduardo Khan. is a 76 y.o. male who presents with Rash (hip, right)  .       HISTORY OF PRESENT ILLNESS  Rash   This is a new problem. Episode onset: 1 month  Progression since onset: increased in size. The affected locations include the right hip. Associated with: red raised ruff patch, circular  Pertinent negatives include no fever or shortness of breath. Treatments tried: Antibacterial cream  The treatment provided no relief.          Past Medical History:   Diagnosis Date   . A-fib (CMS/HCC)    . A-fib (CMS/HCC)    . Cancer (CMS/HCC)     skin on hand and face   . Chronic pain     aching low back   . COPD (chronic obstructive pulmonary disease) (CMS/HCC)    . GERD (gastroesophageal reflux disease)     controlled   . Hypertension    . PONV (postoperative nausea and vomiting)     with morphine     Past Surgical History:   Procedure Laterality Date   . BACK SURGERY      L3/4 herniated disk 2015?   Marland Kitchen EYE SURGERY      both eyes, cataracts, right eye first of March   . PROCEDURE N/A 03/15/2016    Procedure: AORTOBILILIAC ABDOMINAL ANEUYRISM REPAIR; ENDARTERECTOMY RIGHT ILIAC, PROFUNDA  AND SUPERFICIAL FEMORAL ARTERY;  Surgeon: Gara Kroner, MD;  Location: [facility] OR;  Service: General Surgery;  Laterality: N/A;     Allergies   Allergen Reactions   . Venom-Honey Bee Anaphylaxis     Swelled lips and tongue   . Yellow Jacket Venom Shortness Of Breath and Swelling     Swelling of lips and tongues   . Codeine Nausea And Vomiting   . Morphine Nausea And Vomiting     Social History     Social History Main Topics   . Smoking status: Current Every Day Smoker     Packs/day: 1.00     Years: 60.00   . Smokeless tobacco: Never Used   . Alcohol use 8.4 oz/week     14 Cans of beer per week      Comment: occasion   . Drug use: No   . Sexual activity: Yes     Partners: Female     Social History   . Marital status: Married     Spouse name: N/A   . Number of children: N/A   . Years of education: N/A     Other Topics Concern     . Not on file     Outpatient Prescriptions Marked as Taking for the 10/04/16 encounter (Office Visit) with EXAM RM 22   Medication Sig Dispense Refill   . acetaminophen (TYLENOL) 500 MG tablet Take 1,000 mg by mouth every 6 hours as needed for Pain.     Marland Kitchen aspirin 81 MG chewable tablet Chew 1 tablet by mouth daily. 30 tablet 11   . diphenhydramine-acetaminophen (TYLENOL PM) 25-500 mg Tab Take 2 tablets by mouth every night at bedtime.     . fluticasone (FLONASE) 50 mcg/actuation nasal spray 1 spray by Nasal route Daily.     . fluticasone-salmeterol (ADVAIR) 500-50 mcg/dose DsDv Inhale 1 puff into the lungs 2 times daily.     Marland Kitchen ipratropium-albuterol (DUO-NEB) 0.5 mg-3 mg(2.5 mg base)/3 mL nebulizer solution Take 3 mLs by nebulization every 6 (six) hours as needed for Wheezing.     Marland Kitchen  MULTIVIT WITH IRON,MINERALS (MULTIVITAMIN-IRON-MINERALS ORAL) Take 1 tablet by mouth every morning.     . nadolol (CORGARD) 40 MG tablet      . OMEPRAZOLE ORAL Take 20 mg by mouth daily.      . OXYGEN-AIR DELIVERY SYSTEMS MISC 2 L by Miscellaneous route every night at bedtime.     Marland Kitchen tiotropium (SPIRIVA) 18 mcg inhalation Inhale 18 mcg into the lungs daily.     Marland Kitchen tiotropium (SPIRIVA) 18 mcg inhalation Inhale 18 mcg into the lungs daily.     Marland Kitchen warfarin (COUMADIN) 2.5 MG tablet Take 2.5 mg by mouth daily. On Sun/Tue/Thur/Sat     . warfarin (COUMADIN) 5 MG tablet Take 5 mg by mouth daily. On Mon/Wed/Fri           REVIEW OF SYSTEMS  Review of Systems   Constitutional: Negative for fever.   Eyes: Negative for blurred vision.   Respiratory: Negative for shortness of breath.    Cardiovascular: Negative for chest pain.   Skin: Positive for rash. Negative for itching.   Neurological: Negative for dizziness.       PHYSICAL EXAM  BP 135/80 (BP Location: Left arm, Patient Position: Sitting)   Pulse 59   Temp 36.4 ?C (97.5 ?F) (Oral)   Resp 16   Ht 6' 4 (1.93 m)   Wt 181 lb (82.1 kg)   SpO2 96%   BMI 22.03 kg/m?   Body mass index is 22.03  kg/m?Marland Kitchen    Physical Exam   Constitutional: He is oriented to person, place, and time and well-developed, well-nourished, and in no distress. No distress.   HENT:   Head: Normocephalic and atraumatic.   Eyes: Conjunctivae and EOM are normal. Pupils are equal, round, and reactive to light.   Neck: Normal range of motion. Neck supple.   Pulmonary/Chest: Effort normal. No stridor. No respiratory distress.   Musculoskeletal: Normal range of motion.   Neurological: He is alert and oriented to person, place, and time. He exhibits normal muscle tone. Gait normal. Coordination normal.   Skin: Skin is warm and dry. He is not diaphoretic.        Psychiatric: Mood, memory, affect and judgment normal.   Vitals reviewed.      DIAGNOSTIC STUDIES       ASSESSMENT & PLAN       ICD-10-CM    1. Skin lesion L98.9    2. Current smoker F17.200      This is a new problem to me.  Current medications, allergies and problem list personally reviewed in Epic today.  Suspect fungal patch. Recommend topical antifungal cream if not improving recommend biopsy    Follow-Up  Data Unavailable  In 1 week  As needed, Recheck for current diagnosis       Discharge Instructions      This note was transcribed using speech recognition software. Please contact us for clarification if any questions arise relating to the wording of this document.

## 2016-10-11 ENCOUNTER — Ambulatory Visit: Admit: 2016-10-11 | Discharge: 2016-10-11 | Payer: MEDICARE | Attending: PA-C | Primary: MD

## 2016-10-11 DIAGNOSIS — B359 Dermatophytosis, unspecified: Secondary | ICD-10-CM

## 2016-10-11 NOTE — Progress Notes (Signed)
Eduardo Khan. is a 76 y.o. male who presents with Rash (follow up from 10/04/2016 right hip)  .       HISTORY OF PRESENT ILLNESS  Patient presents for follow up onrash right hip from 6/18/208.      Rash   This is a new problem. The current episode started 1 to 4 weeks ago. The problem is unchanged. The affected locations include the right hip. The rash is characterized by redness. It is unknown if there was an exposure to a precipitant. Pertinent negatives include no congestion, cough or shortness of breath. Treatments tried: ciclopirox olamine cream. The treatment provided mild relief. There is no history of allergies or asthma.      Patient is unsure if his rash has been improving and wanted to come in for a recheck. States no new concerns.     Past Medical History:   Diagnosis Date   . A-fib (CMS/HCC)    . A-fib (CMS/HCC)    . Cancer (CMS/HCC)     skin on hand and face   . Chronic pain     aching low back   . COPD (chronic obstructive pulmonary disease) (CMS/HCC)    . GERD (gastroesophageal reflux disease)     controlled   . Hypertension    . PONV (postoperative nausea and vomiting)     with morphine     Past Surgical History:   Procedure Laterality Date   . BACK SURGERY      L3/4 herniated disk 2015?   Marland Kitchen EYE SURGERY      both eyes, cataracts, right eye first of March   . PROCEDURE N/A 03/15/2016    Procedure: AORTOBILILIAC ABDOMINAL ANEUYRISM REPAIR; ENDARTERECTOMY RIGHT ILIAC, PROFUNDA  AND SUPERFICIAL FEMORAL ARTERY;  Surgeon: Gara Kroner, MD;  Location: [facility] OR;  Service: General Surgery;  Laterality: N/A;     Allergies   Allergen Reactions   . Venom-Honey Bee Anaphylaxis     Swelled lips and tongue   . Yellow Jacket Venom Shortness Of Breath and Swelling     Swelling of lips and tongues   . Codeine Nausea And Vomiting   . Morphine Nausea And Vomiting     Social History     Social History Main Topics   . Smoking status: Current Every Day Smoker     Packs/day: 1.00     Years: 60.00   .  Smokeless tobacco: Never Used   . Alcohol use 8.4 oz/week     14 Cans of beer per week      Comment: occasion   . Drug use: No   . Sexual activity: Yes     Partners: Female     Social History   . Marital status: Married     Spouse name: N/A   . Number of children: N/A   . Years of education: N/A     Other Topics Concern   . Not on file     Outpatient Prescriptions Marked as Taking for the 10/11/16 encounter (Office Visit) with EXAM RM 21   Medication Sig Dispense Refill   . aspirin 81 MG chewable tablet Chew 1 tablet by mouth daily. 30 tablet 11   . diphenhydramine-acetaminophen (TYLENOL PM) 25-500 mg Tab Take 2 tablets by mouth every night at bedtime.     . fluticasone (FLONASE) 50 mcg/actuation nasal spray 1 spray by Nasal route Daily.     . fluticasone-salmeterol (ADVAIR) 500-50 mcg/dose DsDv Inhale 1 puff into the  lungs 2 times daily.     Marland Kitchen ipratropium-albuterol (DUO-NEB) 0.5 mg-3 mg(2.5 mg base)/3 mL nebulizer solution Take 3 mLs by nebulization every 6 (six) hours as needed for Wheezing.     . MULTIVIT WITH IRON,MINERALS (MULTIVITAMIN-IRON-MINERALS ORAL) Take 1 tablet by mouth every morning.     . nadolol (CORGARD) 40 MG tablet      . OMEPRAZOLE ORAL Take 20 mg by mouth daily.      . OXYGEN-AIR DELIVERY SYSTEMS MISC 2 L by Miscellaneous route every night at bedtime.     Marland Kitchen tiotropium (SPIRIVA) 18 mcg inhalation Inhale 18 mcg into the lungs daily.     Marland Kitchen warfarin (COUMADIN) 2.5 MG tablet Take 2.5 mg by mouth daily. On Sun/Tue/Thur/Sat     . warfarin (COUMADIN) 5 MG tablet Take 5 mg by mouth daily. On Mon/Wed/Fri           REVIEW OF SYSTEMS  Review of Systems   HENT: Negative for congestion.    Respiratory: Negative for cough and shortness of breath.    Skin: Positive for rash.       PHYSICAL EXAM  BP 140/84 (BP Location: Right arm)   Pulse 64   Temp 36.4 ?C (97.5 ?F) (Oral)   Resp 20   Ht 6' 4 (1.93 m)   Wt 181 lb (82.1 kg)   SpO2 97%   BMI 22.03 kg/m?   Body mass index is 22.03 kg/m?Marland Kitchen    Physical Exam      Constitutional: He is oriented to person, place, and time and well-developed, well-nourished, and in no distress.   HENT:   Head: Normocephalic.   Cardiovascular: Normal rate and regular rhythm.    Pulmonary/Chest: Effort normal. No respiratory distress.   Musculoskeletal: Normal range of motion.   Neurological: He is alert and oriented to person, place, and time. Gait normal.   Skin: Skin is warm and dry.   Improving tinea on the right hip that has reduced in redness. No swelling or new lesions. Healing appropriately   Psychiatric: Mood and affect normal.   Vitals reviewed.      DIAGNOSTIC STUDIES       ASSESSMENT & PLAN       ICD-10-CM    1. Tinea B35.9    2. Current smoker F17.200      This is a new problem to me.  Current medications, allergies and problem list personally reviewed in Epic today.    Appropriate healing of rash. Instructed this can take 3-4 weeks to resolve and to continue with his current medication. Instructed to return at any time with any new or concerning symptoms.     Follow-Up  Data Unavailable  Data Unavailable  Data Unavailable       Discharge Instructions      This note was transcribed using speech recognition software. Please contact us for clarification if any questions arise relating to the wording of this document.

## 2016-10-25 ENCOUNTER — Other Ambulatory Visit: Admit: 2016-10-25 | Discharge: 2016-10-25 | Payer: MEDICARE | Primary: MD

## 2016-10-25 DIAGNOSIS — Z7901 Long term (current) use of anticoagulants: Secondary | ICD-10-CM

## 2016-10-25 LAB — PROTIME-INR
INR: 1.7 — ABNORMAL HIGH (ref 0.9–1.2)
Protime: 18.2 Seconds — ABNORMAL HIGH (ref 10.0–13.0)

## 2016-11-19 ENCOUNTER — Other Ambulatory Visit: Admit: 2016-11-19 | Discharge: 2016-11-19 | Payer: MEDICARE | Primary: MD

## 2016-11-19 DIAGNOSIS — Z7901 Long term (current) use of anticoagulants: Secondary | ICD-10-CM

## 2016-11-19 LAB — PROTIME-INR
INR: 2.5 — ABNORMAL HIGH (ref 0.9–1.2)
Protime: 26.7 Seconds — ABNORMAL HIGH (ref 10.0–13.0)

## 2016-12-22 ENCOUNTER — Other Ambulatory Visit: Admit: 2016-12-22 | Discharge: 2016-12-22 | Payer: MEDICARE | Primary: MD

## 2016-12-22 DIAGNOSIS — Z7901 Long term (current) use of anticoagulants: Secondary | ICD-10-CM

## 2016-12-22 LAB — PROTIME-INR
INR: 2.2 — ABNORMAL HIGH (ref 0.9–1.2)
Protime: 24.1 Seconds — ABNORMAL HIGH (ref 10.0–13.0)

## 2017-01-19 ENCOUNTER — Other Ambulatory Visit: Admit: 2017-01-19 | Discharge: 2017-01-19 | Payer: MEDICARE | Primary: MD

## 2017-01-19 DIAGNOSIS — I4891 Unspecified atrial fibrillation: Secondary | ICD-10-CM

## 2017-01-19 LAB — PROTIME-INR
INR: 2.6 — ABNORMAL HIGH (ref 0.9–1.2)
Protime: 28.2 Seconds — ABNORMAL HIGH (ref 10.0–13.0)

## 2017-02-24 ENCOUNTER — Other Ambulatory Visit: Admit: 2017-02-24 | Discharge: 2017-02-24 | Payer: MEDICARE | Primary: MD

## 2017-02-24 DIAGNOSIS — I4891 Unspecified atrial fibrillation: Secondary | ICD-10-CM

## 2017-02-24 LAB — PROTIME-INR
INR: 3.2 — ABNORMAL HIGH (ref 0.9–1.2)
Protime: 34.4 Seconds — ABNORMAL HIGH (ref 10.0–13.0)

## 2017-03-22 ENCOUNTER — Other Ambulatory Visit: Admit: 2017-03-22 | Discharge: 2017-03-22 | Payer: MEDICARE | Primary: MD

## 2017-03-22 DIAGNOSIS — I4891 Unspecified atrial fibrillation: Secondary | ICD-10-CM

## 2017-03-22 LAB — PROTIME-INR
INR: 2.7 — ABNORMAL HIGH (ref 0.9–1.2)
Protime: 29.1 Seconds — ABNORMAL HIGH (ref 10.0–13.0)

## 2017-04-20 ENCOUNTER — Other Ambulatory Visit: Admit: 2017-04-20 | Discharge: 2017-04-20 | Payer: MEDICARE | Primary: MD

## 2017-04-20 DIAGNOSIS — I4891 Unspecified atrial fibrillation: Secondary | ICD-10-CM

## 2017-04-22 ENCOUNTER — Other Ambulatory Visit: Admit: 2017-04-22 | Discharge: 2017-04-22 | Payer: MEDICARE | Primary: MD

## 2017-04-22 DIAGNOSIS — I4891 Unspecified atrial fibrillation: Secondary | ICD-10-CM

## 2017-04-22 LAB — PROTIME-INR
INR: 2.6 — ABNORMAL HIGH (ref 0.9–1.2)
Protime: 28.7 Seconds — ABNORMAL HIGH (ref 10.0–13.0)

## 2017-05-23 ENCOUNTER — Other Ambulatory Visit: Admit: 2017-05-23 | Discharge: 2017-05-23 | Payer: MEDICARE | Primary: MD

## 2017-05-23 DIAGNOSIS — I10 Essential (primary) hypertension: Secondary | ICD-10-CM

## 2017-05-23 LAB — CBC WITH AUTO DIFFERENTIAL
Basophils %: 0 % (ref 0–2)
Basophils, Absolute: 0 10*3/ÂµL (ref 0.0–0.2)
Eosinophils %: 1 % (ref 0–7)
Eosinophils, Absolute: 0.1 10*3/ÂµL (ref 0.0–0.7)
HCT: 52.4 % (ref 42.0–54.0)
Hemoglobin: 17.7 g/dL (ref 12.0–18.0)
Lymphocytes %: 20 % — ABNORMAL LOW (ref 25–45)
Lymphocytes, Absolute: 1.4 10*3/ÂµL (ref 1.1–4.3)
MCH: 33.5 pg (ref 27.0–34.0)
MCHC: 33.8 g/dL (ref 32.0–36.0)
MCV: 99.1 fL — ABNORMAL HIGH (ref 81.0–99.0)
MPV: 8.4 fL (ref 7.4–10.4)
Monocytes %: 6 % (ref 0–12)
Monocytes, Absolute: 0.4 10*3/ÂµL (ref 0.0–1.2)
Neutrophils %: 73 % — ABNORMAL HIGH (ref 35–70)
Neutrophils, Absolute: 5.1 10*3/ÂµL (ref 1.6–7.3)
Platelet Count: 112 10*3/ÂµL — ABNORMAL LOW (ref 150–400)
RBC: 5.29 10*6/ÂµL (ref 4.70–6.10)
RDW: 14.6 % — ABNORMAL HIGH (ref 11.5–14.5)
WBC: 7.1 10*3/ÂµL (ref 4.8–10.8)

## 2017-05-23 LAB — COMPREHENSIVE METABOLIC PANEL
ALT - Alanine Aminotransferase: 17 IU/L (ref 7–52)
AST - Aspartate Aminotransferase: 19 IU/L (ref 10–50)
Albumin/Globulin Ratio: 1.9 (ref >0.9)
Albumin: 4.4 g/dL (ref 3.5–5.0)
Alkaline Phosphatase: 104 IU/L (ref 34–104)
Anion Gap: 8 mmol/L (ref 4–13)
BUN: 23 mg/dL (ref 6–23)
Bilirubin Total: 1.2 mg/dL (ref 0.3–1.2)
CO2 - Carbon Dioxide: 30 mmol/L (ref 21–31)
Calcium: 9.5 mg/dL (ref 8.6–10.3)
Chloride: 109 mmol/L (ref 98–111)
Creatinine: 0.84 mg/dL (ref 0.65–1.30)
GFR Estimate: >60 mL/min/{1.73_m2} (ref >=60)
Globulin: 2.3 g/dL (ref 2.2–3.7)
Glucose: 139 mg/dL — ABNORMAL HIGH (ref 80–99)
Potassium: 3.9 mmol/L (ref 3.5–5.1)
Protein Total: 6.7 g/dL (ref 6.0–8.0)
Sodium: 147 mmol/L — ABNORMAL HIGH (ref 135–143)

## 2017-05-23 LAB — PROTIME-INR
INR: 2.8 — ABNORMAL HIGH (ref 0.9–1.2)
Protime: 30 Seconds — ABNORMAL HIGH (ref 10.0–13.0)

## 2017-08-19 ENCOUNTER — Emergency Department (HOSPITAL_COMMUNITY)
Admission: EM | Admit: 2017-08-19 | Discharge: 2017-08-19 | Disposition: A | Discharge status: Home / Self Care | Payer: Self-pay | Attending: Emergency Medicine | Admitting: Emergency Medicine

## 2017-08-19 ENCOUNTER — Emergency Department (HOSPITAL_COMMUNITY): Payer: Self-pay

## 2017-08-19 ENCOUNTER — Encounter (HOSPITAL_COMMUNITY): Payer: Self-pay

## 2017-08-19 ENCOUNTER — Other Ambulatory Visit: Payer: Self-pay

## 2017-08-19 DIAGNOSIS — R000 Tachycardia, unspecified: Secondary | ICD-10-CM

## 2017-08-19 DIAGNOSIS — J181 Lobar pneumonia, unspecified organism: Secondary | ICD-10-CM | POA: Insufficient documentation

## 2017-08-19 DIAGNOSIS — J189 Pneumonia, unspecified organism: Secondary | ICD-10-CM

## 2017-08-19 DIAGNOSIS — J441 Chronic obstructive pulmonary disease with (acute) exacerbation: Secondary | ICD-10-CM

## 2017-08-19 DIAGNOSIS — R0602 Shortness of breath: Secondary | ICD-10-CM

## 2017-08-19 DIAGNOSIS — J440 Chronic obstructive pulmonary disease with acute lower respiratory infection: Secondary | ICD-10-CM | POA: Insufficient documentation

## 2017-08-19 HISTORY — DX: Chronic obstructive pulmonary disease, unspecified: J44.9

## 2017-08-19 HISTORY — DX: Unspecified hearing loss, unspecified ear: H91.90

## 2017-08-19 MED ORDER — LEVOFLOXACIN IN D5W 750 MG/150ML IV SOLN
750.0000 mg | Freq: Once | INTRAVENOUS | Status: AC
  Administered 2017-08-19: 750 mg via INTRAVENOUS
  Filled 2017-08-19: qty 150

## 2017-08-19 MED ORDER — IPRATROPIUM BROMIDE 0.02 % IN SOLN
0.5000 mg | Freq: Once | RESPIRATORY_TRACT | Status: AC
  Administered 2017-08-19: 0.5 mg via RESPIRATORY_TRACT
  Filled 2017-08-19: qty 2.5

## 2017-08-19 MED ORDER — LEVOFLOXACIN 750 MG PO TABS: 750.0000 mg | ORAL_TABLET | Freq: Every day | ORAL | 0 refills | Status: DC

## 2017-08-19 MED ORDER — ALBUTEROL SULFATE (2.5 MG/3ML) 0.083% IN NEBU
5.0000 mg | INHALATION_SOLUTION | Freq: Once | RESPIRATORY_TRACT | Status: AC
  Administered 2017-08-19: 5 mg via RESPIRATORY_TRACT
  Filled 2017-08-19: qty 6

## 2017-08-19 MED ORDER — PREDNISONE 20 MG PO TABS
60.0000 mg | ORAL_TABLET | Freq: Once | ORAL | Status: AC
  Administered 2017-08-19: 60 mg via ORAL
  Filled 2017-08-19: qty 3

## 2017-08-19 MED ORDER — PREDNISONE 20 MG PO TABS: ORAL_TABLET | ORAL | 0 refills | Status: DC

## 2017-08-19 NOTE — ED Provider Notes (Cosign Needed)
[facility]-EMERGENCY DEPT Provider Note   CSN: [id number] Arrival date & time: 08/19/17  1727     History   Chief Complaint Chief Complaint  Patient presents with  . Shortness of Breath    HPI Maurice Garrison is a 77 y.o. male with a PMHx of COPD, who presents to the ED with complaints of gradually worsening cough for the last 7 to 8 days which is productive of yellowish-greenish and white sputum, with associated shortness of breath, wheezing, and upper back pain around his rib cage.  He has been using his inhalers with some relief of his symptoms, and his symptoms seem to be aggravated when he is around dust particles at work (he builds furniture for living).  He states that he was seen by his Pulmonologist Dr. Felipe Drone at [facility] 2wks ago and given a Zpak for PNA, but he doesn't feel like it did much for him. He came here today because he wants another antibiotic.  He is a former smoker who quit 43 years ago. He denies other medical problems aside from COPD, denies having HTN/HLD/DM2/Afib, etc.   He denies fevers, chills, CP, hemoptysis, LE swelling, recent travel/surgery/immobilization, personal/family hx of DVT/PE, abd pain, N/V/D/C, hematuria, dysuria, myalgias, arthralgias, claudication, orthopnea, numbness, tingling, focal weakness, or any other complaints at this time. No known FHx of cardiac disease.   The history is provided by the patient and medical records. No language interpreter was used.  Shortness of Breath  Associated symptoms include cough and wheezing. Pertinent negatives include no fever, no chest pain, no vomiting, no abdominal pain and no leg swelling.    Past Medical History:  Diagnosis Date  . COPD (chronic obstructive pulmonary disease) (HCC)   . HOH (hard of hearing)     There are no active problems to display for this patient.   Past Surgical History:  Procedure Laterality Date  . APPENDECTOMY    . HERNIA REPAIR           Home Medications    Prior to Admission medications   Not on File    Family History History reviewed. No pertinent family history.  Social History Social History   Tobacco Use  . Smoking status: Former Games developer  . Smokeless tobacco: Never Used  Substance Use Topics  . Alcohol use: Never    Frequency: Never  . Drug use: Never     Allergies   Patient has no known allergies.   Review of Systems Review of Systems  Constitutional: Negative for chills and fever.  Respiratory: Positive for cough, shortness of breath and wheezing.   Cardiovascular: Negative for chest pain and leg swelling.  Gastrointestinal: Negative for abdominal pain, constipation, diarrhea, nausea and vomiting.  Genitourinary: Negative for dysuria and hematuria.  Musculoskeletal: Positive for back pain. Negative for arthralgias and myalgias.  Skin: Negative for color change.  Allergic/Immunologic: Negative for immunocompromised state.  Neurological: Negative for weakness and numbness.  Psychiatric/Behavioral: Negative for confusion.   All other systems reviewed and are negative for acute change except as noted in the HPI.    Physical Exam Updated Vital Signs BP 139/72 (BP Location: Right Arm)   Pulse (!) 115   Temp 99.1 F (37.3 C) (Oral)   Resp 20   Ht 5' 7.5" (1.715 m)   Wt 54 kg (119 lb)   SpO2 100%   BMI 18.36 kg/m   Physical Exam  Constitutional: He is oriented to person, place, and time. Vital signs are normal.  He appears well-developed and well-nourished.  Non-toxic appearance. No distress.  Afebrile, nontoxic, NAD  HENT:  Head: Normocephalic and atraumatic.  Mouth/Throat: Oropharynx is clear and moist and mucous membranes are normal.  Eyes: Conjunctivae and EOM are normal. Right eye exhibits no discharge. Left eye exhibits no discharge.  Neck: Normal range of motion. Neck supple.  Cardiovascular: Normal heart sounds and intact distal pulses. An irregularly irregular rhythm  present. Tachycardia present. Exam reveals no gallop and no friction rub.  No murmur heard. Somewhat irregularly irregular rhythm at times, tachycardic in the 110s, nl s1/s2, no m/r/g, distal pulses intact, no pedal edema   Pulmonary/Chest: Effort normal. No respiratory distress. He has no decreased breath sounds. He has wheezes. He has rhonchi. He has no rales.  Scattered rhonchi throughout all lung fields but most notably in the lower fields bilaterally, faint expiratory wheezing throughout, no rales appreciated, no hypoxia or significantly increased WOB, speaking in mostly full sentences, SpO2 100% on RA   Abdominal: Soft. Normal appearance and bowel sounds are normal. He exhibits no distension. There is no tenderness. There is no rigidity, no rebound, no guarding, no CVA tenderness, no tenderness at McBurney's point and negative Murphy's sign.  Musculoskeletal: Normal range of motion.  Neurological: He is alert and oriented to person, place, and time. He has normal strength. No sensory deficit.  Skin: Skin is warm, dry and intact. No rash noted.  Psychiatric: He has a normal mood and affect.  Nursing note and vitals reviewed.    ED Treatments / Results  Labs (all labs ordered are listed, but only abnormal results are displayed) Labs Reviewed - No data to display  EKG EKG Interpretation  Date/Time:  Friday Aug 19 2017 20:34:35 EDT Ventricular Rate:  107 PR Interval:    QRS Duration: 92 QT Interval:  329 QTC Calculation: 439 R Axis:   61 Text Interpretation:  Sinus or ectopic atrial tachycardia Atrial premature complex Confirmed by Rolan Bucco [phone]) on 08/19/2017 8:38:17 PM  EKG Interpretation #1: Date/Time:  Friday Aug 19 2017 17:31:05 EDT Ventricular Rate:  116 PR Interval:    QRS Duration: 86 QT Interval:  329 QTC Calculation: 457 R Axis:   61 Text Interpretation:  Sinus tachycardia with irregular rate probable a-fib Confirmed by Rolan Bucco [phone]) on 08/19/2017  7:27:39 PM   Radiology Dg Chest 2 View  Result Date: 08/19/2017 CLINICAL DATA:  Left lower lobe airspace disease. EXAM: CHEST - 2 VIEW COMPARISON:  Productive cough.  Unresolved shortness of breath. FINDINGS: Heart size is normal. Emphysematous changes are again noted. Aortic atherosclerosis is present. Elevation of left hemidiaphragm is stable. Left lower lobe airspace disease remains. Exaggerated thoracic kyphosis is noted. IMPRESSION: 1. Suspected residual left lower lobe airspace opacity. Recommend CT without contrast for further evaluation possible residual airspace disease/malignancy. Electronically Signed   By: Marin Roberts M.D.   On: 08/19/2017 18:18    CT chest w/o contrast 06/30/17: IMPRESSION: 1. New airspace consolidation within the left lower lobe suspicious for pneumonia. Followup PA and lateral chest xray is recommended in 3-4 weeks following trial of antibiotic therapy to ensure resolution and exclude underlying malignancy. 2.  Diffuse bronchial wall thickening with advanced emphysema, as above; imaging findings suggestive of underlying COPD.  Emphysema 3.  Bilateral areas of scarring and bronchiectasis is favored to represent sequelae of chronic recurrent inflammation/infection.  Chronic aspiration not excluded. 4.  Prior granuloma issues disease 5.  Stable enlarged right paratracheal lymph node measuring 1.3 cm.  Electronically  signed By: Signa Kell M.D. On: 06/30/2017 17:05   Procedures Procedures (including critical care time)  Medications Ordered in ED Medications  levofloxacin (LEVAQUIN) IVPB 750 mg (750 mg Intravenous New Bag/Given 08/19/17 2050)  albuterol (PROVENTIL) (2.5 MG/3ML) 0.083% nebulizer solution 5 mg (5 mg Nebulization Given 08/19/17 2055)  ipratropium (ATROVENT) nebulizer solution 0.5 mg (0.5 mg Nebulization Given 08/19/17 2054)  predniSONE (DELTASONE) tablet 60 mg (60 mg Oral Given 08/19/17 2202)     Initial Impression / Assessment and Plan / ED  Course  I have reviewed the triage vital signs and the nursing notes.  Pertinent labs & imaging results that were available during my care of the patient were reviewed by me and considered in my medical decision making (see chart for details).     77 y.o. male here with cough and SOB x7-8 days, was treated for PNA 2wks ago with zpak but never improved. On exam, scattered rhonchi throughout, faint expiratory wheezing, SpO2 100% on RA, no hypoxia or significant increased WOB, no pedal edema, tachycardic in the 110s. EKG with somewhat irregularly irregular rhythm and tachycardia, looks like probable Afib but not clear; will repeat EKG now. CXR showing residual LLL airspace opacity, recommending CT chest w/o contrast for further evaluation. When this was discussed with pt, he states he's had a CT chest recently (review of PACS system shows CT chest w/o contrast done on 06/30/17 which showed new airspace consolidation in LLL suspicious for PNA, recommended f/up CXR after abx to exclude underlying malignancy; also showed diffuse bronchial wall thickening with advanced emphysema and b/l scarring and bronchiectasis). Pt is adamant that he does not want repeat CT, and just wants abx and to go home. Does not want to do labs or any further imaging, despite discussion that he may have underlying mass/malignancy vs PE that could be causing his symptoms. Discussed with my attending Dr. Fredderick Phenix who will go speak with the pt and see if we can get him to stay and cooperate with further evaluation. Will monitor and reassess shortly.   8:31 PM Dr. Fredderick Phenix saw pt and he is agreeable to getting a dose of IV levaquin and neb treatment but otherwise he does not want to have any further testing/etc. He refuses admission. Will reassess after neb tx.   10:15 PM Repeat EKG shows sinus tachycardia, no longer looks like Afib. Pt's lung sounds greatly improved after duoneb/meds, no further wheezing and rhonchi is also improved. Pt again  was explained the risks of leaving and benefits of staying for further evaluation and possibly admission, and he continues to decline wanting anything further, wants to leave AGAINST MEDICAL ADVICE. Will send home with levaquin, prednisone burst, and advised use of home inhalers as well as other OTC remedies for symptomatic relief. Advised close PCP/pulmonology f/up in 3-5 days for recheck. I explained the diagnosis and have given explicit precautions to return to the ER including for any other new or worsening symptoms. The patient understands and accepts the medical plan as it's been dictated and I have answered their questions. Discharge instructions concerning home care and prescriptions have been given. The patient is discharged to home in AGAINST MEDICAL ADVICE.    Final Clinical Impressions(s) / ED Diagnoses   Final diagnoses:  Community acquired pneumonia of left lower lobe of lung (HCC)  SOB (shortness of breath)  Tachycardia  COPD exacerbation [facility])    ED Discharge Orders        Ordered    predniSONE (DELTASONE) 20 MG  tablet     08/19/17 2211    levofloxacin (LEVAQUIN) 750 MG tablet  Daily     08/19/17 [address], [place], [place] 08/19/17 2216

## 2017-08-19 NOTE — ED Triage Notes (Signed)
Patient c/o SOB with exertion and a productive cough with white sputum since yesterday.

## 2017-08-19 NOTE — Discharge Instructions (Addendum)
You are leaving against medical advice, which means you understand that your work up was not completed here today and that this means you're risking further deterioration and even death by leaving.  You appear to have pneumonia still. Take the antibiotic as directed until completed, starting tomorrow since you received today's dose here in the ER tonight. Use home inhalers as directed, as needed for cough/chest congestion/wheezing/shortness of breath. Take prednisone as directed until completed, starting tomorrow since you were given today's dose here. Continue to stay well-hydrated. Continue to alternate between Tylenol and Ibuprofen for pain or fever. Use Mucinex for cough suppression/expectoration of mucus. Use netipot and flonase to help with nasal congestion. May consider over-the-counter Benadryl or other antihistamine to decrease secretions and for help with your symptoms. Follow up with your primary care doctor or pulmonologist in 3-5 days for recheck of ongoing symptoms. Return to emergency department for emergent changing or worsening of symptoms.

## 2017-09-06 ENCOUNTER — Other Ambulatory Visit: Admit: 2017-09-06 | Discharge: 2017-09-06 | Payer: MEDICARE | Primary: MD

## 2017-09-06 DIAGNOSIS — I714 Abdominal aortic aneurysm, without rupture: Secondary | ICD-10-CM

## 2017-09-06 LAB — BUN CREATININE
BUN: 19 mg/dL (ref 6–23)
Creatinine: 0.84 mg/dL (ref 0.65–1.30)
GFR Estimate Male: >60 mL/min/{1.73_m2} (ref >=60)

## 2017-12-12 ENCOUNTER — Other Ambulatory Visit: Admit: 2017-12-12 | Discharge: 2017-12-12 | Payer: MEDICARE | Primary: MD

## 2017-12-12 ENCOUNTER — Inpatient Hospital Stay: Admit: 2017-12-12 | Payer: MEDICARE | Attending: MD | Primary: MD

## 2017-12-12 DIAGNOSIS — Y999 Unspecified external cause status: Secondary | ICD-10-CM

## 2017-12-12 DIAGNOSIS — J449 Chronic obstructive pulmonary disease, unspecified: Secondary | ICD-10-CM

## 2017-12-12 LAB — COMPREHENSIVE METABOLIC PANEL
ALT - Alanine Aminotransferase: 15 IU/L (ref 7–52)
AST - Aspartate Aminotransferase: 18 IU/L (ref 10–50)
Albumin/Globulin Ratio: 1.8 (ref >0.9)
Albumin: 3.9 g/dL (ref 3.5–5.0)
Alkaline Phosphatase: 104 IU/L (ref 34–104)
Anion Gap: 9 mmol/L (ref 4–13)
BUN: 15 mg/dL (ref 6–23)
Bilirubin Total: 1.3 mg/dL — ABNORMAL HIGH (ref 0.3–1.2)
CO2 - Carbon Dioxide: 28 mmol/L (ref 21–31)
Calcium: 9.2 mg/dL (ref 8.6–10.3)
Chloride: 106 mmol/L (ref 98–111)
Creatinine: 0.75 mg/dL (ref 0.65–1.30)
GFR Estimate Male: >60 mL/min/{1.73_m2} (ref >=60)
Globulin: 2.2 g/dL (ref 2.2–3.7)
Glucose: 65 mg/dL — ABNORMAL LOW (ref 80–99)
Potassium: 3.5 mmol/L (ref 3.5–5.1)
Protein Total: 6.1 g/dL (ref 6.0–8.0)
Sodium: 143 mmol/L (ref 135–143)

## 2017-12-12 LAB — CBC WITHOUT DIFFERENTIAL
HCT: 49.1 % (ref 42.0–54.0)
Hemoglobin: 16.9 g/dL (ref 12.0–18.0)
MCH: 34.1 pg — ABNORMAL HIGH (ref 27.0–34.0)
MCHC: 34.3 g/dL (ref 32.0–36.0)
MCV: 99.2 fL — ABNORMAL HIGH (ref 81.0–99.0)
MPV: 8.8 fL (ref 7.4–10.4)
Platelet Count: 109 10*3/ÂµL — ABNORMAL LOW (ref 150–400)
RBC: 4.95 10*6/ÂµL (ref 4.70–6.10)
RDW: 14.1 % (ref 11.5–14.5)
WBC: 8.1 10*3/ÂµL (ref 4.8–10.8)

## 2018-06-11 ENCOUNTER — Other Ambulatory Visit
Admission: RE | Admit: 2018-06-11 | Discharge: 2018-06-11 | Disposition: A | Discharge status: Home / Self Care | Payer: Medicare Other | Attending: Infectious Diseases | Admitting: Infectious Diseases

## 2018-06-11 DIAGNOSIS — Z029 Encounter for administrative examinations, unspecified: Secondary | ICD-10-CM | POA: Insufficient documentation

## 2018-06-11 LAB — BASIC METABOLIC PANEL
Anion gap: 12 (ref 5–15)
BUN: 14 mg/dL (ref 8–23)
CO2: 28 mmol/L (ref 22–32)
Calcium: 8.4 mg/dL — ABNORMAL LOW (ref 8.9–10.3)
Chloride: 98 mmol/L (ref 98–111)
Creatinine, Ser: 0.5 mg/dL — ABNORMAL LOW (ref 0.61–1.24)
GFR calc Af Amer: >60 mL/min (ref >60)
GFR calc non Af Amer: >60 mL/min (ref >60)
GLUCOSE: 101 mg/dL — AB (ref 70–99)
POTASSIUM: 3.4 mmol/L — AB (ref 3.5–5.1)
Sodium: 138 mmol/L (ref 135–145)

## 2018-06-11 LAB — CBC WITH DIFFERENTIAL/PLATELET
ABS IMMATURE GRANULOCYTES: 0.06 10*3/uL (ref 0.00–0.07)
Basophils Absolute: 0.1 10*3/uL (ref 0.0–0.1)
Basophils Relative: 1 %
Eosinophils Absolute: 1.1 10*3/uL — ABNORMAL HIGH (ref 0.0–0.5)
Eosinophils Relative: 7 %
HEMATOCRIT: 37.8 % — AB (ref 39.0–52.0)
HEMOGLOBIN: 12.1 g/dL — AB (ref 13.0–17.0)
IMMATURE GRANULOCYTES: 0 %
LYMPHS ABS: 1.7 10*3/uL (ref 0.7–4.0)
Lymphocytes Relative: 11 %
MCH: 28.7 pg (ref 26.0–34.0)
MCHC: 32 g/dL (ref 30.0–36.0)
MCV: 89.6 fL (ref 80.0–100.0)
MONO ABS: 1.5 10*3/uL — AB (ref 0.1–1.0)
MONOS PCT: 10 %
NEUTROS ABS: 11.5 10*3/uL — AB (ref 1.7–7.7)
NEUTROS PCT: 71 %
PLATELETS: 286 10*3/uL (ref 150–400)
RBC: 4.22 MIL/uL (ref 4.22–5.81)
RDW: 14.5 % (ref 11.5–15.5)
WBC: 16.1 10*3/uL — ABNORMAL HIGH (ref 4.0–10.5)
nRBC: 0 % (ref 0.0–0.2)

## 2018-11-18 DEATH — deceased

## 2018-11-20 ENCOUNTER — Telehealth (INDEPENDENT_AMBULATORY_CARE_PROVIDER_SITE_OTHER): Payer: Medicare Other | Admitting: Internal Medicine

## 2018-11-20 ENCOUNTER — Encounter: Payer: Self-pay | Admitting: Internal Medicine

## 2018-11-20 VITALS — Ht 67.0 in | Wt 109.0 lb

## 2018-11-20 DIAGNOSIS — R609 Edema, unspecified: Secondary | ICD-10-CM

## 2018-11-20 DIAGNOSIS — R0602 Shortness of breath: Secondary | ICD-10-CM | POA: Diagnosis not present

## 2018-11-20 NOTE — Patient Instructions (Addendum)
Medication Instructions:  Dr Margaretann Loveless recommends that you continue on your current medications as directed. Please refer to the Current Medication list given to you today.  If you need a refill on your cardiac medications before your next appointment, please call your pharmacy.   Testing/Procedures: 1. Echocardiogram - Your physician has requested that you have an echocardiogram. Echocardiography is a painless test that uses sound waves to create images of your heart. It provides your doctor with information about the size and shape of your heart and how well your heart's chambers and valves are working. This procedure takes approximately one hour. There are no restrictions for this procedure.  >> This will be performed at our [facility] location [place], [place] [address] [phone]  Follow-Up: An appointment has been scheduled for Wednesday, 11/22/18, at 2:15pm.

## 2018-11-20 NOTE — Progress Notes (Signed)
Virtual Visit via Video Note   This visit type was conducted due to national recommendations for restrictions regarding the COVID-19 Pandemic (e.g. social distancing) in an effort to limit this patient's exposure and mitigate transmission in our community.  Due to his co-morbid illnesses, this patient is at least at moderate risk for complications without adequate follow up.  This format is felt to be most appropriate for this patient at this time.  All issues noted in this document were discussed and addressed.  A limited physical exam was performed with this format.  Please refer to the patient's chart for his consent to telehealth for [facility].   Date:  11/20/2018   ID:  Maurice Garrison, DOB 04-Aug-1940, MRN [id number]  Patient Location: Home Provider Location: Home  PCP:  Patient, No Pcp Per  Cardiologist:  No primary care provider on file.  Electrophysiologist:  None   Evaluation Performed:  New Patient Evaluation  Chief Complaint: Recent acute respiratory failure, query heart failure.  History of Present Illness:    CHISTOPHER Garrison is a 78 y.o. male with past medical history of COPD requiring oxygen, pulmonary fibrosis, bronchiectasis, Aspergillus infection, and a recent episode of acute respiratory failure who presents today at the request of his daughter for evaluation of heart failure.  Ms. Maurice Garrison is also my patient, and is [name] daughter.  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  He was recently hospitalized in [facility] for acute respiratory failure.  His daughter feels that there were conversations involving an enlarged heart and heart failure, but she does not recall a work-up for heart failure being performed and is very concerned that there may be missing information about his heart.  For this reason she wanted him evaluated in cardiovascular clinic.  His oxygen demand  has increased from 2 to 3 L.  He has been placed on Lasix 40 mg daily and has felt slightly better since Thursday.  For this reason his daughter is concerned that he may have heart failure and need continuing therapy.  I have tried to question and interact with the patient, however the patient appears quite frustrated, and does not feel that he needs further work-up.  His daughter shares with me that she is very concerned that his health is quite different than his usual.  Patient denies active chest pain, however does appear visibly dyspneic sitting in the chair on oxygen therapy.  No report of palpitations, he is endorsing some leg swelling.  He is having difficulty lying flat due to shortness of breath.  No recent syncope per family report.  The patient does not have symptoms concerning for COVID-19 infection (fever, chills, cough, or new shortness of breath).    Past Medical History:  Diagnosis Date   COPD (chronic obstructive pulmonary disease) (HCC)    HOH (hard of hearing)    Past Surgical History:  Procedure Laterality Date   APPENDECTOMY     HERNIA REPAIR       Current Meds  Medication Sig   ferrous sulfate 325 (65 FE) MG tablet Take 325 mg by mouth daily with breakfast.   fluticasone furoate-vilanterol (BREO ELLIPTA) 100-25 MCG/INH AEPB Inhale 1 puff into the lungs daily.   Fluticasone-Umeclidin-Vilant (TRELEGY ELLIPTA) 100-62.5-25 MCG/INH AEPB Inhale 1 puff into the lungs daily.   furosemide (LASIX) 20 MG tablet Take 2 tablets by mouth daily.  OXYGEN Inhale into the lungs. 4 liters   pantoprazole (PROTONIX) 40 MG tablet Take 40 mg by mouth 2 (two) times daily.   potassium chloride (K-DUR) 10 MEQ tablet Take 1 tablet by mouth daily.   predniSONE (DELTASONE) 20 MG tablet 3 tabs po daily x 4 days STARTING 08/20/17   umeclidinium bromide (INCRUSE ELLIPTA) 62.5 MCG/INH AEPB Inhale 1 puff into the lungs daily.     Allergies:   Mirtazapine   Social History   Tobacco  Use   Smoking status: Former Smoker   Smokeless tobacco: Never Used  Substance Use Topics   Alcohol use: Never    Frequency: Never   Drug use: Never     Family Hx: The patient's family history is not on file.  ROS:   Please see the history of present illness.     All other systems reviewed and are negative.   Prior CV studies:   The following studies were reviewed today:    Labs/Other Tests and Data Reviewed:    EKG:  No ECG reviewed.  Recent Labs: 06/11/2018: BUN 14; Creatinine, Ser 0.50; Hemoglobin 12.1; Platelets 286; Potassium 3.4; Sodium 138   Recent Lipid Panel No results found for: CHOL, TRIG, HDL, CHOLHDL, LDLCALC, LDLDIRECT  Wt Readings from Last 3 Encounters:  11/20/18 109 lb (49.4 kg)  08/19/17 119 lb (54 kg)     Objective:    Vital Signs:  Ht 5\' 7"  (1.702 m)    Wt 109 lb (49.4 kg)    BMI 17.07 kg/m    VITAL SIGNS:  reviewed GEN:  Appears visibly dyspneic, and anxious. EYES:  sclerae anicteric, EOMI - Extraocular Movements Intact RESPIRATORY:  Increased respiratory effort, accessory muscle use. CARDIOVASCULAR:  lower extremity edema noted SKIN:  no rash, lesions or ulcers. MUSCULOSKELETAL:  no obvious deformities. NEURO:  alert and oriented x 3, no obvious focal deficit PSYCH:  Anxious and frustrated, appears exasperated    ASSESSMENT & PLAN:    1. Shortness of breath   2. Edema, unspecified type    Shortness of breath at rest-very concerned about the patient's presentation, and his daughter shares with me that she feels he is doing quite poorly as compared to his baseline.  She feels that in the recent past he has not been able to interact with his family or his grandchildren as well as he would previously and is not able to be active in his business.  On my limited physical exam by a virtual visit, I am extremely concerned about his level of dyspnea, accessory muscle use, and that he appears somewhat cachectic.  I believe that he needs an  echocardiogram quite quickly to assess cardiovascular function, and more importantly needs an in office visit for an electrocardiogram as well as assessment of volume status.  This will help us understand what contribution heart failure may be playing in his multifactorial, complex dyspnea with underlying lung disease.  We will continue Lasix at this time.  I will plan for the patient to see a colleague in office in 1 to 2 days.  COVID-19 Education: The signs and symptoms of COVID-19 were discussed with the patient and how to seek care for testing (follow up with PCP or arrange E-visit).  The importance of social distancing was discussed today.  Time:   Today, I have spent 45 minutes with the patient with telehealth technology discussing the above problems.     Medication Adjustments/Labs and Tests Ordered: Current medicines are reviewed at length with  the patient today.  Concerns regarding medicines are outlined above.   Tests Ordered: No orders of the defined types were placed in this encounter.   Medication Changes: No orders of the defined types were placed in this encounter.   Follow Up: Close in office follow-up  Signed, Parke PoissonGayatri A Toy Eisemann, MD  11/20/2018 2:43 PM    [facility]

## 2018-11-21 NOTE — Progress Notes (Signed)
Cardiology Office Note   Date:  11/22/2018   ID:  Briscoe BurnsOrville R Garrison, DOB 05/28/1940, MRN [id number]  PCP:  Laqueta DueFurr, Sara M., MD  Cardiologist: Dr. Jacques NavyAcharya  CC: Dyspnea   History of Present Illness: Maurice FeyOrville R Garrison is a 78 y.o. male who presents for follow up due to dyspnea and fluid retention. Was seen by Dr. Jacques NavyAcharya on 11/20/2018 via telemedicine and appeared to be having some ongoing breathing difficulty. He has a history of COPD, former smoker.   He was treated for pneumonia by ED on 08/20/2018. 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 He was treated with prednisone and levofloxacin 750 mg daily after one IV dose. Patient left AMA. No labs or COVID testing was completed at that time.   He has been placed on lasix 40 mg daily with improvement in symptoms when seen by Dr. Jacques NavyAcharya, but continued to have difficult respirations.   He comes today with dyspnea. His home concentrator is not working correctly and therefore we placed him on O2 via [place] at 3 liters.  His daughter states that he is not eating well, continues to lose weight. She reports that his legs are looking better.  He endorses lack of appetite, feels nauseated when he tries to eat. Not drinking very much because he feels he may vomit.  He is unsteady on his feet and feels weak.    Past Medical History:  Diagnosis Date  . COPD (chronic obstructive pulmonary disease) (HCC)   . HOH (hard of hearing)     Past Surgical History:  Procedure Laterality Date  . APPENDECTOMY    . HERNIA REPAIR       Current Outpatient Medications  Medication Sig Dispense Refill  . ferrous sulfate 325 (65 FE) MG tablet Take 325 mg by mouth daily with breakfast.    . fluticasone furoate-vilanterol (BREO ELLIPTA) 100-25 MCG/INH AEPB Inhale 1 puff into the lungs daily.    . Fluticasone-Umeclidin-Vilant (TRELEGY ELLIPTA) 100-62.5-25 MCG/INH AEPB Inhale 1 puff  into the lungs daily.    . furosemide (LASIX) 20 MG tablet Take 2 tablets by mouth daily.    . OXYGEN Inhale into the lungs. 4 liters    . pantoprazole (PROTONIX) 40 MG tablet Take 40 mg by mouth 2 (two) times daily.    . potassium chloride (K-DUR) 10 MEQ tablet Take 1 tablet by mouth daily.    . predniSONE (DELTASONE) 20 MG tablet 3 tabs po daily x 4 days STARTING 08/20/17 12 tablet 0  . umeclidinium bromide (INCRUSE ELLIPTA) 62.5 MCG/INH AEPB Inhale 1 puff into the lungs daily.     No current facility-administered medications for this visit.     Allergies:   Mirtazapine    Social History:  The patient  reports that he has quit smoking. He has never used smokeless tobacco. He reports that he does not drink alcohol or use drugs.   Family History:  The patient's family history is not on file.    ROS: All other systems are reviewed and negative. Unless otherwise mentioned in H&P    PHYSICAL EXAM: VS:  BP 106/62   Pulse (!) 110   Temp 97.7 F (36.5 C)   Ht 5\' 7"  (1.702 m)   Wt 103 lb (46.7 kg)   SpO2 100%   BMI 16.13 kg/m  , BMI Body mass index is 16.13 kg/m. GEN: Emaciated, pale.  HEENT: normal Neck: no JVD, 1/6 carotid  bruits, or masses Cardiac: RRR,  tachycardic,; 2/6 systolic murmur, rubs, or gallops,no edema  Respiratory:  Diminished in the bases, wearing O2 via [place] (which we placed) Pursed lip breathing. GI: soft, nontender, nondistended, + abdominal bruit.  MS: no deformity or atrophy. No pulses palpated in the lower extremities from the ankle down. Thready pulses popliteal. Feet are cool and dusky, left worse than right.  Doppler pulse is absent in the left foot and PT, faint in the right foot.  Skin: warm and dry, pale. Neuro:  Strength and sensation are diminished.Very hard of hearing.  Psych: euthymic mood, full affect   EKG:  Sinus tachycardia rate of 110 bpm.   Recent Labs: 06/11/2018: BUN 14; Creatinine, Ser 0.50; Hemoglobin 12.1; Platelets 286; Potassium 3.4;  Sodium 138    Lipid Panel No results found for: CHOL, TRIG, HDL, CHOLHDL, VLDL, LDLCALC, LDLDIRECT    Wt Readings from Last 3 Encounters:  11/22/18 103 lb (46.7 kg)  11/20/18 109 lb (49.4 kg)  08/19/17 119 lb (54 kg)      Other studies Reviewed: None completed yet.   ASSESSMENT AND PLAN:  1. Dyspnea: Home concentrator is not working correctly. They have ordered another machine.  He is dyspneic at rest, but O2 sat normalizes once placed on O2 via [place] at 3 liters in our office. Echocardiogram is pending in 4 days.   2. PAD: Diminished perfusion in the lower extremities bilaterally. They are dusky but he denies pain or numbness. Etiology not determined yet, as he may have reduced EF causing poor perfusion, verses PAD. I am ordering ABI's to determine this for sure. With his deteriorating health may not be able to address aggressively.   3. Anorexia with unintended weight loss: Complains of nausea and near vomiting with eating any food. Does not drink fluids due to the same symptoms. I am concerned about malignancy with his deterioration. Will order a CT scan of the chest and abdomen to look for abnormalities to help to direct management.   4. Emphysema:  Remains on O2. Will need to be seen by PCP for management and O2 needs.   Current medicines are reviewed at length with the patient today.  I am very concerned about this patient with multiple co-morbidities and worsening symptoms. I am concerned about possible malignancy.  I did talk to him about admission and he angrily refused. He is willing to have OP testing only. I have stressed to him that if his symptoms worsened, his breathing becomes tenuous, or he had severe abdominal pain or intractable nausea and vomiting,  he is to go to hospital. May need to call EMS. Mr. Dani minimizes his symptoms. His daughter is attentive and agrees to call EMS if necessary.  Will need to address End of Life decisions with PCP due to overall poor prognosis.    I have spent 45 minutes with this patient during assessment and review of his symptoms.   Labs/ tests ordered today include: CT of the  Abdomen and pelvis, CT of the chest, ABI's with BMET. Echo is already ordered and pending.   Phill Myron. West Pugh, ANP, AACC   11/22/2018 2:26 PM    [facility] [address] Office [phone] Fax [phone]

## 2018-11-22 ENCOUNTER — Ambulatory Visit (INDEPENDENT_AMBULATORY_CARE_PROVIDER_SITE_OTHER): Payer: Medicare Other | Admitting: Adult Health

## 2018-11-22 ENCOUNTER — Encounter: Payer: Self-pay | Admitting: Adult Health

## 2018-11-22 ENCOUNTER — Other Ambulatory Visit: Payer: Self-pay

## 2018-11-22 VITALS — BP 106/62 | HR 110 | Temp 97.7°F | Ht 67.0 in | Wt 103.0 lb

## 2018-11-22 DIAGNOSIS — R634 Abnormal weight loss: Secondary | ICD-10-CM

## 2018-11-22 DIAGNOSIS — R112 Nausea with vomiting, unspecified: Secondary | ICD-10-CM

## 2018-11-22 DIAGNOSIS — J449 Chronic obstructive pulmonary disease, unspecified: Secondary | ICD-10-CM

## 2018-11-22 DIAGNOSIS — Z79899 Other long term (current) drug therapy: Secondary | ICD-10-CM

## 2018-11-22 DIAGNOSIS — Z9981 Dependence on supplemental oxygen: Secondary | ICD-10-CM

## 2018-11-22 DIAGNOSIS — R0600 Dyspnea, unspecified: Secondary | ICD-10-CM | POA: Diagnosis not present

## 2018-11-22 DIAGNOSIS — R0989 Other specified symptoms and signs involving the circulatory and respiratory systems: Secondary | ICD-10-CM | POA: Diagnosis not present

## 2018-11-22 DIAGNOSIS — I739 Peripheral vascular disease, unspecified: Secondary | ICD-10-CM

## 2018-11-22 DIAGNOSIS — R630 Anorexia: Secondary | ICD-10-CM

## 2018-11-22 NOTE — Patient Instructions (Signed)
Medication Instructions:  Continue current medications  If you need a refill on your cardiac medications before your next appointment, please call your pharmacy.  Labwork: BMP HERE IN OUR OFFICE AT [facility]  You will NOT need to fast   Take the provided lab slips with you to the lab for your blood draw.   When you have your labs (blood work) drawn today and your tests are completely normal, you will receive your results only by MyChart Message (if you have MyChart) -OR-  A paper copy in the mail.  If you have any lab test that is abnormal or we need to change your treatment, we will call you to review these results.  Testing/Procedures: Your physician has requested that you have a lower extremity arterial duplex. This test is an ultrasound of the arteries in the legs or arms. It looks at arterial blood flow in the legs and arms. Allow one hour for Lower and Upper Arterial scans. There are no restrictions or special instructions  Your physician has requested that you have an ankle brachial index (ABI). During this test an ultrasound and blood pressure cuff are used to evaluate the arteries that supply the arms and legs with blood. Allow thirty minutes for this exam. There are no restrictions or special instructions.  Non-Cardiac CT scanning, (CAT scanning), is a noninvasive, special x-ray that produces cross-sectional images of the body using x-rays and a computer. CT scans help physicians diagnose and treat medical conditions. For some CT exams, a contrast material is used to enhance visibility in the area of the body being studied. CT scans provide greater clarity and reveal more details than regular x-ray exams.  Follow-Up: . Your physician recommends that you schedule a follow-up appointment in: 10 days   At [facility], you and your health needs are our priority.  As part of our continuing mission to provide you with exceptional heart care, we have created designated Provider Care Teams.   These Care Teams include your primary Cardiologist (physician) and Advanced Practice Providers (APPs -  Physician Assistants and Nurse Practitioners) who all work together to provide you with the care you need, when you need it.  Thank you for choosing [facility] at [facility]!!

## 2018-11-23 LAB — BASIC METABOLIC PANEL
BUN/Creatinine Ratio: 26 — ABNORMAL HIGH (ref 10–24)
BUN: 20 mg/dL (ref 8–27)
CO2: 30 mmol/L — ABNORMAL HIGH (ref 20–29)
Calcium: 9.6 mg/dL (ref 8.6–10.2)
Chloride: 95 mmol/L — ABNORMAL LOW (ref 96–106)
Creatinine, Ser: 0.76 mg/dL (ref 0.76–1.27)
GFR calc Af Amer: 100 mL/min/{1.73_m2} (ref >59)
GFR calc non Af Amer: 86 mL/min/{1.73_m2} (ref >59)
Glucose: 131 mg/dL — ABNORMAL HIGH (ref 65–99)
Potassium: 4.7 mmol/L (ref 3.5–5.2)
Sodium: 140 mmol/L (ref 134–144)

## 2018-11-23 NOTE — Addendum Note (Signed)
Addended by: Diana Eves on: 11/23/2018 04:00 PM   Modules accepted: Orders

## 2018-11-24 ENCOUNTER — Telehealth: Payer: Self-pay | Admitting: Adult Health

## 2018-11-24 ENCOUNTER — Observation Stay (HOSPITAL_COMMUNITY)
Admission: EM | Admit: 2018-11-24 | Discharge: 2018-11-25 | Disposition: A | Discharge status: Home / Self Care | Payer: Medicare Other | Attending: Internal Medicine | Admitting: Internal Medicine

## 2018-11-24 ENCOUNTER — Emergency Department (HOSPITAL_COMMUNITY): Payer: Medicare Other

## 2018-11-24 ENCOUNTER — Other Ambulatory Visit: Payer: Self-pay

## 2018-11-24 ENCOUNTER — Encounter (HOSPITAL_COMMUNITY): Payer: Self-pay

## 2018-11-24 DIAGNOSIS — R0602 Shortness of breath: Secondary | ICD-10-CM

## 2018-11-24 DIAGNOSIS — M8580 Other specified disorders of bone density and structure, unspecified site: Secondary | ICD-10-CM | POA: Diagnosis not present

## 2018-11-24 DIAGNOSIS — R0902 Hypoxemia: Secondary | ICD-10-CM

## 2018-11-24 DIAGNOSIS — Z79899 Other long term (current) drug therapy: Secondary | ICD-10-CM | POA: Insufficient documentation

## 2018-11-24 DIAGNOSIS — M419 Scoliosis, unspecified: Secondary | ICD-10-CM | POA: Insufficient documentation

## 2018-11-24 DIAGNOSIS — J441 Chronic obstructive pulmonary disease with (acute) exacerbation: Secondary | ICD-10-CM | POA: Diagnosis present

## 2018-11-24 DIAGNOSIS — R627 Adult failure to thrive: Secondary | ICD-10-CM | POA: Insufficient documentation

## 2018-11-24 DIAGNOSIS — Z1159 Encounter for screening for other viral diseases: Secondary | ICD-10-CM | POA: Diagnosis not present

## 2018-11-24 DIAGNOSIS — Z87891 Personal history of nicotine dependence: Secondary | ICD-10-CM | POA: Insufficient documentation

## 2018-11-24 DIAGNOSIS — E43 Unspecified severe protein-calorie malnutrition: Secondary | ICD-10-CM | POA: Diagnosis not present

## 2018-11-24 DIAGNOSIS — I700 Atherosclerosis of aorta: Secondary | ICD-10-CM | POA: Diagnosis not present

## 2018-11-24 DIAGNOSIS — I350 Nonrheumatic aortic (valve) stenosis: Secondary | ICD-10-CM

## 2018-11-24 DIAGNOSIS — I739 Peripheral vascular disease, unspecified: Secondary | ICD-10-CM

## 2018-11-24 DIAGNOSIS — J9621 Acute and chronic respiratory failure with hypoxia: Secondary | ICD-10-CM | POA: Diagnosis not present

## 2018-11-24 DIAGNOSIS — I509 Heart failure, unspecified: Secondary | ICD-10-CM | POA: Diagnosis not present

## 2018-11-24 DIAGNOSIS — K5730 Diverticulosis of large intestine without perforation or abscess without bleeding: Secondary | ICD-10-CM | POA: Diagnosis not present

## 2018-11-24 DIAGNOSIS — R2689 Other abnormalities of gait and mobility: Secondary | ICD-10-CM | POA: Diagnosis not present

## 2018-11-24 LAB — URINALYSIS, ROUTINE W REFLEX MICROSCOPIC
Glucose, UA: NEGATIVE mg/dL
Hgb urine dipstick: NEGATIVE
Ketones, ur: NEGATIVE mg/dL
Leukocytes,Ua: NEGATIVE
Nitrite: NEGATIVE
Protein, ur: 30 mg/dL — AB
Specific Gravity, Urine: 1.039 — ABNORMAL HIGH (ref 1.005–1.030)
pH: 5 (ref 5.0–8.0)

## 2018-11-24 LAB — CBC WITH DIFFERENTIAL/PLATELET
Abs Immature Granulocytes: 0.06 10*3/uL (ref 0.00–0.07)
Basophils Absolute: 0.1 10*3/uL (ref 0.0–0.1)
Basophils Relative: 1 %
Eosinophils Absolute: 0.4 10*3/uL (ref 0.0–0.5)
Eosinophils Relative: 3 %
HCT: 45 % (ref 39.0–52.0)
Hemoglobin: 14 g/dL (ref 13.0–17.0)
Immature Granulocytes: 1 %
Lymphocytes Relative: 4 %
Lymphs Abs: 0.5 10*3/uL — ABNORMAL LOW (ref 0.7–4.0)
MCH: 29.7 pg (ref 26.0–34.0)
MCHC: 31.1 g/dL (ref 30.0–36.0)
MCV: 95.5 fL (ref 80.0–100.0)
Monocytes Absolute: 0.5 10*3/uL (ref 0.1–1.0)
Monocytes Relative: 4 %
Neutro Abs: 11.2 10*3/uL — ABNORMAL HIGH (ref 1.7–7.7)
Neutrophils Relative %: 87 %
Platelets: 366 10*3/uL (ref 150–400)
RBC: 4.71 MIL/uL (ref 4.22–5.81)
RDW: 15.2 % (ref 11.5–15.5)
WBC: 12.8 10*3/uL — ABNORMAL HIGH (ref 4.0–10.5)
nRBC: 0 % (ref 0.0–0.2)

## 2018-11-24 LAB — COMPREHENSIVE METABOLIC PANEL
ALT: 8 U/L (ref 0–44)
AST: 38 U/L (ref 15–41)
Albumin: 3.7 g/dL (ref 3.5–5.0)
Alkaline Phosphatase: 89 U/L (ref 38–126)
Anion gap: 11 (ref 5–15)
BUN: 18 mg/dL (ref 8–23)
CO2: 31 mmol/L (ref 22–32)
Calcium: 9.5 mg/dL (ref 8.9–10.3)
Chloride: 96 mmol/L — ABNORMAL LOW (ref 98–111)
Creatinine, Ser: 0.82 mg/dL (ref 0.61–1.24)
GFR calc Af Amer: >60 mL/min (ref >60)
GFR calc non Af Amer: >60 mL/min (ref >60)
Glucose, Bld: 101 mg/dL — ABNORMAL HIGH (ref 70–99)
Potassium: 4.4 mmol/L (ref 3.5–5.1)
Sodium: 138 mmol/L (ref 135–145)
Total Bilirubin: 1.4 mg/dL — ABNORMAL HIGH (ref 0.3–1.2)
Total Protein: 7.2 g/dL (ref 6.5–8.1)

## 2018-11-24 LAB — SARS CORONAVIRUS 2 BY RT PCR (HOSPITAL ORDER, PERFORMED IN ~~LOC~~ HOSPITAL LAB): SARS Coronavirus 2: NEGATIVE

## 2018-11-24 LAB — BRAIN NATRIURETIC PEPTIDE: B Natriuretic Peptide: 176.3 pg/mL — ABNORMAL HIGH (ref 0.0–100.0)

## 2018-11-24 MED ORDER — TRAZODONE HCL 50 MG PO TABS
50.0000 mg | ORAL_TABLET | Freq: Once | ORAL | Status: AC
  Administered 2018-11-24: 50 mg via ORAL
  Filled 2018-11-24: qty 1

## 2018-11-24 MED ORDER — FLUTICASONE-UMECLIDIN-VILANT 100-62.5-25 MCG/INH IN AEPB: 1.0000 | INHALATION_SPRAY | Freq: Every day | RESPIRATORY_TRACT | Status: DC

## 2018-11-24 MED ORDER — ALBUTEROL SULFATE (2.5 MG/3ML) 0.083% IN NEBU: 2.5000 mg | INHALATION_SOLUTION | Freq: Two times a day (BID) | RESPIRATORY_TRACT | Status: DC

## 2018-11-24 MED ORDER — ACETAMINOPHEN 650 MG RE SUPP: 650.0000 mg | Freq: Four times a day (QID) | RECTAL | Status: DC | PRN

## 2018-11-24 MED ORDER — IPRATROPIUM BROMIDE 0.02 % IN SOLN: 0.5000 mg | Freq: Two times a day (BID) | RESPIRATORY_TRACT | Status: DC

## 2018-11-24 MED ORDER — PANTOPRAZOLE SODIUM 40 MG PO TBEC
40.0000 mg | DELAYED_RELEASE_TABLET | Freq: Two times a day (BID) | ORAL | Status: DC
  Administered 2018-11-24 – 2018-11-25 (×2): 40 mg via ORAL
  Filled 2018-11-24 (×2): qty 1

## 2018-11-24 MED ORDER — IPRATROPIUM BROMIDE 0.02 % IN SOLN
0.5000 mg | Freq: Four times a day (QID) | RESPIRATORY_TRACT | Status: DC
  Administered 2018-11-24: 0.5 mg via RESPIRATORY_TRACT
  Filled 2018-11-24: qty 2.5

## 2018-11-24 MED ORDER — FLUTICASONE FUROATE-VILANTEROL 100-25 MCG/INH IN AEPB
1.0000 | INHALATION_SPRAY | Freq: Every day | RESPIRATORY_TRACT | Status: DC
  Administered 2018-11-25: 1 via RESPIRATORY_TRACT
  Filled 2018-11-24 (×2): qty 28

## 2018-11-24 MED ORDER — FERROUS SULFATE 325 (65 FE) MG PO TABS
325.0000 mg | ORAL_TABLET | Freq: Every day | ORAL | Status: DC
  Administered 2018-11-25: 09:00:00 325 mg via ORAL
  Filled 2018-11-24: qty 1

## 2018-11-24 MED ORDER — GUAIFENESIN-DM 100-10 MG/5ML PO SYRP
5.0000 mL | ORAL_SOLUTION | ORAL | Status: DC | PRN
  Administered 2018-11-24: 5 mL via ORAL
  Filled 2018-11-24: qty 5

## 2018-11-24 MED ORDER — METHYLPREDNISOLONE SODIUM SUCC 40 MG IJ SOLR
40.0000 mg | Freq: Two times a day (BID) | INTRAMUSCULAR | Status: DC
  Administered 2018-11-24 – 2018-11-25 (×2): 40 mg via INTRAVENOUS
  Filled 2018-11-24 (×2): qty 1

## 2018-11-24 MED ORDER — IOPAMIDOL (ISOVUE-300) INJECTION 61%
100.0000 mL | Freq: Once | INTRAVENOUS | Status: AC | PRN
  Administered 2018-11-24: 14:00:00 100 mL via INTRAVENOUS

## 2018-11-24 MED ORDER — ALBUTEROL SULFATE (2.5 MG/3ML) 0.083% IN NEBU
2.5000 mg | INHALATION_SOLUTION | Freq: Four times a day (QID) | RESPIRATORY_TRACT | Status: DC
  Administered 2018-11-24: 2.5 mg via RESPIRATORY_TRACT
  Filled 2018-11-24: qty 3

## 2018-11-24 MED ORDER — POTASSIUM CHLORIDE CRYS ER 10 MEQ PO TBCR
10.0000 meq | EXTENDED_RELEASE_TABLET | Freq: Every day | ORAL | Status: DC
  Administered 2018-11-25: 10 meq via ORAL
  Filled 2018-11-24: qty 1

## 2018-11-24 MED ORDER — ENSURE ENLIVE PO LIQD
237.0000 mL | Freq: Three times a day (TID) | ORAL | Status: DC
  Administered 2018-11-25: 237 mL via ORAL

## 2018-11-24 MED ORDER — ALBUTEROL SULFATE (2.5 MG/3ML) 0.083% IN NEBU: 3.0000 mL | INHALATION_SOLUTION | Freq: Four times a day (QID) | RESPIRATORY_TRACT | Status: DC | PRN

## 2018-11-24 MED ORDER — UMECLIDINIUM BROMIDE 62.5 MCG/INH IN AEPB
1.0000 | INHALATION_SPRAY | Freq: Every day | RESPIRATORY_TRACT | Status: DC
  Administered 2018-11-25: 1 via RESPIRATORY_TRACT
  Filled 2018-11-24 (×2): qty 7

## 2018-11-24 MED ORDER — HEPARIN SODIUM (PORCINE) 5000 UNIT/ML IJ SOLN
5000.0000 [IU] | Freq: Three times a day (TID) | INTRAMUSCULAR | Status: DC
  Administered 2018-11-24 – 2018-11-25 (×3): 5000 [IU] via SUBCUTANEOUS
  Filled 2018-11-24 (×3): qty 1

## 2018-11-24 MED ORDER — FUROSEMIDE 40 MG PO TABS
40.0000 mg | ORAL_TABLET | Freq: Every day | ORAL | Status: DC
  Administered 2018-11-25: 40 mg via ORAL
  Filled 2018-11-24: qty 1

## 2018-11-24 MED ORDER — ACETAMINOPHEN 325 MG PO TABS
650.0000 mg | ORAL_TABLET | Freq: Four times a day (QID) | ORAL | Status: DC | PRN
  Administered 2018-11-25: 650 mg via ORAL
  Filled 2018-11-24: qty 2

## 2018-11-24 MED ORDER — ALBUTEROL SULFATE HFA 108 (90 BASE) MCG/ACT IN AERS: 2.0000 | INHALATION_SPRAY | Freq: Four times a day (QID) | RESPIRATORY_TRACT | Status: DC | PRN

## 2018-11-24 NOTE — ED Notes (Signed)
Daughter- Harriett Rush  [phone]

## 2018-11-24 NOTE — ED Triage Notes (Signed)
Pt has hx of COPD- having issues with foot swelling but heart doctor wanted him to come here. CHF exacbation. 3L [place] normally. MD heard heart murmur and feet are having circulatory issues.

## 2018-11-24 NOTE — ED Notes (Signed)
Cariology PA in with patient.  Will have hospitalist admit.

## 2018-11-24 NOTE — ED Notes (Signed)
Long hx of CHF and admission to [facility] with hx of MRSA and was dc'd with PICC line for 6 weeks.  Dc'd in February and has not felt well since.  Has seen PMD and cardiologist and was instructed to come to ED for admission.  No cough or fever or chills.  Does have chronic SOB which has worsened.  Tested for Covid 1 week ago for cataract surgery, negative.  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  Has had weight loss consistently since last admission.  Lost approx 30 lbs.  Saw Caridologist on Wed, [facility], Dr. Crissie Sickles sp. Daughter reports doppler done to bilateral LE with no pulses found, scheduled for ECHO as well on Monday.  Pt agreed to coming to the ED for admission to perform all needed tests.

## 2018-11-24 NOTE — ED Notes (Signed)
Patient transported to CT 

## 2018-11-24 NOTE — Progress Notes (Signed)
Patient seemed a little upset with the every 6 hours prescribed for breathing meds.  Lungs sound clear, no distress noted.  Changed the meds to twice daily for patient atleast, but patient only wanted once a day.  Patient does not like the mask with breathing treatments, Prefers hand-held method.  Will continue to monitor.

## 2018-11-24 NOTE — ED Notes (Signed)
Attempted to call report. Will call back.  

## 2018-11-24 NOTE — Telephone Encounter (Signed)
Returned call to Maurice Garrison she is wanting [place] to do a direct admit since Maurice Garrison wanted to admit pt at appt on 11-22-2018. Informed EC-Tanya that [place] nor Margaretann Loveless are here today to see pt and we are not able to do direct admits she will need to take pt to ER for admission/testing for evaluation. Cardmaster notified via VM

## 2018-11-24 NOTE — ED Provider Notes (Signed)
[facility] EMERGENCY DEPARTMENT Provider Note   CSN: [id number] Arrival date & time: 11/24/18  1006    History   Chief Complaint Chief Complaint  Patient presents with  . Shortness of Breath    HPI Maurice Garrison is a 78 y.o. male.     The history is provided by the patient and medical records. No language interpreter was used.   Maurice Garrison is a 78 y.o. male  with a PMH of COPD, PAD who presents to the Emergency Department by recommendation of cardiology clinic.  Daughter states that cardiology wanted him to be admitted yesterday to get a work-up and further evaluation of his dyspnea, discoloration of his feet as well as anorexia, but patient would have preferred to go home.  Daughter states that overall, he does not seem to be doing well at home.  He is not eating or drinking and complains of nausea anytime he eats/drinks.  He typically is on 2 L of oxygen at home, but recently had to turn it up to 3 nasal cannula.  Daughter reports that he gets extremely winded with any type of exertion which seems to be getting worse over the last couple of weeks.  He also has had a dusky appearance to his bilateral lower extremities for a couple of weeks as well.  He was placed on Lasix for a few days on the third and daughter states that this seems to have helped with the coloration.  She reports that when he keeps his feet down, they turn black really quickly, but when he keeps them up and elevated, color quickly returns.  He feels as if the pain is better with elevation.   Past Medical History:  Diagnosis Date  . COPD (chronic obstructive pulmonary disease) ([place])   . HOH (hard of hearing)     Patient Active Problem List   Diagnosis Date Noted  . Acute on chronic respiratory failure with hypoxia ([place]) 11/24/2018  . COPD with acute exacerbation ([place]) 11/24/2018  . Acute CHF (congestive heart failure) ([place]) 11/24/2018    Past Surgical History:  Procedure Laterality  Date  . APPENDECTOMY    . HERNIA REPAIR          Home Medications    Prior to Admission medications   Medication Sig Start Date End Date Taking? Authorizing Provider  albuterol (PROVENTIL) (2.5 MG/3ML) 0.083% nebulizer solution Inhale 3 mLs into the lungs every 6 (six) hours as needed for wheezing. 11/23/18  Yes [provider]  ferrous sulfate 325 (65 FE) MG tablet Take 325 mg by mouth daily with breakfast.   Yes [provider]  fluticasone furoate-vilanterol (BREO ELLIPTA) 100-25 MCG/INH AEPB Inhale 1 puff into the lungs daily.   Yes [provider]  Fluticasone-Umeclidin-Vilant (TRELEGY ELLIPTA) 100-62.5-25 MCG/INH AEPB Inhale 1 puff into the lungs daily.   Yes [provider]  furosemide (LASIX) 20 MG tablet Take 2 tablets by mouth daily. 11/16/18 11/24/18 Yes [provider]  OXYGEN Inhale into the lungs. 4 liters   Yes [provider]  pantoprazole (PROTONIX) 40 MG tablet Take 40 mg by mouth 2 (two) times daily.   Yes [provider]  potassium chloride (K-DUR) 10 MEQ tablet Take 1 tablet by mouth daily. 06/08/18  Yes [provider]  PROAIR HFA 108 (90 Base) MCG/ACT inhaler Inhale 2 puffs into the lungs every 6 (six) hours as needed for shortness of breath. 11/23/18  Yes [provider]  umeclidinium  bromide (INCRUSE ELLIPTA) 62.5 MCG/INH AEPB Inhale 1 puff into the lungs daily.   Yes [provider]  predniSONE (DELTASONE) 20 MG tablet 3 tabs po daily x 4 days STARTING 08/20/17 Patient not taking: Reported on 11/24/2018 08/19/17   Street, [place] South[place], PA-C    Family History No family history on file.  Social History Social History   Tobacco Use  . Smoking status: Former Games developermoker  . Smokeless tobacco: Never Used  Substance Use Topics  . Alcohol use: Never    Frequency: Never  . Drug use: Never     Allergies   Mirtazapine   Review of Systems Review of Systems  Constitutional: Positive for  appetite change and unexpected weight change.  Respiratory: Positive for shortness of breath. Negative for cough and wheezing.   Cardiovascular: Negative for chest pain.  All other systems reviewed and are negative.    Physical Exam Updated Vital Signs BP 116/82 (BP Location: Right Arm)   Pulse (!) 108   Temp 99 F (37.2 C) (Oral)   Resp (!) 21   Ht 5\' 7"  (1.702 m)   Wt 46.7 kg   SpO2 (!) 78%   BMI 16.13 kg/m   Physical Exam Vitals signs and nursing note reviewed.  Constitutional:      General: He is not in acute distress.    Appearance: He is well-developed.  HENT:     Head: Normocephalic and atraumatic.  Neck:     Musculoskeletal: Neck supple.  Cardiovascular:     Rate and Rhythm: Regular rhythm.     Heart sounds: Murmur present.  Pulmonary:     Effort: Pulmonary effort is normal. No respiratory distress.     Breath sounds: Normal breath sounds.  Abdominal:     General: There is no distension.     Palpations: Abdomen is soft.     Tenderness: There is no abdominal tenderness.  Musculoskeletal:     Right lower leg: No edema.     Left lower leg: No edema.  Skin:    General: Skin is warm and dry.  Neurological:     Mental Status: He is alert and oriented to person, place, and time.      ED Treatments / Results  Labs (all labs ordered are listed, but only abnormal results are displayed) Labs Reviewed  COMPREHENSIVE METABOLIC PANEL - Abnormal; Notable for the following components:      Result Value   Chloride 96 (*)    Glucose, Bld 101 (*)    Total Bilirubin 1.4 (*)    All other components within normal limits  CBC WITH DIFFERENTIAL/PLATELET - Abnormal; Notable for the following components:   WBC 12.8 (*)    Neutro Abs 11.2 (*)    Lymphs Abs 0.5 (*)    All other components within normal limits  URINALYSIS, ROUTINE W REFLEX MICROSCOPIC - Abnormal; Notable for the following components:   Color, Urine AMBER (*)    Specific Gravity, Urine 1.039 (*)     Bilirubin Urine SMALL (*)    Protein, ur 30 (*)    Bacteria, UA RARE (*)    All other components within normal limits  BRAIN NATRIURETIC PEPTIDE - Abnormal; Notable for the following components:   B Natriuretic Peptide 176.3 (*)    All other components within normal limits  SARS CORONAVIRUS 2 (HOSPITAL ORDER[facility])    EKG EKG Interpretation  Date/Time:  Friday November 24 2018 10:16:01 EDT Ventricular Rate:  100 PR  Interval:  98 QRS Duration: 82 QT Interval:  346 QTC Calculation: 446 R Axis:   53 Text Interpretation:  Sinus rhythm with short PR Otherwise normal ECG Confirmed by Kennis Carina [phone]) on 11/24/2018 11:30:39 AM   Radiology Ct Chest W Contrast  Result Date: 11/24/2018 CLINICAL DATA:  Shortness of breath. COPD. Weight loss. EXAM: CT CHEST, ABDOMEN, AND PELVIS WITH CONTRAST TECHNIQUE: Multidetector CT imaging of the chest, abdomen and pelvis was performed following the standard protocol during bolus administration of intravenous contrast. CONTRAST:  ISOVUE-300 IOPAMIDOL (ISOVUE-300) INJECTION 61% COMPARISON:  April 09, 2018 FINDINGS: CT CHEST FINDINGS Cardiovascular: Enlarged heart. Calcific atherosclerotic disease of the coronary arteries and aorta. Normal caliber of the great vessels. No pulmonary embolus. Mediastinum/Nodes: Partially calcified mediastinal lymph nodes, suggestive of prior granulomatous disease. No lymphadenopathy per CT criteria. Calcified nodules within the right thyroid gland. Esophagus is normal. 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 Probable mild interstitial pulmonary edema. Numerous areas of architectural distortion/scarring throughout both lungs. Left lower lobe peribronchial thickening with irregular areas of scarring versus peribronchial pulmonary nodules or consolidation. Subpleural pulmonary nodule versus prominent area of scarring in the posterior aspect of the superior segment of the  right lower lobe measures approximately 2 cm. Image 54/125, sequence 4. Subpleural calcified mass in the medial right upper lobe, likely benign. Musculoskeletal: No chest wall mass or suspicious bone lesions identified. CT ABDOMEN PELVIS FINDINGS Hepatobiliary: No focal liver abnormality is seen. No gallstones, gallbladder wall thickening, or biliary dilatation. Pancreas: Unremarkable. No pancreatic ductal dilatation or surrounding inflammatory changes. Spleen: Scattered splenic granulomata. Adrenals/Urinary Tract: Adrenal glands are unremarkable. 8 mm too small to be actually characterized hypoattenuated mass within the lower pole of the left kidney. Kidneys are otherwise normal, without renal calculi, focal lesion, or hydronephrosis. Bladder is unremarkable. Stomach/Bowel: Stomach is within normal limits. No evidence of bowel wall thickening, distention, or inflammatory changes. Diffuse colonic diverticulosis without evidence of diverticulitis. Vascular/Lymphatic: Aortic atherosclerosis. No enlarged abdominal or pelvic lymph nodes. Reproductive: Prostate is unremarkable. Bilateral hydrocele, larger on the left. Other: No abdominal wall hernia or abnormality. No abdominopelvic ascites. Musculoskeletal: No acute or significant osseous findings. Scoliosis and spondylosis of the spine. Osteopenia. IMPRESSION: 1. 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 2. Numerous areas of architectural distortion/scarring throughout both lungs. 3. Left lower lobe peribronchial thickening with irregular areas of scarring versus peribronchial pulmonary nodules or consolidation. 4. Subpleural pulmonary nodule versus prominent area of scarring in the posterior aspect of the superior segment of the right lower lobe. Non-contrast chest CT at 3-6 months is recommended. If the nodules are stable at time of repeat CT, then future CT at 18-24 months (from today's scan) is considered optional for low-risk patients, but is  recommended for high-risk patients. This recommendation follows the consensus statement: Guidelines for Management of Incidental Pulmonary Nodules Detected on CT Images: From the Fleischner Society 2017; Radiology 2017; 284:228-243. 5. Enlarged heart.  Mild interstitial pulmonary edema. 6. 8 mm too small to be actually characterized hypoattenuated mass within the lower pole of the left kidney. 7. Diffuse colonic diverticulosis without evidence of diverticulitis. 8. Bilateral hydrocele, larger on the left. Aortic Atherosclerosis (ICD10-I70.0) and Emphysema (ICD10-J43.9). Electronically Signed   By: Ted Mcalpine M.D.   On: 11/24/2018 14:44   Ct Abdomen Pelvis W Contrast  Result Date: 11/24/2018 CLINICAL DATA:  Shortness of breath. COPD. Weight loss. EXAM: CT CHEST, ABDOMEN, AND PELVIS WITH CONTRAST TECHNIQUE: Multidetector CT imaging of the chest, abdomen and pelvis  was performed following the standard protocol during bolus administration of intravenous contrast. CONTRAST:  100mL ISOVUE-300 IOPAMIDOL (ISOVUE-300) INJECTION 61% COMPARISON:  April 09, 2018 FINDINGS: CT CHEST FINDINGS Cardiovascular: Enlarged heart. Calcific atherosclerotic disease of the coronary arteries and aorta. Normal caliber of the great vessels. No pulmonary embolus. Mediastinum/Nodes: Partially calcified mediastinal lymph nodes, suggestive of prior granulomatous disease. No lymphadenopathy per CT criteria. Calcified nodules within the right thyroid gland. Esophagus is normal. 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 Probable mild interstitial pulmonary edema. Numerous areas of architectural distortion/scarring throughout both lungs. Left lower lobe peribronchial thickening with irregular areas of scarring versus peribronchial pulmonary nodules or consolidation. Subpleural pulmonary nodule versus prominent area of scarring in the posterior aspect of the superior segment of the right lower lobe measures  approximately 2 cm. Image 54/125, sequence 4. Subpleural calcified mass in the medial right upper lobe, likely benign. Musculoskeletal: No chest wall mass or suspicious bone lesions identified. CT ABDOMEN PELVIS FINDINGS Hepatobiliary: No focal liver abnormality is seen. No gallstones, gallbladder wall thickening, or biliary dilatation. Pancreas: Unremarkable. No pancreatic ductal dilatation or surrounding inflammatory changes. Spleen: Scattered splenic granulomata. Adrenals/Urinary Tract: Adrenal glands are unremarkable. 8 mm too small to be actually characterized hypoattenuated mass within the lower pole of the left kidney. Kidneys are otherwise normal, without renal calculi, focal lesion, or hydronephrosis. Bladder is unremarkable. Stomach/Bowel: Stomach is within normal limits. No evidence of bowel wall thickening, distention, or inflammatory changes. Diffuse colonic diverticulosis without evidence of diverticulitis. Vascular/Lymphatic: Aortic atherosclerosis. No enlarged abdominal or pelvic lymph nodes. Reproductive: Prostate is unremarkable. Bilateral hydrocele, larger on the left. Other: No abdominal wall hernia or abnormality. No abdominopelvic ascites. Musculoskeletal: No acute or significant osseous findings. Scoliosis and spondylosis of the spine. Osteopenia. IMPRESSION: 1. 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 2. Numerous areas of architectural distortion/scarring throughout both lungs. 3. Left lower lobe peribronchial thickening with irregular areas of scarring versus peribronchial pulmonary nodules or consolidation. 4. Subpleural pulmonary nodule versus prominent area of scarring in the posterior aspect of the superior segment of the right lower lobe. Non-contrast chest CT at 3-6 months is recommended. If the nodules are stable at time of repeat CT, then future CT at 18-24 months (from today's scan) is considered optional for low-risk patients, but is recommended for high-risk patients.  This recommendation follows the consensus statement: Guidelines for Management of Incidental Pulmonary Nodules Detected on CT Images: From the Fleischner Society 2017; Radiology 2017; 284:228-243. 5. Enlarged heart.  Mild interstitial pulmonary edema. 6. 8 mm too small to be actually characterized hypoattenuated mass within the lower pole of the left kidney. 7. Diffuse colonic diverticulosis without evidence of diverticulitis. 8. Bilateral hydrocele, larger on the left. Aortic Atherosclerosis (ICD10-I70.0) and Emphysema (ICD10-J43.9). Electronically Signed   By: Ted Mcalpineobrinka  Dimitrova M.D.   On: 11/24/2018 14:44    Procedures Procedures (including critical care time)  Medications Ordered in ED Medications  iopamidol (ISOVUE-300) 61 % injection 100 mL (100 mLs Intravenous Contrast Given 11/24/18 1411)     Initial Impression / Assessment and Plan / ED Course  I have reviewed the triage vital signs and the nursing notes.  Pertinent labs & imaging results that were available during my care of the patient were reviewed by me and considered in my medical decision making (see chart for details).       Maurice Garrison is a 78 y.o. male who presents to ED for increased oxygen requirement at home as well as dusky coloration  to bilateral lower extremities and decreased appetite.  Per chart review from cardiology appointment a couple of days ago, there was concern for possible malignancy given anorexia and weight loss recommending CT chest/and/pelvis which were obtained today.  Labs reviewed: Leukocytosis of 12.8, BNP 176.  COVID negative.  Cardiology consulted.  Please see consult note for full recommendations. Does want medical admission. Needs echo later today. Hospitalist consulted who will admit.   Patient seen by and discussed with Dr. Pilar PlateBero who agrees with treatment plan.    Final Clinical Impressions(s) / ED Diagnoses   Final diagnoses:  SOB (shortness of breath)  Hypoxia  Peripheral artery  disease [facility](HCC)    ED Discharge Orders    None       Ward, Chase PicketJaime Pilcher, PA-C 11/24/18 1534    Sabas SousBero, Michael M, MD 11/27/18 [phone]

## 2018-11-24 NOTE — ED Notes (Signed)
Pt aware of admission status.  PA has updated daughter on the phone.  Pt offered fluids and snacks and is pleasant without any noted distress.  Able to stand at bedside to use urinal without gross need for (A).

## 2018-11-24 NOTE — Consult Note (Addendum)
Cardiology Consultation:   Patient ID: Maurice Garrison MRN: [id number]; DOB: 09/30/1940  Admit date: 11/24/2018 Date of Consult: 11/24/2018  Primary Care Provider: Laqueta DueFurr, Sara M., MD Primary Cardiologist: Dr. Jacques NavyAcharya    Patient Profile:   Maurice Garrison is a 78 y.o. male with a hx of hypoxic respiratory failure secondary to COPD on home oxygen and former smoker who is being seen today for the evaluation of dyspnea at the request of Dr. Pilar PlateBero.   Echocardiogram February 2020 at outside hospital (in care everywhere) showed grossly normal LV function.  Technically difficult study.  Mild TR, mild aortic stenosis RSVP 53 mmHg.  Patient with history of bronchiectasis and severe COPD.  Followed by pulmonologist @ [facility].  Previously underwent esophageal dilatation due to concern for aspiration.  He was seen in ER 08/20/2018 for pneumonia.  Treated with prednisone and IV dose of levofloxacin.  Left AMA.  Has not had left eye cataract surgery 5 months ago.  He underwent right cataract surgery 11/14/2018.  History of Present Illness:   Mr. Maurice Garrison was seen by Dr. Jacques NavyAcharya 11/20/2018 virtually for ongoing breathing difficulty.  Notes are currently pending.  Seems placed on Lasix 40 mg daily.  He was seen in clinic by Joni ReiningKathryn Lawrence, NP 11/22/2018 ongoing dyspnea, lack of appetite and weight loss.  There was a concern for cardiomyopathy and underlying malignancy.  Order echocardiogram and CT of the chest.  Also order ABIs given poor perfusion in lower extremity.  Patient is hard of hearing.  No family member at bedside.  He is unsure if he is taking Lasix or not.  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  Patient reports using 3.5 L oxygen at night.  He has oxygen making machine and use intermittently in his office at work.  Reports 1 month history of progressive worsening dyspnea.  He denies fever, chills, cough, congestion, chest pain, palpitation, melena or syncope.   COVID negative. BNP 176  Heart Pathway  Score:     Past Medical History:  Diagnosis Date  . COPD (chronic obstructive pulmonary disease) ([facility])   . HOH (hard of hearing)     Past Surgical History:  Procedure Laterality Date  . APPENDECTOMY    . HERNIA REPAIR      Inpatient Medications: Scheduled Meds:  Continuous Infusions:  PRN Meds:   Allergies:    Allergies  Allergen Reactions  . Mirtazapine     sleeplessness    Social History:   Social History   Socioeconomic History  . Marital status: Unknown    Spouse name: Not on file  . Number of children: Not on file  . Years of education: Not on file  . Highest education level: Not on file  Occupational History  . Not on file  Social Needs  . Financial resource strain: Not on file  . Food insecurity    Worry: Not on file    Inability: Not on file  . Transportation needs    Medical: Not on file    Non-medical: Not on file  Tobacco Use  . Smoking status: Former Games developermoker  . Smokeless tobacco: Never Used  Substance and Sexual Activity  . Alcohol use: Never    Frequency: Never  . Drug use: Never  . Sexual activity: Not on file  Lifestyle  . Physical activity    Days per week: Not on file    Minutes per session: Not on file  . Stress: Not on file  Relationships  . Social  connections    Talks on phone: Not on file    Gets together: Not on file    Attends religious service: Not on file    Active member of club or organization: Not on file    Attends meetings of clubs or organizations: Not on file    Relationship status: Not on file  . Intimate partner violence    Fear of current or ex partner: Not on file    Emotionally abused: Not on file    Physically abused: Not on file    Forced sexual activity: Not on file  Other Topics Concern  . Not on file  Social History Narrative  . Not on file    Family History:   Patient is unsure about family history  ROS:  Please see the history of present illness.  All other ROS reviewed and negative.      Physical Exam/Data:   Vitals:   11/24/18 1039 11/24/18 1100 11/24/18 1200 11/24/18 1230  BP:  134/80 133/89 138/83  Pulse:    97  Resp:  17 17 15   Temp:      TempSrc:      SpO2:    100%  Weight: 46.7 kg     Height: 5\' 7"  (1.702 m)      No intake or output data in the 24 hours ending 11/24/18 1405 Last 3 Weights 11/24/2018 11/22/2018 11/20/2018  Weight (lbs) 103 lb 103 lb 109 lb  Weight (kg) 46.72 kg 46.72 kg 49.442 kg     Body mass index is 16.13 kg/m.  General: Frail thin ill-appearing male on supplemental oxygen  HEENT: normal Lymph: no adenopathy Neck: no JVD Endocrine:  No thryomegaly Vascular: No carotid bruits; FA pulses 2+ bilaterally without bruits  Cardiac:  normal S1, S2; tachycardia regular rhythm; 3/6 systolic murmur Lungs: Diminished breath sounds on upper lobe bilaterally Abd: soft, nontender, no hepatomegaly  Ext: no edema Musculoskeletal:  No deformities Skin: Bilateral lower extremities are cold and difficult to palpate pulse Neuro:  CNs 2-12 intact, no focal abnormalities noted Psych:  Normal affect   EKG:  The EKG was personally reviewed and demonstrates: Sinus tachycardia at rate of 100 bpm with short PR interval Telemetry:  Telemetry was personally reviewed and demonstrates: Sinus tachycardia at 100-110s Relevant CV Studies:  Echo 05/2018 Summary Technically difficult and limited study due to respiratory status. Pt is also on Bipap The left ventricular endocardium was not well visualized. Grossly normal left ventricular function. Moderate mitral annular calcification mild mitral valve gradient There is moderate aortic sclerosis noted with Mild aortic stenosis, but the measurement might be off. Mild tricuspid regurgitation. Moderate pulmonary hypertension. estimated RVSP 53 mmHg Diastolic function is indeterminate   Laboratory Data:  Chemistry Recent Labs  Lab 11/22/18 1515 11/24/18 1105  NA 140 138  K 4.7 4.4  CL 95* 96*  CO2 30* 31   GLUCOSE 131* 101*  BUN 20 18  CREATININE 0.76 0.82  CALCIUM 9.6 9.5  GFRNONAA 86 >60  GFRAA 100 >60  ANIONGAP  --  11    Recent Labs  Lab 11/24/18 1105  PROT 7.2  ALBUMIN 3.7  AST 38  ALT 8  ALKPHOS 89  BILITOT 1.4*   Hematology Recent Labs  Lab 11/24/18 1105  WBC 12.8*  RBC 4.71  HGB 14.0  HCT 45.0  MCV 95.5  MCH 29.7  MCHC 31.1  RDW 15.2  PLT 366   BNP Recent Labs  Lab 11/24/18 1105  BNP 176.3*  Radiology/Studies:  No results found.  Assessment and Plan:   1. Dyspnea -Aggressively worsen over the past 1 month.  He is unsure if taking Lasix or not.  He works in [employer] company.  Uses oxygen intermittently at work 3.5 L at home at night.  Likely due to lung disease.  BNP 176.  He has no lower extremity edema but cool extremities. -Echocardiogram February 2020 was technically difficult study with overall preserved echo. - Will repeat echo to evaluate LV function and murmur. Recommended IM admission for further evaluation.   2.  Acute on chronic hypoxic respiratory failure secondary to COPD -Intermittently dropping oxygen saturation during evaluation -Pending of CT of chest and abdomen   3.  Failure to thrive -Loss of appetite and weight loss -Pending CT to rule out malignancy -Still works full-time in Therapist, artmetal manufacturing . He will benefits from social worker consult  4. Cataract - Left eye cataract surgery 5 months ago.  He underwent right cataract surgery 11/14/2018.   For questions or updates, please contact [facility] Please consult [email or web address] for contact info under     Lorelei PontSigned, Bhavinkumar Bhagat, [place]  11/24/2018 2:05 PM   Patient examined chart reviewed. Charming elderly male. Significant lung disease on home oxygen. Has had increasing dyspnea and failure to thrive. No chest pain Denies cough sputum / fever. Sats marginal during exam With out high flow oxygen BNP only 176 and no CHF on CXR. He does have a loud AS murmur on  exam and has had mild AS by outside echo in February. Will repeat echo to make sure RV/LV function is normal and AS has not progressed quickly Does not appear to have any acute cardiac process. Suspect this is all a flair of his lung disease  Charlton Hawseter Bethzy Hauck

## 2018-11-24 NOTE — ED Notes (Signed)
ED TO INPATIENT HANDOFF REPORT  ED Nurse Name and Phone #: Britta MccreedyMichele Shawana Knoch RN  S Name/Age/Gender Maurice Garrison 78 y.o. male Room/Bed: 029C/029C  Code Status   Code Status: Not on file  Home/SNF/Other Home Patient oriented to: self, place, time and situation Is this baseline? Yes   Triage Complete: Triage complete  Chief Complaint sob  Triage Note Pt has hx of COPD- having issues with foot swelling but heart doctor wanted him to come here. CHF exacbation. 3L [place] normally. MD heard heart murmur and feet are having circulatory issues.   Allergies Allergies  Allergen Reactions  . Mirtazapine     sleeplessness    Level of Care/Admitting Diagnosis ED Disposition    ED Disposition Condition Comment   Admit  [facility] [[id number]]  Level of Care: Telemetry Cardiac [103]  I expect the patient will be discharged within 24 hours: Yes  LOW acuity---Tx typically complete <24 hrs---ACUTE conditions typically can be evaluated <24 hours---LABS likely to return to acceptable levels <24 hours---IS near functional baseline---EXPECTED to return to current living arrangement---NOT newly hypoxic: Meets criteria for 5C-Observation unit  Covid Evaluation: Asymptomatic Screening Protocol (No Symptoms)  Diagnosis: COPD exacerbation (HCC) [161096][[id number]]  Admitting Physician: Chiquita LothELGERGAWY, DAWOOD S [[id number]]  Attending Physician: Randol KernELGERGAWY, DAWOOD S [[id number]]  PT Class (Do Not Modify): Observation [104]  PT Acc Code (Do Not Modify): Observation [10022]       B Medical/Surgery History Past Medical History:  Diagnosis Date  . COPD (chronic obstructive pulmonary disease) (HCC)   . HOH (hard of hearing)    Past Surgical History:  Procedure Laterality Date  . APPENDECTOMY    . HERNIA REPAIR       A IV Location/Drains/Wounds Patient Lines/Drains/Airways Status   Active Line/Drains/Airways    Name:   Placement date:   Placement time:   Site:   Days:   Peripheral IV  11/24/18 Left Antecubital   11/24/18    1125    Antecubital   less than 1          Intake/Output Last 24 hours No intake or output data in the 24 hours ending 11/24/18 1714  Labs/Imaging Results for orders placed or performed during the hospital encounter of 11/24/18 (from the past 48 hour(s))  Urinalysis, Routine w reflex microscopic     Status: Abnormal   Collection Time: 11/24/18 10:58 AM  Result Value Ref Range   Color, Urine AMBER (A) YELLOW    Comment: BIOCHEMICALS MAY BE AFFECTED BY COLOR   APPearance CLEAR CLEAR   Specific Gravity, Urine 1.039 (H) 1.005 - 1.030   pH 5.0 5.0 - 8.0   Glucose, UA NEGATIVE NEGATIVE mg/dL   Hgb urine dipstick NEGATIVE NEGATIVE   Bilirubin Urine SMALL (A) NEGATIVE   Ketones, ur NEGATIVE NEGATIVE mg/dL   Protein, ur 30 (A) NEGATIVE mg/dL   Nitrite NEGATIVE NEGATIVE   Leukocytes,Ua NEGATIVE NEGATIVE   RBC / HPF 0-5 0 - 5 RBC/hpf   WBC, UA 0-5 0 - 5 WBC/hpf   Bacteria, UA RARE (A) NONE SEEN   Mucus PRESENT    Hyaline Casts, UA PRESENT     Comment: Performed at [facility], [address] [place], [place] [address]  Comprehensive metabolic panel     Status: Abnormal   Collection Time: 11/24/18 11:05 AM  Result Value Ref Range   Sodium 138 135 - 145 mmol/L   Potassium 4.4 3.5 - 5.1 mmol/L   Chloride 96 (L)  98 - 111 mmol/L   CO2 31 22 - 32 mmol/L   Glucose, Bld 101 (H) 70 - 99 mg/dL    Comment: SLIGHT HEMOLYSIS   BUN 18 8 - 23 mg/dL   Creatinine, Ser 2.840.82 0.61 - 1.24 mg/dL   Calcium 9.5 8.9 - 13.210.3 mg/dL   Total Protein 7.2 6.5 - 8.1 g/dL   Albumin 3.7 3.5 - 5.0 g/dL   AST 38 15 - 41 U/L   ALT 8 0 - 44 U/L   Alkaline Phosphatase 89 38 - 126 U/L   Total Bilirubin 1.4 (H) 0.3 - 1.2 mg/dL   GFR calc non Af Amer >60 >60 mL/min   GFR calc Af Amer >60 >60 mL/min   Anion gap 11 5 - 15    Comment: Performed at [facility], [address] [place], [place] [address]  CBC with Differential     Status: Abnormal   Collection Time:  11/24/18 11:05 AM  Result Value Ref Range   WBC 12.8 (H) 4.0 - 10.5 K/uL   RBC 4.71 4.22 - 5.81 MIL/uL   Hemoglobin 14.0 13.0 - 17.0 g/dL   HCT 27.245.0 53.639.0 - 64.452.0 %   MCV 95.5 80.0 - 100.0 fL   MCH 29.7 26.0 - 34.0 pg   MCHC 31.1 30.0 - 36.0 g/dL   RDW 03.415.2 74.211.5 - 59.515.5 %   Platelets 366 150 - 400 K/uL   nRBC 0.0 0.0 - 0.2 %   Neutrophils Relative % 87 %   Neutro Abs 11.2 (H) 1.7 - 7.7 K/uL   Lymphocytes Relative 4 %   Lymphs Abs 0.5 (L) 0.7 - 4.0 K/uL   Monocytes Relative 4 %   Monocytes Absolute 0.5 0.1 - 1.0 K/uL   Eosinophils Relative 3 %   Eosinophils Absolute 0.4 0.0 - 0.5 K/uL   Basophils Relative 1 %   Basophils Absolute 0.1 0.0 - 0.1 K/uL   Immature Granulocytes 1 %   Abs Immature Granulocytes 0.06 0.00 - 0.07 K/uL    Comment: Performed at [facility], [address] [place], [place] [address]  Brain natriuretic peptide     Status: Abnormal   Collection Time: 11/24/18 11:05 AM  Result Value Ref Range   B Natriuretic Peptide 176.3 (H) 0.0 - 100.0 pg/mL    Comment: Performed at [facility], [address] [place], [place] [address]  SARS Coronavirus 2 [facility](Hospital order, Performed in [facility] hospital lab) Nasopharyngeal Nasopharyngeal Swab     Status: None   Collection Time: 11/24/18 11:47 AM   Specimen: Nasopharyngeal Swab  Result Value Ref Range   SARS Coronavirus 2 NEGATIVE NEGATIVE    Comment: (NOTE) If result is NEGATIVE SARS-CoV-2 target nucleic acids are NOT DETECTED. The SARS-CoV-2 RNA is generally detectable in upper and lower  respiratory specimens during the acute phase of infection. The lowest  concentration of SARS-CoV-2 viral copies this assay can detect is 250  copies / mL. A negative result does not preclude SARS-CoV-2 infection  and should not be used as the sole basis for treatment or other  patient management decisions.  A negative result may occur with  improper specimen collection / handling, submission of specimen other  than  nasopharyngeal swab, presence of viral mutation(s) within the  areas targeted by this assay, and inadequate number of viral copies  (<250 copies / mL). A negative result must be combined with clinical  observations, patient history, and epidemiological information. If result  is POSITIVE SARS-CoV-2 target nucleic acids are DETECTED. The SARS-CoV-2 RNA is generally detectable in upper and lower  respiratory specimens dur ing the acute phase of infection.  Positive  results are indicative of active infection with SARS-CoV-2.  Clinical  correlation with patient history and other diagnostic information is  necessary to determine patient infection status.  Positive results do  not rule out bacterial infection or co-infection with other viruses. If result is PRESUMPTIVE POSTIVE SARS-CoV-2 nucleic acids MAY BE PRESENT.   A presumptive positive result was obtained on the submitted specimen  and confirmed on repeat testing.  While 2019 novel coronavirus  (SARS-CoV-2) nucleic acids may be present in the submitted sample  additional confirmatory testing may be necessary for epidemiological  and / or clinical management purposes  to differentiate between  SARS-CoV-2 and other Sarbecovirus currently known to infect humans.  If clinically indicated additional testing with an alternate test  methodology [phone]) is advised. The SARS-CoV-2 RNA is generally  detectable in upper and lower respiratory sp ecimens during the acute  phase of infection. The expected result is Negative. Fact Sheet for Patients:  StrictlyIdeas.no Fact Sheet for Healthcare Providers: BankingDealers.co.za This test is not yet approved or cleared by the [place] FDA and has been authorized for detection and/or diagnosis of SARS-CoV-2 by FDA under an Emergency Use Authorization (EUA).  This EUA will remain in effect (meaning this test can be used) for the duration of  the COVID-19 declaration under Section 564(b)(1) of the Act, 21 U.S.C. section 360bbb-3(b)(1), unless the authorization is terminated or revoked sooner. Performed at [facility], [place] [address] [place], [place] [address]    Ct Chest W Contrast  Result Date: 11/24/2018 CLINICAL DATA:  Shortness of breath. COPD. Weight loss. EXAM: CT CHEST, ABDOMEN, AND PELVIS WITH CONTRAST TECHNIQUE: Multidetector CT imaging of the chest, abdomen and pelvis was performed following the standard protocol during bolus administration of intravenous contrast. CONTRAST:  141mL ISOVUE-300 IOPAMIDOL (ISOVUE-300) INJECTION 61% COMPARISON:  April 09, 2018 FINDINGS: CT CHEST FINDINGS Cardiovascular: Enlarged heart. Calcific atherosclerotic disease of the coronary arteries and aorta. Normal caliber of the great vessels. No pulmonary embolus. Mediastinum/Nodes: Partially calcified mediastinal lymph nodes, suggestive of prior granulomatous disease. No lymphadenopathy per CT criteria. Calcified nodules within the right thyroid gland. Esophagus is normal. 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 Probable mild interstitial pulmonary edema. Numerous areas of architectural distortion/scarring throughout both lungs. Left lower lobe peribronchial thickening with irregular areas of scarring versus peribronchial pulmonary nodules or consolidation. Subpleural pulmonary nodule versus prominent area of scarring in the posterior aspect of the superior segment of the right lower lobe measures approximately 2 cm. Image 54/125, sequence 4. Subpleural calcified mass in the medial right upper lobe, likely benign. Musculoskeletal: No chest wall mass or suspicious bone lesions identified. CT ABDOMEN PELVIS FINDINGS Hepatobiliary: No focal liver abnormality is seen. No gallstones, gallbladder wall thickening, or biliary dilatation. Pancreas: Unremarkable. No pancreatic ductal dilatation or surrounding inflammatory changes.  Spleen: Scattered splenic granulomata. Adrenals/Urinary Tract: Adrenal glands are unremarkable. 8 mm too small to be actually characterized hypoattenuated mass within the lower pole of the left kidney. Kidneys are otherwise normal, without renal calculi, focal lesion, or hydronephrosis. Bladder is unremarkable. Stomach/Bowel: Stomach is within normal limits. No evidence of bowel wall thickening, distention, or inflammatory changes. Diffuse colonic diverticulosis without evidence of diverticulitis. Vascular/Lymphatic: Aortic atherosclerosis. No enlarged abdominal or pelvic lymph nodes. Reproductive: Prostate is unremarkable. Bilateral hydrocele, larger on the left.  Other: No abdominal wall hernia or abnormality. No abdominopelvic ascites. Musculoskeletal: No acute or significant osseous findings. Scoliosis and spondylosis of the spine. Osteopenia. IMPRESSION: 1. 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 2. Numerous areas of architectural distortion/scarring throughout both lungs. 3. Left lower lobe peribronchial thickening with irregular areas of scarring versus peribronchial pulmonary nodules or consolidation. 4. Subpleural pulmonary nodule versus prominent area of scarring in the posterior aspect of the superior segment of the right lower lobe. Non-contrast chest CT at 3-6 months is recommended. If the nodules are stable at time of repeat CT, then future CT at 18-24 months (from today's scan) is considered optional for low-risk patients, but is recommended for high-risk patients. This recommendation follows the consensus statement: Guidelines for Management of Incidental Pulmonary Nodules Detected on CT Images: From the Fleischner Society 2017; Radiology 2017; 284:228-243. 5. Enlarged heart.  Mild interstitial pulmonary edema. 6. 8 mm too small to be actually characterized hypoattenuated mass within the lower pole of the left kidney. 7. Diffuse colonic diverticulosis without evidence of diverticulitis.  8. Bilateral hydrocele, larger on the left. Aortic Atherosclerosis (ICD10-I70.0) and Emphysema (ICD10-J43.9). Electronically Signed   By: Ted Mcalpine M.D.   On: 11/24/2018 14:44   Ct Abdomen Pelvis W Contrast  Result Date: 11/24/2018 CLINICAL DATA:  Shortness of breath. COPD. Weight loss. EXAM: CT CHEST, ABDOMEN, AND PELVIS WITH CONTRAST TECHNIQUE: Multidetector CT imaging of the chest, abdomen and pelvis was performed following the standard protocol during bolus administration of intravenous contrast. CONTRAST:  ISOVUE-300 IOPAMIDOL (ISOVUE-300) INJECTION 61% COMPARISON:  April 09, 2018 FINDINGS: CT CHEST FINDINGS Cardiovascular: Enlarged heart. Calcific atherosclerotic disease of the coronary arteries and aorta. Normal caliber of the great vessels. No pulmonary embolus. Mediastinum/Nodes: Partially calcified mediastinal lymph nodes, suggestive of prior granulomatous disease. No lymphadenopathy per CT criteria. Calcified nodules within the right thyroid gland. Esophagus is normal. 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 Probable mild interstitial pulmonary edema. Numerous areas of architectural distortion/scarring throughout both lungs. Left lower lobe peribronchial thickening with irregular areas of scarring versus peribronchial pulmonary nodules or consolidation. Subpleural pulmonary nodule versus prominent area of scarring in the posterior aspect of the superior segment of the right lower lobe measures approximately 2 cm. Image 54/125, sequence 4. Subpleural calcified mass in the medial right upper lobe, likely benign. Musculoskeletal: No chest wall mass or suspicious bone lesions identified. CT ABDOMEN PELVIS FINDINGS Hepatobiliary: No focal liver abnormality is seen. No gallstones, gallbladder wall thickening, or biliary dilatation. Pancreas: Unremarkable. No pancreatic ductal dilatation or surrounding inflammatory changes. Spleen: Scattered splenic granulomata.  Adrenals/Urinary Tract: Adrenal glands are unremarkable. 8 mm too small to be actually characterized hypoattenuated mass within the lower pole of the left kidney. Kidneys are otherwise normal, without renal calculi, focal lesion, or hydronephrosis. Bladder is unremarkable. Stomach/Bowel: Stomach is within normal limits. No evidence of bowel wall thickening, distention, or inflammatory changes. Diffuse colonic diverticulosis without evidence of diverticulitis. Vascular/Lymphatic: Aortic atherosclerosis. No enlarged abdominal or pelvic lymph nodes. Reproductive: Prostate is unremarkable. Bilateral hydrocele, larger on the left. Other: No abdominal wall hernia or abnormality. No abdominopelvic ascites. Musculoskeletal: No acute or significant osseous findings. Scoliosis and spondylosis of the spine. Osteopenia. IMPRESSION: 1. 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 2. Numerous areas of architectural distortion/scarring throughout both lungs. 3. Left lower lobe peribronchial thickening with irregular areas of scarring versus peribronchial pulmonary nodules or consolidation. 4. Subpleural pulmonary nodule versus prominent area of scarring in the posterior aspect of the superior segment  of the right lower lobe. Non-contrast chest CT at 3-6 months is recommended. If the nodules are stable at time of repeat CT, then future CT at 18-24 months (from today's scan) is considered optional for low-risk patients, but is recommended for high-risk patients. This recommendation follows the consensus statement: Guidelines for Management of Incidental Pulmonary Nodules Detected on CT Images: From the Fleischner Society 2017; Radiology 2017; 284:228-243. 5. Enlarged heart.  Mild interstitial pulmonary edema. 6. 8 mm too small to be actually characterized hypoattenuated mass within the lower pole of the left kidney. 7. Diffuse colonic diverticulosis without evidence of diverticulitis. 8. Bilateral hydrocele, larger on the  left. Aortic Atherosclerosis (ICD10-I70.0) and Emphysema (ICD10-J43.9). Electronically Signed   By: Ted Mcalpineobrinka  Dimitrova M.D.   On: 11/24/2018 14:44    Pending Labs Unresulted Labs (From admission, onward)    Start     Ordered   Signed and Held  CBC  (heparin)  Once,   R    Comments: Baseline for heparin therapy IF NOT ALREADY DRAWN.  Notify MD if PLT < 100 K.    Signed and Held   Signed and Held  Creatinine, serum  (heparin)  Once,   R    Comments: Baseline for heparin therapy IF NOT ALREADY DRAWN.    Signed and Held   Signed and Held  Basic metabolic panel  Tomorrow morning,   R     Signed and Held   Signed and Held  CBC  Tomorrow morning,   R     Signed and Held          Vitals/Pain Today's Vitals   11/24/18 1600 11/24/18 1630 11/24/18 1653 11/24/18 1700  BP: 138/84 110/81  123/90  Pulse:      Resp:  16  20  Temp:      TempSrc:      SpO2:      Weight:      Height:      PainSc:   0-No pain     Isolation Precautions No active isolations  Medications Medications  feeding supplement (ENSURE ENLIVE) (ENSURE ENLIVE) liquid 237 mL (has no administration in time range)  iopamidol (ISOVUE-300) 61 % injection 100 mL (100 mLs Intravenous Contrast Given 11/24/18 1411)    Mobility walks Low fall risk   Focused Assessments Cardiac Assessment Handoff:    No results found for: CKTOTAL, CKMB, CKMBINDEX, TROPONINI No results found for: DDIMER Does the Patient currently have chest pain? No      R Recommendations: See Admitting Provider Note  Report given to:   Additional Notes:

## 2018-11-24 NOTE — Telephone Encounter (Signed)
Patient's daughter called stating her father is still not feeling well. He was in the other day Jory Sims wanted to admit him into the hospital, at that point he didn't want to.  Since he is still not feeling well, he would like to be admitted now if possible.

## 2018-11-24 NOTE — H&P (Addendum)
TRH H&P   Patient Demographics:    Murel Shenberger, is a 78 y.o. male  MRN: [id number]   DOB - [birth year]  Admit Date - 11/24/2018  Outpatient Primary MD for the patient is Kristen Loader, Tor Netters., MD  Referring MD/NP/PA: PA wards  Outpatient Specialists: [facility]  Patient coming from:  home  Chief Complaint  Patient presents with  . Shortness of Breath      HPI:    Chon Buhl  is a 78 y.o. 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 -Patient was sent by cardiology clinic for evaluation for dyspnea, and failure to thrive, overall patient with dyspnea, which appears to be chronic, but has been worsening over the last few weeks, as well patient has been having worsening appetite, weight loss, patient with progressive dyspnea, mainly upon exertion, is with known history of advanced COPD, but remains at baseline 3.5 L nasal cannula, denies any fever, chills, hemoptysis, coffee-ground emesis. -In ED patient was noted to be extremely frail and cachectic, he was at baseline with 3.5 L nasal cannula, CT chest/abdomen was performed which was significant for small pulmonary nodules, and severe emphysema, patient was seen by cardiology who recommended to obtain echo for further work-up, I was called to admit.    Review of systems:    In addition to the HPI above,  No Fever-chills No Headache, No changes with Vision or hearing, No problems swallowing food or Liquids, No Chest pain, reports dyspnea, and dry cough. No Abdominal pain, No Nausea or Vommitting, Bowel  movements are regular, No Blood in stool or Urine, No dysuria, No new skin rashes or bruises, No new joints pains-aches,  No new weakness, tingling, numbness in any extremity, No recent weight gain or loss, No polyuria, polydypsia or polyphagia, No significant Mental Stressors.  A full 10 point Review of Systems was done, except as stated above, all other Review of Systems were negative.   With Past History of the following :    Past Medical History:  Diagnosis Date  . COPD (chronic obstructive pulmonary disease) (HCC)   . HOH (hard of hearing)       Past Surgical History:  Procedure Laterality Date  . APPENDECTOMY    . HERNIA REPAIR        Social History:     Social History   Tobacco Use  . Smoking status: Former Games developer  . Smokeless tobacco: Never Used  Substance Use Topics  . Alcohol use: Never    Frequency: Never      Family History :  No family history of coronary artery disease at young age   Home Medications:   Prior to Admission medications   Medication Sig Start Date End Date Taking? Authorizing Provider  albuterol (PROVENTIL) (2.5 MG/3ML) 0.083% nebulizer solution Inhale 3 mLs into the lungs every 6 (six) hours as needed for wheezing. 11/23/18  Yes [provider]  ferrous sulfate 325 (65 FE) MG tablet Take 325 mg by mouth daily with breakfast.   Yes [provider]  fluticasone furoate-vilanterol (BREO ELLIPTA) 100-25 MCG/INH AEPB Inhale 1 puff into the lungs daily.   Yes [provider]  Fluticasone-Umeclidin-Vilant (TRELEGY ELLIPTA) 100-62.5-25 MCG/INH AEPB Inhale 1 puff into the lungs daily.   Yes [provider]  furosemide (LASIX) 20 MG tablet Take 2 tablets by mouth daily. 11/16/18 11/24/18 Yes [provider]  OXYGEN Inhale into the lungs. 4 liters   Yes [provider]  pantoprazole (PROTONIX) 40 MG tablet Take 40 mg by mouth 2 (two) times daily.   Yes [provider]  potassium  chloride (K-DUR) 10 MEQ tablet Take 1 tablet by mouth daily. 06/08/18  Yes [provider]  PROAIR HFA 108 (90 Base) MCG/ACT inhaler Inhale 2 puffs into the lungs every 6 (six) hours as needed for shortness of breath. 11/23/18  Yes [provider]  umeclidinium bromide (INCRUSE ELLIPTA) 62.5 MCG/INH AEPB Inhale 1 puff into the lungs daily.   Yes [provider]  predniSONE (DELTASONE) 20 MG tablet 3 tabs po daily x 4 days STARTING 08/20/17 Patient not taking: Reported on 11/24/2018 08/19/17   [address], [place], [place]     Allergies:     Allergies  Allergen Reactions  . Mirtazapine     sleeplessness     Physical Exam:   Vitals  Blood pressure 116/82, pulse (!) 108, temperature 99 F (37.2 C), temperature source Oral, resp. rate (!) 21, height 5\' 7"  (1.702 m), weight 46.7 kg, SpO2 (!) 78 %.   1. General ridley frail, cachectic ill-appearing male, laying in bed in no apparent distress  2. Normal affect and insight, Not Suicidal or Homicidal, Awake Alert, Oriented X 3.  3. No F.N deficits, ALL C.Nerves Intact, Strength 5/5 all 4 extremities, Sensation intact all 4 extremities, Plantars down going.  4. Ears and Eyes appear Normal, Conjunctivae clear, PERRLA. Moist Oral Mucosa.  5. Supple Neck, No JVD, No cervical lymphadenopathy appriciated, No Carotid Bruits.  6. Symmetrical Chest wall movement, diminished air entry bilaterally, with coarse respiratory sounds in the right midlung, and scattered wheezing.  7. RRR, No Gallops, Rubs, + Murmurs, No Parasternal Heave.  8. Positive Bowel Sounds, Abdomen Soft, No tenderness, No organomegaly appriciated,No rebound -guarding or rigidity.  9.  No Cyanosis, Normal Skin Turgor, No Skin Rash or Bruise.  10.  Patient with significant muscle wasting,  joints appear normal , no effusions, Normal ROM.  11. No Palpable Lymph Nodes in Neck or Axillae    Data Review:    CBC Recent Labs  Lab 11/24/18 1105  WBC 12.8*  HGB  14.0  HCT 45.0  PLT 366  MCV 95.5  MCH 29.7  MCHC 31.1  RDW 15.2  LYMPHSABS 0.5*  MONOABS 0.5  EOSABS 0.4  BASOSABS 0.1   ------------------------------------------------------------------------------------------------------------------  Chemistries  Recent Labs  Lab 11/22/18 1515 11/24/18 1105  NA 140 138  K 4.7 4.4  CL 95* 96*  CO2 30* 31  GLUCOSE 131* 101*  BUN 20 18  CREATININE 0.76 0.82  CALCIUM 9.6 9.5  AST  --  38  ALT  --  8  ALKPHOS  --  89  BILITOT  --  1.4*   ------------------------------------------------------------------------------------------------------------------ estimated creatinine clearance is 47.5 mL/min (by C-G formula based on SCr of 0.82 mg/dL). ------------------------------------------------------------------------------------------------------------------ No results for input(s): TSH, T4TOTAL, T3FREE, THYROIDAB in the last 72 hours.  Invalid input(s): FREET3  Coagulation profile No results for input(s): INR, PROTIME in the last 168 hours. ------------------------------------------------------------------------------------------------------------------- No results for input(s): DDIMER in the last 72 hours. -------------------------------------------------------------------------------------------------------------------  Cardiac Enzymes No results for input(s): CKMB, TROPONINI, MYOGLOBIN in the last 168 hours.  Invalid input(s): CK ------------------------------------------------------------------------------------------------------------------    Component Value Date/Time   BNP 176.3 (H) 11/24/2018 1105     ---------------------------------------------------------------------------------------------------------------  Urinalysis    Component Value Date/Time   COLORURINE AMBER (A) 11/24/2018 1058   APPEARANCEUR CLEAR 11/24/2018 1058   LABSPEC 1.039 (H) 11/24/2018 1058   PHURINE 5.0 11/24/2018 1058   GLUCOSEU NEGATIVE  11/24/2018 1058   HGBUR NEGATIVE 11/24/2018 1058   BILIRUBINUR SMALL (A) 11/24/2018 1058   KETONESUR NEGATIVE 11/24/2018 1058   PROTEINUR 30 (A) 11/24/2018 1058   NITRITE NEGATIVE 11/24/2018 1058   LEUKOCYTESUR NEGATIVE 11/24/2018 1058    ----------------------------------------------------------------------------------------------------------------   Imaging Results:    Ct Chest W Contrast  Result Date: 11/24/2018 CLINICAL DATA:  Shortness of breath. COPD. Weight loss. EXAM: CT CHEST, ABDOMEN, AND PELVIS WITH CONTRAST TECHNIQUE: Multidetector CT imaging of the chest, abdomen and pelvis was performed following the standard protocol during bolus administration of intravenous contrast. CONTRAST:  100mL ISOVUE-300 IOPAMIDOL (ISOVUE-300) INJECTION 61% COMPARISON:  April 09, 2018 FINDINGS: CT CHEST FINDINGS Cardiovascular: Enlarged heart. Calcific atherosclerotic disease of the coronary arteries and aorta. Normal caliber of the great vessels. No pulmonary embolus. Mediastinum/Nodes: Partially calcified mediastinal lymph nodes, suggestive of prior granulomatous disease. No lymphadenopathy per CT criteria. Calcified nodules within the right thyroid gland. Esophagus is normal. 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 Probable mild interstitial pulmonary edema. Numerous areas of architectural distortion/scarring throughout both lungs. Left lower lobe peribronchial thickening with irregular areas of scarring versus peribronchial pulmonary nodules or consolidation. Subpleural pulmonary nodule versus prominent area of scarring in the posterior aspect of the superior segment of the right lower lobe measures approximately 2 cm. Image 54/125, sequence 4. Subpleural calcified mass in the medial right upper lobe, likely benign. Musculoskeletal: No chest wall mass or suspicious bone lesions identified. CT ABDOMEN PELVIS FINDINGS Hepatobiliary: No focal liver abnormality is seen. No  gallstones, gallbladder wall thickening, or biliary dilatation. Pancreas: Unremarkable. No pancreatic ductal dilatation or surrounding inflammatory changes. Spleen: Scattered splenic granulomata. Adrenals/Urinary Tract: Adrenal glands are unremarkable. 8 mm too small to be actually characterized hypoattenuated mass within the lower pole of the left kidney. Kidneys are otherwise normal, without renal calculi, focal lesion, or hydronephrosis. Bladder is unremarkable. Stomach/Bowel: Stomach is within normal limits. No evidence of bowel wall thickening, distention, or inflammatory changes. Diffuse colonic diverticulosis without evidence of diverticulitis. Vascular/Lymphatic: Aortic atherosclerosis. No enlarged abdominal or pelvic lymph nodes. Reproductive: Prostate is unremarkable. Bilateral hydrocele, larger on the left. Other: No abdominal wall hernia or abnormality. No abdominopelvic ascites. Musculoskeletal: No acute or significant osseous findings. Scoliosis and spondylosis of the spine. Osteopenia. IMPRESSION: 1. 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 2. Numerous areas of architectural distortion/scarring throughout both lungs. 3. Left lower lobe peribronchial thickening with irregular areas of scarring versus peribronchial pulmonary nodules or consolidation. 4. Subpleural pulmonary nodule versus prominent area of scarring in the posterior aspect of the superior segment of the  right lower lobe. Non-contrast chest CT at 3-6 months is recommended. If the nodules are stable at time of repeat CT, then future CT at 18-24 months (from today's scan) is considered optional for low-risk patients, but is recommended for high-risk patients. This recommendation follows the consensus statement: Guidelines for Management of Incidental Pulmonary Nodules Detected on CT Images: From the Fleischner Society 2017; Radiology 2017; 284:228-243. 5. Enlarged heart.  Mild interstitial pulmonary edema. 6. 8 mm too small to be  actually characterized hypoattenuated mass within the lower pole of the left kidney. 7. Diffuse colonic diverticulosis without evidence of diverticulitis. 8. Bilateral hydrocele, larger on the left. Aortic Atherosclerosis (ICD10-I70.0) and Emphysema (ICD10-J43.9). Electronically Signed   By: Ted Mcalpineobrinka  Dimitrova M.D.   On: 11/24/2018 14:44   Ct Abdomen Pelvis W Contrast  Result Date: 11/24/2018 CLINICAL DATA:  Shortness of breath. COPD. Weight loss. EXAM: CT CHEST, ABDOMEN, AND PELVIS WITH CONTRAST TECHNIQUE: Multidetector CT imaging of the chest, abdomen and pelvis was performed following the standard protocol during bolus administration of intravenous contrast. CONTRAST:  100mL ISOVUE-300 IOPAMIDOL (ISOVUE-300) INJECTION 61% COMPARISON:  April 09, 2018 FINDINGS: CT CHEST FINDINGS Cardiovascular: Enlarged heart. Calcific atherosclerotic disease of the coronary arteries and aorta. Normal caliber of the great vessels. No pulmonary embolus. Mediastinum/Nodes: Partially calcified mediastinal lymph nodes, suggestive of prior granulomatous disease. No lymphadenopathy per CT criteria. Calcified nodules within the right thyroid gland. Esophagus is normal. 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 Probable mild interstitial pulmonary edema. Numerous areas of architectural distortion/scarring throughout both lungs. Left lower lobe peribronchial thickening with irregular areas of scarring versus peribronchial pulmonary nodules or consolidation. Subpleural pulmonary nodule versus prominent area of scarring in the posterior aspect of the superior segment of the right lower lobe measures approximately 2 cm. Image 54/125, sequence 4. Subpleural calcified mass in the medial right upper lobe, likely benign. Musculoskeletal: No chest wall mass or suspicious bone lesions identified. CT ABDOMEN PELVIS FINDINGS Hepatobiliary: No focal liver abnormality is seen. No gallstones, gallbladder wall thickening,  or biliary dilatation. Pancreas: Unremarkable. No pancreatic ductal dilatation or surrounding inflammatory changes. Spleen: Scattered splenic granulomata. Adrenals/Urinary Tract: Adrenal glands are unremarkable. 8 mm too small to be actually characterized hypoattenuated mass within the lower pole of the left kidney. Kidneys are otherwise normal, without renal calculi, focal lesion, or hydronephrosis. Bladder is unremarkable. Stomach/Bowel: Stomach is within normal limits. No evidence of bowel wall thickening, distention, or inflammatory changes. Diffuse colonic diverticulosis without evidence of diverticulitis. Vascular/Lymphatic: Aortic atherosclerosis. No enlarged abdominal or pelvic lymph nodes. Reproductive: Prostate is unremarkable. Bilateral hydrocele, larger on the left. Other: No abdominal wall hernia or abnormality. No abdominopelvic ascites. Musculoskeletal: No acute or significant osseous findings. Scoliosis and spondylosis of the spine. Osteopenia. IMPRESSION: 1. 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 2. Numerous areas of architectural distortion/scarring throughout both lungs. 3. Left lower lobe peribronchial thickening with irregular areas of scarring versus peribronchial pulmonary nodules or consolidation. 4. Subpleural pulmonary nodule versus prominent area of scarring in the posterior aspect of the superior segment of the right lower lobe. Non-contrast chest CT at 3-6 months is recommended. If the nodules are stable at time of repeat CT, then future CT at 18-24 months (from today's scan) is considered optional for low-risk patients, but is recommended for high-risk patients. This recommendation follows the consensus statement: Guidelines for Management of Incidental Pulmonary Nodules Detected on CT Images: From the Fleischner Society 2017; Radiology 2017; 284:228-243. 5. Enlarged heart.  Mild interstitial pulmonary edema. 6.  8 mm too small to be actually characterized hypoattenuated  mass within the lower pole of the left kidney. 7. Diffuse colonic diverticulosis without evidence of diverticulitis. 8. Bilateral hydrocele, larger on the left. Aortic Atherosclerosis (ICD10-I70.0) and Emphysema (ICD10-J43.9). Electronically Signed   By: Ted Mcalpineobrinka  Dimitrova M.D.   On: 11/24/2018 14:44    My personal review of EKG: Rhythm NSR, Rate  100 /min, QTc 446 , no Acute ST changes   Assessment & Plan:    Active Problems:   Acute on chronic respiratory failure with hypoxia (HCC)   COPD with acute exacerbation (HCC)   Acute CHF (congestive heart failure) (HCC)   COPD exacerbation (HCC)   Chronic hypoxic respiratory failure -Appears to be at baseline, patient reports he is on 3 to 4 L nasal cannula. -Appears to be worsening this admission for massive exacerbation. -cardiology input greatly appreciated, new with current dose Lasix, plan to repeat echo to evaluate for left ventricle function and murmur.  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 -Continue with scheduled duo nebs including Brio Ellipta -[facility].  Lung nodules -We will need repeat CT in 5273-month  Failure to thrive -We will consult nutritionist and PT   DVT Prophylaxis H Lovenox - SCDs  AM Labs Ordered, also please review Full Orders  Family Communication: Admission, patients condition and plan of care including tests being ordered have been discussed with the patient who indicate understanding and agree with the plan and Code Status.  Code Status: full  Likely DC to  Home  Condition GUARDED   Consults called: [email or web address]  Admission status: observation  Time  spent in minutes : 55 minutes   Huey Bienenstockawood Lyberti Thrush M.D on 11/24/2018 at 4:05 PM  Between 7am to 7pm - Pager - [phone]. After 7pm go to [email or web address] - password [email or web address]  [facility] - Office  [phone]

## 2018-11-25 ENCOUNTER — Observation Stay (HOSPITAL_BASED_OUTPATIENT_CLINIC_OR_DEPARTMENT_OTHER): Payer: Medicare Other

## 2018-11-25 DIAGNOSIS — J441 Chronic obstructive pulmonary disease with (acute) exacerbation: Secondary | ICD-10-CM | POA: Diagnosis not present

## 2018-11-25 DIAGNOSIS — J9611 Chronic respiratory failure with hypoxia: Secondary | ICD-10-CM

## 2018-11-25 DIAGNOSIS — E43 Unspecified severe protein-calorie malnutrition: Secondary | ICD-10-CM | POA: Diagnosis not present

## 2018-11-25 DIAGNOSIS — I361 Nonrheumatic tricuspid (valve) insufficiency: Secondary | ICD-10-CM

## 2018-11-25 DIAGNOSIS — J9621 Acute and chronic respiratory failure with hypoxia: Secondary | ICD-10-CM | POA: Diagnosis not present

## 2018-11-25 LAB — BASIC METABOLIC PANEL
Anion gap: 12 (ref 5–15)
BUN: 13 mg/dL (ref 8–23)
CO2: 29 mmol/L (ref 22–32)
Calcium: 9 mg/dL (ref 8.9–10.3)
Chloride: 97 mmol/L — ABNORMAL LOW (ref 98–111)
Creatinine, Ser: 0.62 mg/dL (ref 0.61–1.24)
GFR calc Af Amer: >60 mL/min (ref >60)
GFR calc non Af Amer: >60 mL/min (ref >60)
Glucose, Bld: 132 mg/dL — ABNORMAL HIGH (ref 70–99)
Potassium: 3.8 mmol/L (ref 3.5–5.1)
Sodium: 138 mmol/L (ref 135–145)

## 2018-11-25 LAB — CBC
HCT: 38.9 % — ABNORMAL LOW (ref 39.0–52.0)
Hemoglobin: 12.4 g/dL — ABNORMAL LOW (ref 13.0–17.0)
MCH: 30.1 pg (ref 26.0–34.0)
MCHC: 31.9 g/dL (ref 30.0–36.0)
MCV: 94.4 fL (ref 80.0–100.0)
Platelets: 295 10*3/uL (ref 150–400)
RBC: 4.12 MIL/uL — ABNORMAL LOW (ref 4.22–5.81)
RDW: 15 % (ref 11.5–15.5)
WBC: 7.8 10*3/uL (ref 4.0–10.5)
nRBC: 0 % (ref 0.0–0.2)

## 2018-11-25 LAB — ECHOCARDIOGRAM COMPLETE
Height: 67 in
Weight: 1651.2 oz

## 2018-11-25 MED ORDER — ADULT MULTIVITAMIN W/MINERALS CH
1.0000 | ORAL_TABLET | Freq: Every day | ORAL | Status: DC
  Administered 2018-11-25: 1 via ORAL
  Filled 2018-11-25: qty 1

## 2018-11-25 MED ORDER — ADULT MULTIVITAMIN W/MINERALS CH: 1.0000 | ORAL_TABLET | Freq: Every day | ORAL | Status: AC

## 2018-11-25 MED ORDER — FUROSEMIDE 40 MG PO TABS: 40.0000 mg | ORAL_TABLET | Freq: Every day | ORAL | 1 refills | Status: AC

## 2018-11-25 MED ORDER — PREDNISONE 20 MG PO TABS: 20.0000 mg | ORAL_TABLET | Freq: Every day | ORAL | 0 refills | Status: DC

## 2018-11-25 MED ORDER — ENSURE ENLIVE PO LIQD: 237.0000 mL | Freq: Three times a day (TID) | ORAL | 12 refills | Status: AC

## 2018-11-25 MED ORDER — IPRATROPIUM-ALBUTEROL 0.5-2.5 (3) MG/3ML IN SOLN
3.0000 mL | Freq: Two times a day (BID) | RESPIRATORY_TRACT | Status: DC
  Filled 2018-11-25: qty 3

## 2018-11-25 NOTE — Progress Notes (Signed)
Patient is adamant about going home today, made the MD aware. Patient is educated about the importance  Of staying in hospital until properly discharged but he is persisting on leaving. Will continue to monitor.

## 2018-11-25 NOTE — Progress Notes (Signed)
Initial Nutrition Assessment  DOCUMENTATION CODES:   Severe malnutrition in context of chronic illness, Underweight  INTERVENTION:  -Liberalize diet to encourage increased po at mealtimes -Recommend addition of appetite stimulant -Ensure Enlive po BID, each supplement provides 350 kcal and 20 grams of protein -Magic cup TID with meals, each supplement provides 290 kcal and 9 grams of protein -MVI  NUTRITION DIAGNOSIS:   Severe Malnutrition related to chronic illness(COPD; 3.5L NCO2 at baseline) as evidenced by meal completion < 25%, percent weight loss, energy intake < 75% for > or equal to 3 months.   GOAL:   Patient will meet greater than or equal to 90% of their needs, Weight gain   MONITOR:   PO intake, I & O's, Labs, Supplement acceptance, Weight trends, Diet advancement  REASON FOR ASSESSMENT:   Consult Assessment of nutrition requirement/status  ASSESSMENT:  RD working remotely.  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   Spoke with patient via phone this morning who reports feeling frustrated and ready to leave. He stated that he "told his daughter that he has more to do with his life than to just sit around and wait"   He endorses ongoing poor appetite and intake for the past 3 -4 months. Patient reports that his wife prepares 3 meals/day for him and encouraged by wife to eat, but he "just doesn't feel hungry" Patient endorses taking appetite stimulant at home; unable to recall what he is prescribed. MD entered room during call; RD unable to complete dietary recall at this time.  Patient endorses recent wt loss of 19 lbs during a previous [facility] hospitalization. Per chart review pt  weighed 54 kg on 7/12 during an [place] visit.  Current wt: 46.8kg  (102.3 lb) <70% of IBW Patient with 16.7 lb wt loss 1 month (14%); severe for time frame Patient meets criteria for severe malnutrition in the context of chronic illness given pt report of 3-4 months of poor po and wt losses  Suspect patient to have severe muscle and fat depletions; Nutrition Focused Physical Exam to follow  Medications reviewed and include: ferrous sulfate, lasix, duoneb, methylprednisolone, protonix,   Labs: reviewed   NUTRITION - FOCUSED PHYSICAL EXAM: Unable to complete at this time   Diet Order:   Diet Order            Diet Heart Room service appropriate? Yes; Fluid consistency: Thin  Diet effective now              EDUCATION NEEDS:   No education needs have been identified at this time  Skin:  Skin Assessment: Reviewed RN Assessment  Last BM:  8/7  Height:   Ht Readings from Last 1 Encounters:  11/24/18 5\' 7"  (1.702 m)    Weight:   Wt Readings from Last 1 Encounters:  11/25/18 46.8 kg    Ideal Body Weight:  67.3 kg  BMI:  Body mass index is 16.16 kg/m.  Estimated Nutritional Needs:   Kcal:  1700-1900  Protein:  84-95  Fluid:  >1.7L   Lajuan Lines, RD, LDN [facility] Telephone [phone] After Hours/Weekend Pager: [phone]

## 2018-11-25 NOTE — Progress Notes (Signed)
Subjective:   Less dyspnea with oxygen on   Objective:  Vitals:   11/24/18 1943 11/24/18 2014 11/25/18 0435 11/25/18 0750  BP:  111/68 123/79 (!) 122/92  Pulse:  78 95 96  Resp:  20 20 20   Temp:  97.7 F (36.5 C) 97.8 F (36.6 C) 97.6 F (36.4 C)  TempSrc:  Oral Oral Oral  SpO2: 100%  98% 92%  Weight:   46.8 kg   Height:        Intake/Output from previous day:  Intake/Output Summary (Last 24 hours) at 11/25/2018 1884 Last data filed at 11/25/2018 0825 Gross per 24 hour  Intake 240 ml  Output 400 ml  Net -160 ml    Physical Exam:  Affect appropriate Elderly white male  HEENT: normal Neck supple with no adenopathy JVP normal no bruits no thyromegaly Lungs clear with no wheezing and good diaphragmatic motion Heart:  S1/S2 honking AS murmur, no rub, gallop or click PMI normal Abdomen: benighn, BS positve, no tenderness, no AAA no bruit.  No HSM or HJR Distal pulses intact with no bruits No edema Neuro non-focal Skin warm and dry No muscular weakness   Lab Results: Basic Metabolic Panel: Recent Labs    11/24/18 1105 11/25/18 0342  NA 138 138  K 4.4 3.8  CL 96* 97*  CO2 31 29  GLUCOSE 101* 132*  BUN 18 13  CREATININE 0.82 0.62  CALCIUM 9.5 9.0   Liver Function Tests: Recent Labs    11/24/18 1105  AST 38  ALT 8  ALKPHOS 89  BILITOT 1.4*  PROT 7.2  ALBUMIN 3.7   No results for input(s): LIPASE, AMYLASE in the last 72 hours. CBC: Recent Labs    11/24/18 1105 11/25/18 0342  WBC 12.8* 7.8  NEUTROABS 11.2*  --   HGB 14.0 12.4*  HCT 45.0 38.9*  MCV 95.5 94.4  PLT 366 295    Imaging: Ct Chest W Contrast  Result Date: 11/24/2018 CLINICAL DATA:  Shortness of breath. COPD. Weight loss. EXAM: CT CHEST, ABDOMEN, AND PELVIS WITH CONTRAST TECHNIQUE: Multidetector CT imaging of the chest, abdomen and pelvis was performed following the standard protocol during bolus administration of intravenous contrast. CONTRAST:  169mL ISOVUE-300 IOPAMIDOL  (ISOVUE-300) INJECTION 61% COMPARISON:  April 09, 2018 FINDINGS: CT CHEST FINDINGS Cardiovascular: Enlarged heart. Calcific atherosclerotic disease of the coronary arteries and aorta. Normal caliber of the great vessels. No pulmonary embolus. Mediastinum/Nodes: Partially calcified mediastinal lymph nodes, suggestive of prior granulomatous disease. No lymphadenopathy per CT criteria. Calcified nodules within the right thyroid gland. Esophagus is normal. 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 Probable mild interstitial pulmonary edema. Numerous areas of architectural distortion/scarring throughout both lungs. Left lower lobe peribronchial thickening with irregular areas of scarring versus peribronchial pulmonary nodules or consolidation. Subpleural pulmonary nodule versus prominent area of scarring in the posterior aspect of the superior segment of the right lower lobe measures approximately 2 cm. Image 54/125, sequence 4. Subpleural calcified mass in the medial right upper lobe, likely benign. Musculoskeletal: No chest wall mass or suspicious bone lesions identified. CT ABDOMEN PELVIS FINDINGS Hepatobiliary: No focal liver abnormality is seen. No gallstones, gallbladder wall thickening, or biliary dilatation. Pancreas: Unremarkable. No pancreatic ductal dilatation or surrounding inflammatory changes. Spleen: Scattered splenic granulomata. Adrenals/Urinary Tract: Adrenal glands are unremarkable. 8 mm too small to be actually characterized hypoattenuated mass within the lower pole of the left kidney. Kidneys are otherwise normal, without renal calculi, focal lesion, or hydronephrosis. Bladder  is unremarkable. Stomach/Bowel: Stomach is within normal limits. No evidence of bowel wall thickening, distention, or inflammatory changes. Diffuse colonic diverticulosis without evidence of diverticulitis. Vascular/Lymphatic: Aortic atherosclerosis. No enlarged abdominal or pelvic lymph nodes.  Reproductive: Prostate is unremarkable. Bilateral hydrocele, larger on the left. Other: No abdominal wall hernia or abnormality. No abdominopelvic ascites. Musculoskeletal: No acute or significant osseous findings. Scoliosis and spondylosis of the spine. Osteopenia. IMPRESSION: 1. 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 2. Numerous areas of architectural distortion/scarring throughout both lungs. 3. Left lower lobe peribronchial thickening with irregular areas of scarring versus peribronchial pulmonary nodules or consolidation. 4. Subpleural pulmonary nodule versus prominent area of scarring in the posterior aspect of the superior segment of the right lower lobe. Non-contrast chest CT at 3-6 months is recommended. If the nodules are stable at time of repeat CT, then future CT at 18-24 months (from today's scan) is considered optional for low-risk patients, but is recommended for high-risk patients. This recommendation follows the consensus statement: Guidelines for Management of Incidental Pulmonary Nodules Detected on CT Images: From the Fleischner Society 2017; Radiology 2017; 284:228-243. 5. Enlarged heart.  Mild interstitial pulmonary edema. 6. 8 mm too small to be actually characterized hypoattenuated mass within the lower pole of the left kidney. 7. Diffuse colonic diverticulosis without evidence of diverticulitis. 8. Bilateral hydrocele, larger on the left. Aortic Atherosclerosis (ICD10-I70.0) and Emphysema (ICD10-J43.9). Electronically Signed   By: Ted Mcalpineobrinka  Dimitrova M.D.   On: 11/24/2018 14:44   Ct Abdomen Pelvis W Contrast  Result Date: 11/24/2018 CLINICAL DATA:  Shortness of breath. COPD. Weight loss. EXAM: CT CHEST, ABDOMEN, AND PELVIS WITH CONTRAST TECHNIQUE: Multidetector CT imaging of the chest, abdomen and pelvis was performed following the standard protocol during bolus administration of intravenous contrast. CONTRAST:  100mL ISOVUE-300 IOPAMIDOL (ISOVUE-300) INJECTION 61%  COMPARISON:  April 09, 2018 FINDINGS: CT CHEST FINDINGS Cardiovascular: Enlarged heart. Calcific atherosclerotic disease of the coronary arteries and aorta. Normal caliber of the great vessels. No pulmonary embolus. Mediastinum/Nodes: Partially calcified mediastinal lymph nodes, suggestive of prior granulomatous disease. No lymphadenopathy per CT criteria. Calcified nodules within the right thyroid gland. Esophagus is normal. 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 Probable mild interstitial pulmonary edema. Numerous areas of architectural distortion/scarring throughout both lungs. Left lower lobe peribronchial thickening with irregular areas of scarring versus peribronchial pulmonary nodules or consolidation. Subpleural pulmonary nodule versus prominent area of scarring in the posterior aspect of the superior segment of the right lower lobe measures approximately 2 cm. Image 54/125, sequence 4. Subpleural calcified mass in the medial right upper lobe, likely benign. Musculoskeletal: No chest wall mass or suspicious bone lesions identified. CT ABDOMEN PELVIS FINDINGS Hepatobiliary: No focal liver abnormality is seen. No gallstones, gallbladder wall thickening, or biliary dilatation. Pancreas: Unremarkable. No pancreatic ductal dilatation or surrounding inflammatory changes. Spleen: Scattered splenic granulomata. Adrenals/Urinary Tract: Adrenal glands are unremarkable. 8 mm too small to be actually characterized hypoattenuated mass within the lower pole of the left kidney. Kidneys are otherwise normal, without renal calculi, focal lesion, or hydronephrosis. Bladder is unremarkable. Stomach/Bowel: Stomach is within normal limits. No evidence of bowel wall thickening, distention, or inflammatory changes. Diffuse colonic diverticulosis without evidence of diverticulitis. Vascular/Lymphatic: Aortic atherosclerosis. No enlarged abdominal or pelvic lymph nodes. Reproductive: Prostate is  unremarkable. Bilateral hydrocele, larger on the left. Other: No abdominal wall hernia or abnormality. No abdominopelvic ascites. Musculoskeletal: No acute or significant osseous findings. Scoliosis and spondylosis of the spine. Osteopenia. IMPRESSION: 1. Moderate to severe upper  lobe predominant emphysema. 2. Numerous areas of architectural distortion/scarring throughout both lungs. 3. Left lower lobe peribronchial thickening with irregular areas of scarring versus peribronchial pulmonary nodules or consolidation. 4. Subpleural pulmonary nodule versus prominent area of scarring in the posterior aspect of the superior segment of the right lower lobe. Non-contrast chest CT at 3-6 months is recommended. If the nodules are stable at time of repeat CT, then future CT at 18-24 months (from today's scan) is considered optional for low-risk patients, but is recommended for high-risk patients. This recommendation follows the consensus statement: Guidelines for Management of Incidental Pulmonary Nodules Detected on CT Images: From the Fleischner Society 2017; Radiology 2017; 284:228-243. 5. Enlarged heart.  Mild interstitial pulmonary edema. 6. 8 mm too small to be actually characterized hypoattenuated mass within the lower pole of the left kidney. 7. Diffuse colonic diverticulosis without evidence of diverticulitis. 8. Bilateral hydrocele, larger on the left. Aortic Atherosclerosis (ICD10-I70.0) and Emphysema (ICD10-J43.9). Electronically Signed   By: Ted Mcalpineobrinka  Dimitrova M.D.   On: 11/24/2018 14:44    Cardiac Studies:  ECG: SR nonspecific ST changes    Telemetry:  NSR 11/25/2018   Echo: pending   Medications:   . albuterol  2.5 mg Nebulization BID  . feeding supplement (ENSURE ENLIVE)  237 mL Oral TID BM  . ferrous sulfate  325 mg Oral Q breakfast  . fluticasone furoate-vilanterol  1 puff Inhalation Daily  . furosemide  40 mg Oral Daily  . heparin  5,000 Units Subcutaneous Q8H  . ipratropium  0.5 mg  Nebulization BID  . methylPREDNISolone (SOLU-MEDROL) injection  40 mg Intravenous Q12H  . pantoprazole  40 mg Oral BID  . potassium chloride  10 mEq Oral Daily  . umeclidinium bromide  1 puff Inhalation Daily      Assessment/Plan:   Dyspnea:  Seemed to be mostly pulmonary with chronic hypoxic respiratory failure on 3-4 L oxygen usually  CT with no infiltrate severe emphysema BNP only 176 Has significant AS murmur but echo outside institution suggested only mild AS with mean gradient 11 mmHg Repeat echo pending from today Continue  Low dose lasix   Charlton HawsPeter Rubena Roseman 11/25/2018, 8:42 AM

## 2018-11-25 NOTE — Progress Notes (Signed)
  Echocardiogram 2D Echocardiogram has been performed.  Maurice Garrison 11/25/2018, 1:52 PM

## 2018-11-25 NOTE — Progress Notes (Signed)
   Vital Signs MEWS/VS Documentation      11/25/2018 0750 11/25/2018 0946 11/25/2018 0958 11/25/2018 1302   MEWS Score:  0  -  0  1   MEWS Score Color:  Green  -  Green  Green   Resp:  20  -  -  20   Pulse:  96  -  100  95   BP:  (!) 122/92  -  -  99/66   Temp:  97.6 F (36.4 C)  -  -  98 F (36.7 C)   O2 Device:  Nasal Cannula  Nasal Cannula  -  Nasal Cannula   O2 Flow Rate (L/min):  -  4 L/min  4 L/min  4 L/min           Caryl Pina N Tobias-Diakun 11/25/2018,2:16 PM

## 2018-11-25 NOTE — Evaluation (Signed)
Physical Therapy Evaluation Patient Details Name: Maurice Garrison MRN: [id number] DOB: 01/09/1941 Today's Date: 11/25/2018   History of Present Illness  78 yo admitted with SOB, FTT and CHF exacerbation. PMhx: COPD on home O2, HOH  Clinical Impression  Pt pleasant and reports being on 4.5L at home for many years. Pt reports he does not change his FiO2 and does not own a pulse oximeter. Pt with sats 92% at rest on 4L but even with use of 2 different pulse ox monitors unable to successfully obtain SpO2 during activity with pt reporting no SOB. Pt encouraged to use cane at home due to need for stabilizing force with gait with tendency to reach for furniture. Pt with decreased activity tolerance, balance and mobility who will benefit from acute therapy to maximize independence for return home.   spO2 92% on 4L with questionable drop to 76% but unable to obtain or maintain pulse ox reading throughout session HR 100    Follow Up Recommendations No PT follow up    Equipment Recommendations  Cane    Recommendations for Other Services       Precautions / Restrictions Precautions Precautions: Fall      Mobility  Bed Mobility Overal bed mobility: Modified Independent             General bed mobility comments: pt able to transition to EOB with HOB 20 degrees and no rail  Transfers Overall transfer level: Modified independent                  Ambulation/Gait Ambulation/Gait assistance: Min guard Gait Distance (Feet): 250 Feet Assistive device: None Gait Pattern/deviations: Step-through pattern;Decreased stride length   Gait velocity interpretation: 1.31 - 2.62 ft/sec, indicative of limited community ambulator General Gait Details: pt with generally steady gait with 3 periods of reaching for environmental support. Pt reports he tends to furniture walk at home and refused attempting RW  Stairs Stairs: Yes Stairs assistance: Modified independent (Device/Increase time) Stair  Management: Two rails;Alternating pattern;Forwards Number of Stairs: 3 General stair comments: pt reliant on support of rail but able to ascend/descend steps to simulate home entry  Wheelchair Mobility    Modified Rankin (Stroke Patients Only)       Balance Overall balance assessment: Needs assistance   Sitting balance-Leahy Scale: Good       Standing balance-Leahy Scale: Good Standing balance comment: pt able to maintain standing and limited gait without UE support                             Pertinent Vitals/Pain Pain Assessment: No/denies pain    Home Living Family/patient expects to be discharged to:: Private residence Living Arrangements: Spouse/significant other Available Help at Discharge: Available 24 hours/day Type of Home: House       Home Layout: Two level;Laundry or work area in [place]: Environmental consultant - 2 wheels      Prior Function Level of Independence: Independent               Hand Dominance        Extremity/Trunk Assessment   Upper Extremity Assessment Upper Extremity Assessment: Generalized weakness    Lower Extremity Assessment Lower Extremity Assessment: Generalized weakness    Cervical / Trunk Assessment Cervical / Trunk Assessment: Kyphotic  Communication   Communication: No difficulties  Cognition Arousal/Alertness: Awake/alert Behavior During Therapy: WFL for tasks assessed/performed Overall Cognitive Status: Within Functional Limits for tasks assessed  General Comments      Exercises     Assessment/Plan    PT Assessment Patient needs continued PT services  PT Problem List Decreased mobility;Decreased activity tolerance;Decreased balance       PT Treatment Interventions Gait training;Therapeutic exercise;Patient/family education;Functional mobility training;Balance training;Therapeutic activities;DME instruction    PT Goals (Current  goals can be found in the Care Plan section)  Acute Rehab PT Goals Patient Stated Goal: return home PT Goal Formulation: With patient Time For Goal Achievement: 12/02/18 Potential to Achieve Goals: Good    Frequency Min 3X/week   Barriers to discharge        Co-evaluation               AM-PAC PT "6 Clicks" Mobility  Outcome Measure Help needed turning from your back to your side while in a flat bed without using bedrails?: None Help needed moving from lying on your back to sitting on the side of a flat bed without using bedrails?: None Help needed moving to and from a bed to a chair (including a wheelchair)?: None Help needed standing up from a chair using your arms (e.g., wheelchair or bedside chair)?: A Little Help needed to walk in hospital room?: A Little Help needed climbing 3-5 steps with a railing? : A Little 6 Click Score: 21    End of Session Equipment Utilized During Treatment: Gait belt Activity Tolerance: Patient tolerated treatment well Patient left: in chair;with call bell/phone within reach Nurse Communication: Mobility status PT Visit Diagnosis: Other abnormalities of gait and mobility (R26.89);Unsteadiness on feet (R26.81)    Time: [id number] PT Time Calculation (min) (ACUTE ONLY): 27 min   Charges:   PT Evaluation $PT Eval Moderate Complexity: 1 Mod PT Treatments $Gait Training: 8-22 mins        Maurice Garrison, PT Acute Rehabilitation Services Pager: [phone] Office: [phone]   Philip Eckersley B Timberly Yott 11/25/2018, 1:36 PM

## 2018-11-25 NOTE — Progress Notes (Signed)
Discharge instructions given to patient and all questions and concerns are answered and addressed. Patient took his breathing treatments home.

## 2018-11-25 NOTE — Discharge Summary (Signed)
Physician Discharge Summary  Maurice Garrison:[id number] DOB: [birth year] DOA: 11/24/2018  PCP: [name], MD  Admit date: 11/24/2018 Discharge date: 11/25/2018  Time spent: 50 minutes  Recommendations for Outpatient Follow-up:  1. Follow-up with [name], MD in 2 weeks.  On follow-up patient's chronic hypoxic respiratory failure/COPD will need to be followed up upon.  Patient will need repeat CT chest done in 3 months to follow-up on pulmonary nodules noted.   Discharge Diagnoses:  Active Problems:   Acute on chronic respiratory failure with hypoxia (HCC)   COPD with acute exacerbation (HCC)   Acute CHF (congestive heart failure) (HCC)   COPD exacerbation (HCC)   Protein-calorie malnutrition, severe   Discharge Condition: Stable  Diet recommendation: Regular  Filed Weights   11/24/18 1039 11/24/18 1801 11/25/18 0435  Weight: 46.7 kg 45.1 kg 46.8 kg    History of present illness:  HPI per Dr. Melvyn Garrison  is a 78 y.o. 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 -Patient was sent by cardiology clinic for evaluation for dyspnea, and failure to thrive, overall patient with dyspnea, which appears to be chronic, but has been worsening over the last few weeks, as well patient has been having worsening appetite, weight loss, patient with progressive dyspnea, mainly upon exertion, is with known history of advanced COPD, but remains at baseline 3.5 L nasal cannula, denies any fever, chills, hemoptysis, coffee-ground emesis. -In ED patient was noted to be extremely frail and cachectic, he was at baseline with 3.5 L nasal cannula, CT chest/abdomen was performed which  was significant for small pulmonary nodules, and severe emphysema, patient was seen by cardiology who recommended to obtain echo for further work-up, I was called to admit. Hospital Course:  1 chronic hypoxic respiratory failure/COPD exacerbation Patient noted at baseline to be on 3 to 4 L nasal cannula.  Patient sent by cardiology office for evaluation of dyspnea and failure to thrive.  Patient's dyspnea noted to be chronic.  Patient with history of advanced COPD however remains at baseline 3.5 L nasal cannula.  Patient seen in the ED CT chest abdomen and pelvis performed showed small pulmonary nodules, severe emphysema.  Patient seen by cardiology who recommended echo for further work-up.  Patient was placed on IV Solu-Medrol, scheduled duo nebs.  Patient remained in stable condition.  Patient did state had no significant change from his chronic shortness of breath.  2D echo was obtained per cardiology which had a normal systolic function with a EF of 60 to 65%, no regional wall motion abnormalities, normal right ventricular systolic function, right ventricular systolic pressure moderately elevated at 49 mmHg, poorly visualized AV with some degree of thickening and calcification of aortic valve cups, left ventricular diastolic Doppler parameters consistent with impaired relaxation.  Patient remained in stable condition.  It was felt per cardiology patient was stable for discharge.  Patient was discharged home on a steroid taper.  Outpatient follow-up with PCP and cardiology.  2.  Pulmonary nodules Will need repeat chest CT in 3 months on follow-up.  3.  Failure to thrive Patient was seen that by dietitian during the hospitalization.  Patient's diet was liberalized to  a regular diet.  Outpatient follow-up with PCP.  Procedures:  2D echo 11/25/2018  CT chest 11/24/2018  CT abdomen and pelvis 11/24/2018  Consultations:  Cardiology: Dr. Johnsie Cancel 11/24/2018  Discharge Exam: Vitals:   11/25/18 1545  11/25/18 1550  BP:    Pulse:    Resp: (!) 21   Temp:    SpO2: 92% 100%    General: NAD Cardiovascular: RRR Respiratory: Clear to auscultation bilaterally.Marland Kitchen  No crackles.  No rhonchi.  Discharge Instructions   Discharge Instructions    Diet - low sodium heart healthy   Complete by: As directed    Increase activity slowly   Complete by: As directed      Allergies as of 11/25/2018      Reactions   Mirtazapine    sleeplessness      Medication List    TAKE these medications   albuterol (2.5 MG/3ML) 0.083% nebulizer solution Commonly known as: PROVENTIL Inhale 3 mLs into the lungs every 6 (six) hours as needed for wheezing.   ProAir HFA 108 (90 Base) MCG/ACT inhaler Generic drug: albuterol Inhale 2 puffs into the lungs every 6 (six) hours as needed for shortness of breath.   Breo Ellipta 100-25 MCG/INH Aepb Generic drug: fluticasone furoate-vilanterol Inhale 1 puff into the lungs daily.   feeding supplement (ENSURE ENLIVE) Liqd Take 237 mLs by mouth 3 (three) times daily between meals.   ferrous sulfate 325 (65 FE) MG tablet Take 325 mg by mouth daily with breakfast.   furosemide 40 MG tablet Commonly known as: LASIX Take 1 tablet (40 mg total) by mouth daily. What changed: medication strength   Incruse Ellipta 62.5 MCG/INH Aepb Generic drug: umeclidinium bromide Inhale 1 puff into the lungs daily.   multivitamin with minerals Tabs tablet Take 1 tablet by mouth daily. Start taking on: November 26, 2018   OXYGEN Inhale into the lungs. 4 liters   pantoprazole 40 MG tablet Commonly known as: PROTONIX Take 40 mg by mouth 2 (two) times daily.   potassium chloride 10 MEQ tablet Commonly known as: K-DUR Take 1 tablet by mouth daily.   predniSONE 20 MG tablet Commonly known as: DELTASONE Take 1-2 tablets (20-40 mg total) by mouth daily with breakfast. Take 2 tablets (40mg ) daily x 3 days, then 1 tablet (20mg ) daily x 3 days then stop. What changed:   how  much to take  how to take this  when to take this  additional instructions   Trelegy Ellipta 100-62.5-25 MCG/INH Aepb Generic drug: Fluticasone-Umeclidin-Vilant Inhale 1 puff into the lungs daily.      Allergies  Allergen Reactions  . Mirtazapine     sleeplessness   Follow-up Information    Karleen Hampshire., MD. Schedule an appointment as soon as possible for a visit in 2 week(s).   Specialty: Internal Medicine Contact information: [address] [place] [address] [phone]        pulmonary Follow up in 2 week(s).            The results of significant diagnostics from this hospitalization (including imaging, microbiology, ancillary and laboratory) are listed below for reference.    Significant Diagnostic Studies: Ct Chest W Contrast  Result Date: 11/24/2018 CLINICAL DATA:  Shortness of breath. COPD. Weight loss. EXAM: CT CHEST, ABDOMEN, AND PELVIS WITH CONTRAST TECHNIQUE: Multidetector CT imaging of the chest, abdomen and pelvis was performed following the standard protocol during bolus administration of intravenous contrast. CONTRAST:  113mL ISOVUE-300 IOPAMIDOL (ISOVUE-300)  INJECTION 61% COMPARISON:  April 09, 2018 FINDINGS: CT CHEST FINDINGS Cardiovascular: Enlarged heart. Calcific atherosclerotic disease of the coronary arteries and aorta. Normal caliber of the great vessels. No pulmonary embolus. Mediastinum/Nodes: Partially calcified mediastinal lymph nodes, suggestive of prior granulomatous disease. No lymphadenopathy per CT criteria. Calcified nodules within the right thyroid gland. Esophagus is normal. 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 Probable mild interstitial pulmonary edema. Numerous areas of architectural distortion/scarring throughout both lungs. Left lower lobe peribronchial thickening with irregular areas of scarring versus peribronchial pulmonary nodules or consolidation. Subpleural pulmonary nodule versus  prominent area of scarring in the posterior aspect of the superior segment of the right lower lobe measures approximately 2 cm. Image 54/125, sequence 4. Subpleural calcified mass in the medial right upper lobe, likely benign. Musculoskeletal: No chest wall mass or suspicious bone lesions identified. CT ABDOMEN PELVIS FINDINGS Hepatobiliary: No focal liver abnormality is seen. No gallstones, gallbladder wall thickening, or biliary dilatation. Pancreas: Unremarkable. No pancreatic ductal dilatation or surrounding inflammatory changes. Spleen: Scattered splenic granulomata. Adrenals/Urinary Tract: Adrenal glands are unremarkable. 8 mm too small to be actually characterized hypoattenuated mass within the lower pole of the left kidney. Kidneys are otherwise normal, without renal calculi, focal lesion, or hydronephrosis. Bladder is unremarkable. Stomach/Bowel: Stomach is within normal limits. No evidence of bowel wall thickening, distention, or inflammatory changes. Diffuse colonic diverticulosis without evidence of diverticulitis. Vascular/Lymphatic: Aortic atherosclerosis. No enlarged abdominal or pelvic lymph nodes. Reproductive: Prostate is unremarkable. Bilateral hydrocele, larger on the left. Other: No abdominal wall hernia or abnormality. No abdominopelvic ascites. Musculoskeletal: No acute or significant osseous findings. Scoliosis and spondylosis of the spine. Osteopenia. IMPRESSION: 1. 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 2. Numerous areas of architectural distortion/scarring throughout both lungs. 3. Left lower lobe peribronchial thickening with irregular areas of scarring versus peribronchial pulmonary nodules or consolidation. 4. Subpleural pulmonary nodule versus prominent area of scarring in the posterior aspect of the superior segment of the right lower lobe. Non-contrast chest CT at 3-6 months is recommended. If the nodules are stable at time of repeat CT, then future CT at 18-24 months  (from today's scan) is considered optional for low-risk patients, but is recommended for high-risk patients. This recommendation follows the consensus statement: Guidelines for Management of Incidental Pulmonary Nodules Detected on CT Images: From the Fleischner Society 2017; Radiology 2017; 284:228-243. 5. Enlarged heart.  Mild interstitial pulmonary edema. 6. 8 mm too small to be actually characterized hypoattenuated mass within the lower pole of the left kidney. 7. Diffuse colonic diverticulosis without evidence of diverticulitis. 8. Bilateral hydrocele, larger on the left. Aortic Atherosclerosis (ICD10-I70.0) and Emphysema (ICD10-J43.9). Electronically Signed   By: Ted Mcalpineobrinka  Dimitrova M.D.   On: 11/24/2018 14:44   Ct Abdomen Pelvis W Contrast  Result Date: 11/24/2018 CLINICAL DATA:  Shortness of breath. COPD. Weight loss. EXAM: CT CHEST, ABDOMEN, AND PELVIS WITH CONTRAST TECHNIQUE: Multidetector CT imaging of the chest, abdomen and pelvis was performed following the standard protocol during bolus administration of intravenous contrast. CONTRAST:  100mL ISOVUE-300 IOPAMIDOL (ISOVUE-300) INJECTION 61% COMPARISON:  April 09, 2018 FINDINGS: CT CHEST FINDINGS Cardiovascular: Enlarged heart. Calcific atherosclerotic disease of the coronary arteries and aorta. Normal caliber of the great vessels. No pulmonary embolus. Mediastinum/Nodes: Partially calcified mediastinal lymph nodes, suggestive of prior granulomatous disease. No lymphadenopathy per CT criteria. Calcified nodules within the right thyroid gland. Esophagus is normal. 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 Probable mild interstitial pulmonary edema. Numerous areas of architectural distortion/scarring  throughout both lungs. Left lower lobe peribronchial thickening with irregular areas of scarring versus peribronchial pulmonary nodules or consolidation. Subpleural pulmonary nodule versus prominent area of scarring in the  posterior aspect of the superior segment of the right lower lobe measures approximately 2 cm. Image 54/125, sequence 4. Subpleural calcified mass in the medial right upper lobe, likely benign. Musculoskeletal: No chest wall mass or suspicious bone lesions identified. CT ABDOMEN PELVIS FINDINGS Hepatobiliary: No focal liver abnormality is seen. No gallstones, gallbladder wall thickening, or biliary dilatation. Pancreas: Unremarkable. No pancreatic ductal dilatation or surrounding inflammatory changes. Spleen: Scattered splenic granulomata. Adrenals/Urinary Tract: Adrenal glands are unremarkable. 8 mm too small to be actually characterized hypoattenuated mass within the lower pole of the left kidney. Kidneys are otherwise normal, without renal calculi, focal lesion, or hydronephrosis. Bladder is unremarkable. Stomach/Bowel: Stomach is within normal limits. No evidence of bowel wall thickening, distention, or inflammatory changes. Diffuse colonic diverticulosis without evidence of diverticulitis. Vascular/Lymphatic: Aortic atherosclerosis. No enlarged abdominal or pelvic lymph nodes. Reproductive: Prostate is unremarkable. Bilateral hydrocele, larger on the left. Other: No abdominal wall hernia or abnormality. No abdominopelvic ascites. Musculoskeletal: No acute or significant osseous findings. Scoliosis and spondylosis of the spine. Osteopenia. IMPRESSION: 1. 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 2. Numerous areas of architectural distortion/scarring throughout both lungs. 3. Left lower lobe peribronchial thickening with irregular areas of scarring versus peribronchial pulmonary nodules or consolidation. 4. Subpleural pulmonary nodule versus prominent area of scarring in the posterior aspect of the superior segment of the right lower lobe. Non-contrast chest CT at 3-6 months is recommended. If the nodules are stable at time of repeat CT, then future CT at 18-24 months (from today's scan) is considered  optional for low-risk patients, but is recommended for high-risk patients. This recommendation follows the consensus statement: Guidelines for Management of Incidental Pulmonary Nodules Detected on CT Images: From the Fleischner Society 2017; Radiology 2017; 284:228-243. 5. Enlarged heart.  Mild interstitial pulmonary edema. 6. 8 mm too small to be actually characterized hypoattenuated mass within the lower pole of the left kidney. 7. Diffuse colonic diverticulosis without evidence of diverticulitis. 8. Bilateral hydrocele, larger on the left. Aortic Atherosclerosis (ICD10-I70.0) and Emphysema (ICD10-J43.9). Electronically Signed   By: Ted Mcalpineobrinka  Dimitrova M.D.   On: 11/24/2018 14:44    Microbiology: Recent Results (from the past 240 hour(s))  SARS Coronavirus 2 [facility](Hospital order, Performed in [facility] hospital lab) Nasopharyngeal Nasopharyngeal Swab     Status: None   Collection Time: 11/24/18 11:47 AM   Specimen: Nasopharyngeal Swab  Result Value Ref Range Status   SARS Coronavirus 2 NEGATIVE NEGATIVE Final    Comment: (NOTE) If result is NEGATIVE SARS-CoV-2 target nucleic acids are NOT DETECTED. The SARS-CoV-2 RNA is generally detectable in upper and lower  respiratory specimens during the acute phase of infection. The lowest  concentration of SARS-CoV-2 viral copies this assay can detect is 250  copies / mL. A negative result does not preclude SARS-CoV-2 infection  and should not be used as the sole basis for treatment or other  patient management decisions.  A negative result may occur with  improper specimen collection / handling, submission of specimen other  than nasopharyngeal swab, presence of viral mutation(s) within the  areas targeted by this assay, and inadequate number of viral copies  (<250 copies / mL). A negative result must be combined with clinical  observations, patient history, and epidemiological information. If result is POSITIVE SARS-CoV-2 target nucleic acids are  DETECTED. The  SARS-CoV-2 RNA is generally detectable in upper and lower  respiratory specimens dur ing the acute phase of infection.  Positive  results are indicative of active infection with SARS-CoV-2.  Clinical  correlation with patient history and other diagnostic information is  necessary to determine patient infection status.  Positive results do  not rule out bacterial infection or co-infection with other viruses. If result is PRESUMPTIVE POSTIVE SARS-CoV-2 nucleic acids MAY BE PRESENT.   A presumptive positive result was obtained on the submitted specimen  and confirmed on repeat testing.  While 2019 novel coronavirus  (SARS-CoV-2) nucleic acids may be present in the submitted sample  additional confirmatory testing may be necessary for epidemiological  and / or clinical management purposes  to differentiate between  SARS-CoV-2 and other Sarbecovirus currently known to infect humans.  If clinically indicated additional testing with an alternate test  methodology [phone]([id number]) is advised. The SARS-CoV-2 RNA is generally  detectable in upper and lower respiratory sp ecimens during the acute  phase of infection. The expected result is Negative. Fact Sheet for Patients:  BoilerBrush.com.cyhttps://www.fda.gov/media/136312/download Fact Sheet for Healthcare Providers: https://pope.com/https://www.fda.gov/media/136313/download This test is not yet approved or cleared by the [place] FDA and has been authorized for detection and/or diagnosis of SARS-CoV-2 by FDA under an Emergency Use Authorization (EUA).  This EUA will remain in effect (meaning this test can be used) for the duration of the COVID-19 declaration under Section 564(b)(1) of the Act, 21 U.S.C. section 360bbb-3(b)(1), unless the authorization is terminated or revoked sooner. Performed at [facility], [address] [place], [place] [address]      Labs: Basic Metabolic Panel: Recent Labs  Lab 11/22/18 1515 11/24/18 1105 11/25/18 0342   NA 140 138 138  K 4.7 4.4 3.8  CL 95* 96* 97*  CO2 30* 31 29  GLUCOSE 131* 101* 132*  BUN 20 18 13   CREATININE 0.76 0.82 0.62  CALCIUM 9.6 9.5 9.0   Liver Function Tests: Recent Labs  Lab 11/24/18 1105  AST 38  ALT 8  ALKPHOS 89  BILITOT 1.4*  PROT 7.2  ALBUMIN 3.7   No results for input(s): LIPASE, AMYLASE in the last 168 hours. No results for input(s): AMMONIA in the last 168 hours. CBC: Recent Labs  Lab 11/24/18 1105 11/25/18 0342  WBC 12.8* 7.8  NEUTROABS 11.2*  --   HGB 14.0 12.4*  HCT 45.0 38.9*  MCV 95.5 94.4  PLT 366 295   Cardiac Enzymes: No results for input(s): CKTOTAL, CKMB, CKMBINDEX, TROPONINI in the last 168 hours. BNP: BNP (last 3 results) Recent Labs    11/24/18 1105  BNP 176.3*    ProBNP (last 3 results) No results for input(s): PROBNP in the last 8760 hours.  CBG: No results for input(s): GLUCAP in the last 168 hours.     Signed:  Ramiro Harvestaniel Thompson MD.  [facility] 11/25/2018, 3:59 PM

## 2018-11-27 ENCOUNTER — Other Ambulatory Visit: Payer: Self-pay

## 2018-11-27 ENCOUNTER — Inpatient Hospital Stay: Admission: RE | Admit: 2018-11-27 | Payer: Medicare Other | Source: Ambulatory Visit

## 2018-11-27 ENCOUNTER — Telehealth (HOSPITAL_COMMUNITY): Payer: Self-pay

## 2018-11-27 ENCOUNTER — Ambulatory Visit (HOSPITAL_COMMUNITY): Payer: Medicare Other

## 2018-11-27 ENCOUNTER — Encounter (HOSPITAL_COMMUNITY): Payer: Self-pay

## 2018-11-27 NOTE — Telephone Encounter (Signed)
New message   Call for clarification at the request of echo tech due to the patient had an echocardiogram on 8.8.20 and the patient was scheduled Monday 10, 2020 at  11:30.  Spoke with CMA Levada Dy Truitt advised canceling the echo appointment today.

## 2018-12-05 ENCOUNTER — Ambulatory Visit (HOSPITAL_COMMUNITY)
Admission: RE | Admit: 2018-12-05 | Discharge: 2018-12-05 | Disposition: A | Discharge status: Home / Self Care | Payer: Medicare Other | Source: Ambulatory Visit | Attending: Cardiovascular Disease | Admitting: Cardiovascular Disease

## 2018-12-05 ENCOUNTER — Encounter (HOSPITAL_COMMUNITY): Payer: Medicare Other

## 2018-12-05 ENCOUNTER — Other Ambulatory Visit: Payer: Self-pay

## 2018-12-05 DIAGNOSIS — R0989 Other specified symptoms and signs involving the circulatory and respiratory systems: Secondary | ICD-10-CM | POA: Diagnosis not present

## 2018-12-07 ENCOUNTER — Telehealth: Payer: Self-pay | Admitting: Adult Health

## 2018-12-07 NOTE — Telephone Encounter (Signed)
Daughter returned this office call stated she is looking at the results on Mychart and call her if you need her they will keep the pt's next appointment.

## 2018-12-07 NOTE — Telephone Encounter (Signed)
Spoke with pt, aware of doppler results. Questions regarding appointment answered.

## 2018-12-10 ENCOUNTER — Emergency Department (HOSPITAL_COMMUNITY): Payer: Medicare Other

## 2018-12-10 ENCOUNTER — Other Ambulatory Visit: Payer: Self-pay

## 2018-12-10 ENCOUNTER — Emergency Department (HOSPITAL_COMMUNITY)
Admission: EM | Admit: 2018-12-10 | Discharge: 2018-12-10 | Disposition: A | Discharge status: Home / Self Care | Payer: Medicare Other | Attending: Emergency Medicine | Admitting: Emergency Medicine

## 2018-12-10 DIAGNOSIS — J449 Chronic obstructive pulmonary disease, unspecified: Secondary | ICD-10-CM | POA: Insufficient documentation

## 2018-12-10 DIAGNOSIS — Z87891 Personal history of nicotine dependence: Secondary | ICD-10-CM | POA: Diagnosis not present

## 2018-12-10 DIAGNOSIS — Z79899 Other long term (current) drug therapy: Secondary | ICD-10-CM | POA: Insufficient documentation

## 2018-12-10 DIAGNOSIS — X58XXXD Exposure to other specified factors, subsequent encounter: Secondary | ICD-10-CM | POA: Insufficient documentation

## 2018-12-10 DIAGNOSIS — S22060A Wedge compression fracture of T7-T8 vertebra, initial encounter for closed fracture: Secondary | ICD-10-CM

## 2018-12-10 DIAGNOSIS — S22060D Wedge compression fracture of T7-T8 vertebra, subsequent encounter for fracture with routine healing: Secondary | ICD-10-CM | POA: Insufficient documentation

## 2018-12-10 DIAGNOSIS — R0602 Shortness of breath: Secondary | ICD-10-CM | POA: Diagnosis present

## 2018-12-10 LAB — CBC
HCT: 41.8 % (ref 39.0–52.0)
Hemoglobin: 13.4 g/dL (ref 13.0–17.0)
MCH: 30.8 pg (ref 26.0–34.0)
MCHC: 32.1 g/dL (ref 30.0–36.0)
MCV: 96.1 fL (ref 80.0–100.0)
Platelets: 342 10*3/uL (ref 150–400)
RBC: 4.35 MIL/uL (ref 4.22–5.81)
RDW: 15.9 % — ABNORMAL HIGH (ref 11.5–15.5)
WBC: 16.3 10*3/uL — ABNORMAL HIGH (ref 4.0–10.5)
nRBC: 0 % (ref 0.0–0.2)

## 2018-12-10 LAB — BASIC METABOLIC PANEL
Anion gap: 11 (ref 5–15)
BUN: 25 mg/dL — ABNORMAL HIGH (ref 8–23)
CO2: 31 mmol/L (ref 22–32)
Calcium: 9.5 mg/dL (ref 8.9–10.3)
Chloride: 96 mmol/L — ABNORMAL LOW (ref 98–111)
Creatinine, Ser: 0.83 mg/dL (ref 0.61–1.24)
GFR calc Af Amer: >60 mL/min (ref >60)
GFR calc non Af Amer: >60 mL/min (ref >60)
Glucose, Bld: 93 mg/dL (ref 70–99)
Potassium: 4.8 mmol/L (ref 3.5–5.1)
Sodium: 138 mmol/L (ref 135–145)

## 2018-12-10 MED ORDER — FENTANYL CITRATE (PF) 100 MCG/2ML IJ SOLN
50.0000 ug | Freq: Once | INTRAMUSCULAR | Status: DC
  Filled 2018-12-10: qty 2

## 2018-12-10 MED ORDER — HYDROCODONE-ACETAMINOPHEN 5-325 MG PO TABS: 1.0000 | ORAL_TABLET | Freq: Four times a day (QID) | ORAL | 0 refills | Status: DC | PRN

## 2018-12-10 MED ORDER — HYDROCODONE-ACETAMINOPHEN 5-325 MG PO TABS
1.0000 | ORAL_TABLET | Freq: Once | ORAL | Status: AC
  Administered 2018-12-10: 1 via ORAL
  Filled 2018-12-10: qty 1

## 2018-12-10 NOTE — ED Triage Notes (Signed)
Pt presents from home. Reports sob and pain related to COPD and CHF. Has started a new pain medication on Friday with pulmonologist that has not been helping, "the pain makes the shob worse which makes the pain worse."  Pt was hospitalization this month for CHF and COPD exacerbation.

## 2018-12-10 NOTE — ED Provider Notes (Signed)
[facility] Provider Note   CSN: [id number] Arrival date & time: 12/10/18  1857     History   Chief Complaint No chief complaint on file.   HPI Maurice Garrison Swickard is a 78 y.o. male.     The history is provided by the patient, a relative and medical records. No language interpreter was used.   Maurice Garrison Dyckman is a 78 y.o. male  with a PMH of COPD, CHF who presents to the Emergency Department complaining of midthoracic back pain.  Patient and family members state that this has been present for a couple of months, but acutely worsened on Friday (2 days ago).  It hurts so much that he is having difficulty with his breathing.  He was seen by his primary care doctor on Friday who started him on gabapentin.  They noticed some relief, but it is still significantly painful and he has having a tough time managing the pain at home.  He denies any chest pain.  No abdominal pain, nausea or vomiting.  No urinary symptoms.  No fevers.   Past Medical History:  Diagnosis Date  . COPD (chronic obstructive pulmonary disease) (HCC)   . HOH (hard of hearing)     Patient Active Problem List   Diagnosis Date Noted  . Protein-calorie malnutrition, severe 11/25/2018  . Acute on chronic respiratory failure with hypoxia (HCC) 11/24/2018  . COPD with acute exacerbation (HCC) 11/24/2018  . Acute CHF (congestive heart failure) (HCC) 11/24/2018  . COPD exacerbation (HCC) 11/24/2018    Past Surgical History:  Procedure Laterality Date  . APPENDECTOMY    . HERNIA REPAIR          Home Medications    Prior to Admission medications   Medication Sig Start Date End Date Taking? Authorizing Provider  albuterol (PROVENTIL) (2.5 MG/3ML) 0.083% nebulizer solution Inhale 3 mLs into the lungs every 6 (six) hours as needed for wheezing. 11/23/18   [provider]  feeding supplement, ENSURE ENLIVE, (ENSURE ENLIVE) LIQD Take 237 mLs by mouth 3 (three) times daily  between meals. 11/25/18   Rodolph Bonghompson, Daniel V, MD  ferrous sulfate 325 (65 FE) MG tablet Take 325 mg by mouth daily with breakfast.    [provider]  fluticasone furoate-vilanterol (BREO ELLIPTA) 100-25 MCG/INH AEPB Inhale 1 puff into the lungs daily.    [provider]  Fluticasone-Umeclidin-Vilant (TRELEGY ELLIPTA) 100-62.5-25 MCG/INH AEPB Inhale 1 puff into the lungs daily.    [provider]  furosemide (LASIX) 40 MG tablet Take 1 tablet (40 mg total) by mouth daily. 11/25/18   Rodolph Bonghompson, Daniel V, MD  HYDROcodone-acetaminophen (NORCO) 5-325 MG tablet Take 1 tablet by mouth every 6 (six) hours as needed for moderate pain. 12/10/18   Ward, Chase PicketJaime Pilcher, PA-C  Multiple Vitamin (MULTIVITAMIN WITH MINERALS) TABS tablet Take 1 tablet by mouth daily. 11/26/18   Rodolph Bonghompson, Daniel V, MD  OXYGEN Inhale into the lungs. 4 liters    [provider]  pantoprazole (PROTONIX) 40 MG tablet Take 40 mg by mouth 2 (two) times daily.    [provider]  potassium chloride (K-DUR) 10 MEQ tablet Take 1 tablet by mouth daily. 06/08/18   [provider]  predniSONE (DELTASONE) 20 MG tablet Take 1-2 tablets (20-40 mg total) by mouth daily with breakfast. Take 2 tablets (40mg ) daily x 3 days, then 1 tablet (20mg ) daily x 3 days then stop. 11/25/18   Rodolph Bonghompson, Daniel V, MD  [facility]  108 (90 Base) MCG/ACT inhaler Inhale 2 puffs into the lungs every 6 (six) hours as needed for shortness of breath. 11/23/18   [provider]  umeclidinium bromide (INCRUSE ELLIPTA) 62.5 MCG/INH AEPB Inhale 1 puff into the lungs daily.    [provider]    Family History No family history on file.  Social History Social History   Tobacco Use  . Smoking status: Former Research scientist (life sciences)  . Smokeless tobacco: Never Used  Substance Use Topics  . Alcohol use: Never    Frequency: Never  . Drug use: Never     Allergies   Mirtazapine   Review of Systems Review of Systems   Respiratory: Positive for shortness of breath (Difficulty with breathing 2/2 back pain).   Musculoskeletal: Positive for back pain.  All other systems reviewed and are negative.    Physical Exam Updated Vital Signs BP (!) 144/98   Pulse (!) 101   Temp 97.9 F (36.6 C) (Oral)   Resp (!) 22   SpO2 100%   Physical Exam Vitals signs and nursing note reviewed.  Constitutional:      General: He is not in acute distress.    Appearance: He is well-developed.  HENT:     Head: Normocephalic and atraumatic.  Neck:     Musculoskeletal: Neck supple.  Cardiovascular:     Rate and Rhythm: Normal rate and regular rhythm.     Heart sounds: Normal heart sounds. No murmur.  Pulmonary:     Effort: Pulmonary effort is normal. No respiratory distress.     Breath sounds: Normal breath sounds.     Comments: Lungs clear. Abdominal:     General: There is no distension.     Palpations: Abdomen is soft.     Comments: No abdominal tenderness.  Musculoskeletal:       Back:  Skin:    General: Skin is warm and dry.  Neurological:     Mental Status: He is alert and oriented to person, place, and time.     Comments: All 4 extremities neurovascularly intact.      ED Treatments / Results  Labs (all labs ordered are listed, but only abnormal results are displayed) Labs Reviewed  BASIC METABOLIC PANEL - Abnormal; Notable for the following components:      Result Value   Chloride 96 (*)    BUN 25 (*)    All other components within normal limits  CBC - Abnormal; Notable for the following components:   WBC 16.3 (*)    RDW 15.9 (*)    All other components within normal limits    EKG None  Radiology Dg Chest 2 View  Result Date: 12/10/2018 CLINICAL DATA:  Pain between the shoulder blades. EXAM: CHEST - 2 VIEW COMPARISON:  Chest radiographs, 11/08/2018, CT chest, 04/09/2018. FINDINGS: The heart size and mediastinal contours are within normal limits. Emphysema, chronic scarring, and nodular  airspace disease are similar in appearance to prior CT and radiographs. Osteopenia with an exaggerated thoracic kyphosis and a wedge deformity of approximately T7 which is new when compared to prior radiographs and CT. IMPRESSION: 1. Emphysema, chronic scarring, and nodular airspace disease are similar in appearance to prior CT and radiographs. There is no obvious new airspace opacity. 2. Osteopenia with an exaggerated thoracic kyphosis and a wedge deformity of approximately T7 which is new when compared to prior radiographs and CT. Correlate for acute point tenderness. Electronically Signed   By: Eddie Candle M.D.   On: 12/10/2018  21:33    Procedures Procedures (including critical care time)  Medications Ordered in ED Medications  fentaNYL (SUBLIMAZE) injection 50 mcg (0 mcg Intravenous Hold 12/10/18 2140)  HYDROcodone-acetaminophen (NORCO/VICODIN) 5-325 MG per tablet 1 tablet (1 tablet Oral Given 12/10/18 2233)     Initial Impression / Assessment and Plan / ED Course  I have reviewed the triage vital signs and the nursing notes.  Pertinent labs & imaging results that were available during my care of the patient were reviewed by me and considered in my medical decision making (see chart for details).       Maurice Garrison Haze is a 78 y.o. male who presents to ED for acute worsening of his chronic back pain on Friday.  Per family, his pain was so intense that it was hurting to breathe, and they could see him struggling a great deal.  With him which was started by his primary care doctor 2 days ago with little improvement.  Plain film shows new wedge compression fracture approximately T7 which correlates clinically with his pain.  We will give him a short course of pain control and have him follow-up with his primary care doctor.  Patient and family at bedside understand reasons to return to ER and all questions were answered.  Patient discussed with Dr. Donnald GarrePfeiffer who agrees with treatment plan.     Final Clinical Impressions(s) / ED Diagnoses   Final diagnoses:  Shortness of breath  Closed wedge compression fracture of seventh thoracic vertebra, initial encounter [facility](HCC)    ED Discharge Orders         Ordered    HYDROcodone-acetaminophen (NORCO) 5-325 MG tablet  Every 6 hours PRN     12/10/18 2223           Ward, Chase PicketJaime Pilcher, PA-C 12/10/18 2242    Arby BarrettePfeiffer, Marcy, MD 12/27/18 [phone]

## 2018-12-10 NOTE — Discharge Instructions (Signed)
It was my pleasure taking care of you today!   I have given you a prescription for a short course of pain medication. You can use this in addition to the Gabapentin.   Call your primary care doctor in the morning to schedule a follow up appointment.   Return to ER for new or worsening symptoms, any additional concerns.

## 2018-12-16 NOTE — Progress Notes (Signed)
Virtual Visit via Video Note   This visit type was conducted due to national recommendations for restrictions regarding the COVID-19 Pandemic (e.g. social distancing) in an effort to limit this patient's exposure and mitigate transmission in our community.  Due to his co-morbid illnesses, this patient is at least at moderate risk for complications without adequate follow up.  This format is felt to be most appropriate for this patient at this time.  All issues noted in this document were discussed and addressed.  A limited physical exam was performed with this format.  Please refer to the patient's chart for his consent to telehealth for [facility].   Date:  12/16/2018   ID:  Maurice Garrison, DOB 1940/09/19, MRN [id number]  Patient Location: Home Provider Location: Home  PCP:  [name], MD  Cardiologist:  Dr. Jacques Navy  Electrophysiologist:  None   Evaluation Performed:  Follow-Up Visit  Chief Complaint:  Follow up Hospital   History of Present Illness:    Maurice Garrison is a 78 y.o. male we are following for ongoing assessment and management of chronic CHF, with history of COPD on O2, and 3.5 L, protein calorie malnutrition, who was recently admitted to [facility] in the setting of acute on chronic respiratory failure with hypoxia on 11/24/2018.  In the ED he was found to be extremely frail and cachectic, with chest CT and abdomen performed which was significant for pulmonary nodules and severe emphysema  Echocardiogram was completed which revealed an EF of 60% to 65% with no regional wall motion abnormalities, normal right ventricular systolic function, right ventricular systolic pressure moderately elevated at 49 mmHg, poorly visualized AV with some degree of thickening and calcification of the aortic valve cusps.  Doppler parameters were consistent with impaired relaxation.  The patient was discharged on a steroid taper with outpatient follow-up with primary care and cardiology.  He did  meet with dietitian during hospitalization who recommended high-protein diet.  He was again seen in the emergency room on 12/10/2018 with complaints of severe back pain.  He had been seen by his primary care physician earlier that week and was started on gabapentin and he had noticed some relief but he was still having a good bit of pain.  He denied any chest pain or abdominal pain.  He was found to have a new wedge compression fracture approximately T7 which correlated clinically with his pain.  He was given a short course of pain control and was to follow-up with primary care physician for ongoing management.  He remains frail and basically bedridden from lumbar and thoracic fracture. Remains on O2.  Has lost more weight. Family is ready to have Hospice involved for  Home hospice care.  I spoke with the patient who is in agreement with this plan, does not want intubation or heroic measures, just wants to be kept comfortable.   The patient does not have symptoms concerning for COVID-19 infection (fever, chills, cough, or new shortness of breath).    Past Medical History:  Diagnosis Date   COPD (chronic obstructive pulmonary disease) (HCC)    HOH (hard of hearing)    Past Surgical History:  Procedure Laterality Date   APPENDECTOMY     HERNIA REPAIR       No outpatient medications have been marked as taking for the 12/18/18 encounter (Appointment) with Jodelle Gross, NP.     Allergies:   Mirtazapine   Social History   Tobacco Use   Smoking  status: Former Smoker   Smokeless tobacco: Never Used  Substance Use Topics   Alcohol use: Never    Frequency: Never   Drug use: Never     Family Hx: The patient's family history is not on file.  ROS:   Please see the history of present illness.    All other systems reviewed and are negative.   Prior CV studies:   The following studies were reviewed today: Echocardiogram 11/25/2018 1. The left ventricle has normal systolic  function with an ejection fraction of 60-65%. The cavity size was normal. Left ventricular diastolic Doppler parameters are consistent with impaired relaxation. Elevated left ventricular end-diastolic pressure  No evidence of left ventricular regional wall motion abnormalities.  2. There is a calcified mobile density in the mid LV cavity that likely represents a calcified papillary muscle.  3. The right ventricle has normal systolic function. The cavity was normal. There is no increase in right ventricular wall thickness. Right ventricular systolic pressure is moderately elevated at 49mmHg.  4. The mitral valve was not well visualized. Mild thickening of the mitral valve leaflet. Mild calcification of the mitral valve leaflet including the chordae tendinea. There is severe mitral annular calcification present  5. Poorly visualized AV. There is some degree of thickening and calcification of the aortic valve cups but cannot comment further due to poor acoustical windows.  6. The aorta is normal in size and structure.  ABI's 12/05/2018 Summary: Right: Resting right ankle-brachial index is within normal range. No evidence of significant right lower extremity arterial disease. The right toe-brachial index is abnormal.  Left: Resting left ankle-brachial index is within normal range. No evidence of significant left lower extremity arterial disease.  The left great toe PPG was undetectable.   Labs/Other Tests and Data Reviewed:    EKG:  No ECG reviewed.  Recent Labs: 11/24/2018: ALT 8; B Natriuretic Peptide 176.3 12/10/2018: BUN 25; Creatinine, Ser 0.83; Hemoglobin 13.4; Platelets 342; Potassium 4.8; Sodium 138   Recent Lipid Panel No results found for: CHOL, TRIG, HDL, CHOLHDL, LDLCALC, LDLDIRECT  Wt Readings from Last 3 Encounters:  11/25/18 103 lb 3.2 oz (46.8 kg)  11/22/18 103 lb (46.7 kg)  11/20/18 109 lb (49.4 kg)     Objective:    Vital Signs:  There were no vitals taken for this  visit.   VITAL SIGNS:  reviewed GEN:  Frail, cachetic RESPIRATORY:  Wearing O2, with unlabored breathing  SKIN:  Pale NEURO:  alert and oriented x 3, no obvious focal deficit PSYCH:  normal affect  ASSESSMENT & PLAN:    1. End Stage COPD: Patient is deteriorating. Long conversation with daugther and his wife, along with the Mr. [name].  He does not want to have intubation and is signing papers for DNR. Family is now asking for referral for Home Hospice. We will send referral and notify PCP of their decision to move forward with their decision. We have sent a referral to Hospice today.  I have also called Dr. Barnett AbuFurr's office to inform her that this is taking place so that she can be involved.   2.Chronic Diastolic CHF: He appears to be stable with this currently. No changes in medications.   3. Thoracic and Lumbar Fx.: Causing significant pain. Defer to Hospice for assistance.   COVID-19 Education: The signs and symptoms of COVID-19 were discussed with the patient and how to seek care for testing (follow up with PCP or arrange E-visit).The importance of social distancing was discussed today.  Time:   Today, I have spent 20  minutes with the patient with telehealth technology discussing the above problems.     Medication Adjustments/Labs and Tests Ordered: Current medicines are reviewed at length with the patient today.  Concerns regarding medicines are outlined above.   Tests Ordered: No orders of the defined types were placed in this encounter.   Medication Changes: No orders of the defined types were placed in this encounter.   Disposition:  Follow up prn    Signed, Phill Myron. West Pugh, ANP, AACC  12/16/2018 4:06 PM    [facility]

## 2018-12-18 ENCOUNTER — Telehealth (INDEPENDENT_AMBULATORY_CARE_PROVIDER_SITE_OTHER): Payer: Medicare Other | Admitting: Adult Health

## 2018-12-18 ENCOUNTER — Encounter: Payer: Self-pay | Admitting: Adult Health

## 2018-12-18 VITALS — Ht 67.0 in | Wt 105.4 lb

## 2018-12-18 DIAGNOSIS — J449 Chronic obstructive pulmonary disease, unspecified: Secondary | ICD-10-CM | POA: Diagnosis not present

## 2018-12-18 DIAGNOSIS — I5032 Chronic diastolic (congestive) heart failure: Secondary | ICD-10-CM

## 2018-12-18 NOTE — Patient Instructions (Signed)
Medication Instructions:  Continue current medications  If you need a refill on your cardiac medications before your next appointment, please call your pharmacy.  Labwork: None Ordered   Testing/Procedures: None Ordered  Follow-Up: . You have been referred to Hospice care   At [facility], you and your health needs are our priority.  As part of our continuing mission to provide you with exceptional heart care, we have created designated Provider Care Teams.  These Care Teams include your primary Cardiologist (physician) and Advanced Practice Providers (APPs -  Physician Assistants and Nurse Practitioners) who all work together to provide you with the care you need, when you need it.  Thank you for choosing [facility] at [facility]!!

## 2018-12-28 ENCOUNTER — Telehealth: Payer: Self-pay | Admitting: Adult Health

## 2018-12-28 NOTE — Telephone Encounter (Signed)
New message   Patient's daughter states that the patient passed away and needs for the death certificate to be signed by Belenda Cruise before the patient can be cremated. The [place] home that has his body Is [address] on [address]. Please call to discuss.

## 2018-12-29 NOTE — Telephone Encounter (Signed)
Waiting for medical records.  They will receive and have it signed.

## 2019-01-18 DEATH — deceased

## 2021-06-08 IMAGING — CT CT ABDOMEN AND PELVIS WITH CONTRAST
2 of 7 series · 13 of 46 positions shown, 15 images · IV contrast (Omni 300)
Comparison: April 09, 2018

CLINICAL DATA: Shortness of breath. COPD. Weight loss.

EXAM:
CT CHEST, ABDOMEN, AND PELVIS WITH CONTRAST
TECHNIQUE: Multidetector CT imaging of the chest, abdomen and pelvis was
performed following the standard protocol during bolus
administration of intravenous contrast.
CONTRAST:  100mL [id number] IOPAMIDOL (K69L13-7FF) INJECTION 61%

[Series 3: cap with 5mm st · axial · 0.89mm/px · z∈[-215,+325]mm · 10 of 133 slices shown, 12 images]
[im 13/133  soft-tissue]
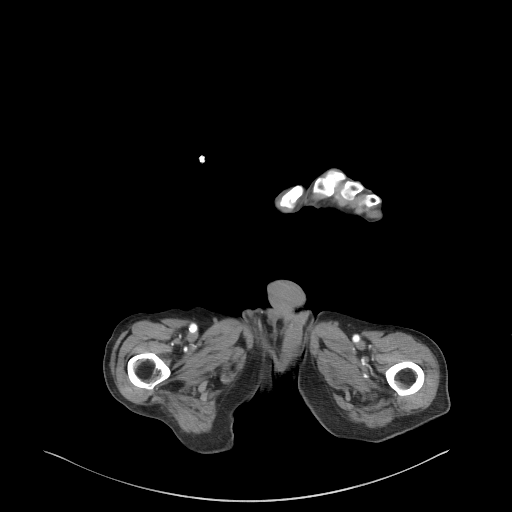
[im 13/133  bone]
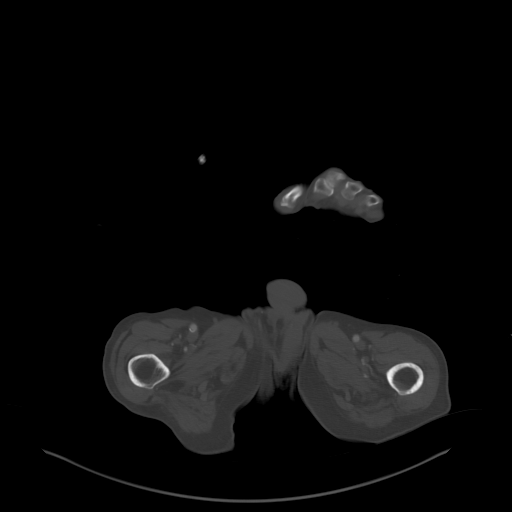
[im 25/133  soft-tissue]
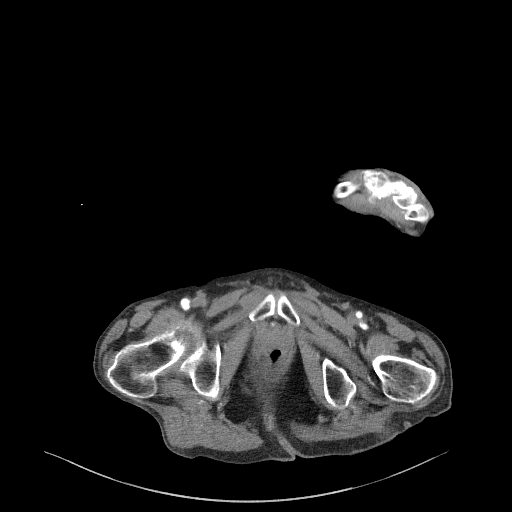
[im 37/133  soft-tissue]
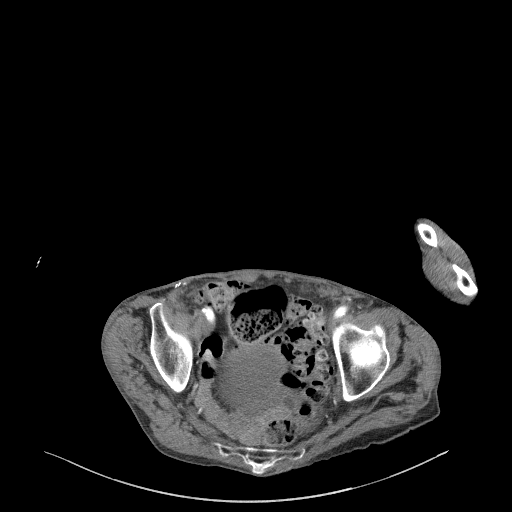
[im 49/133  soft-tissue]
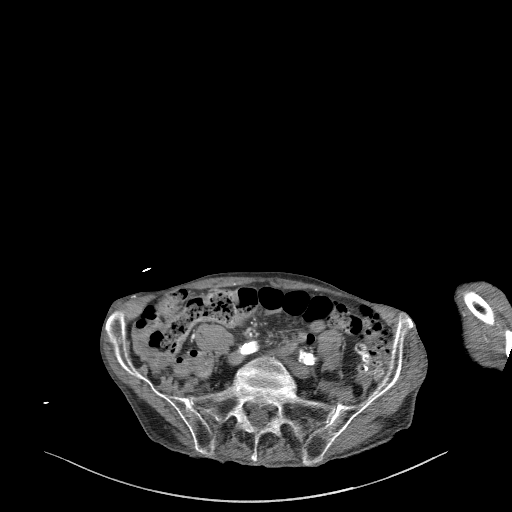
[im 61/133  soft-tissue]
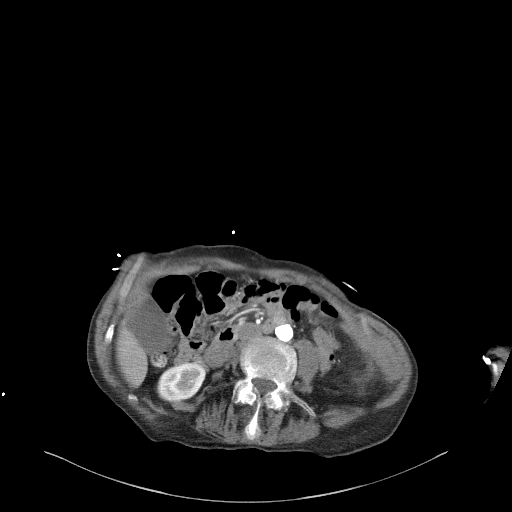
[im 73/133  soft-tissue]
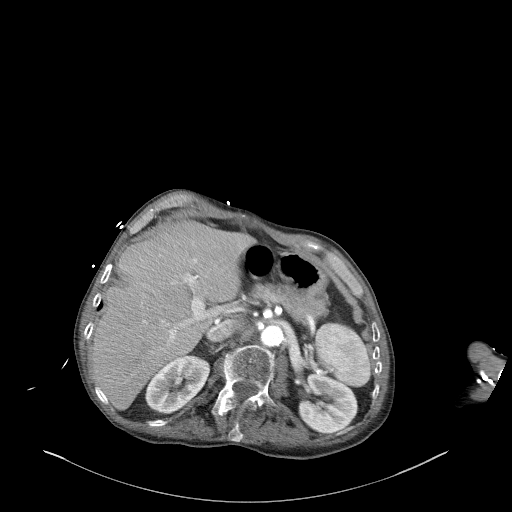
[im 85/133  soft-tissue]
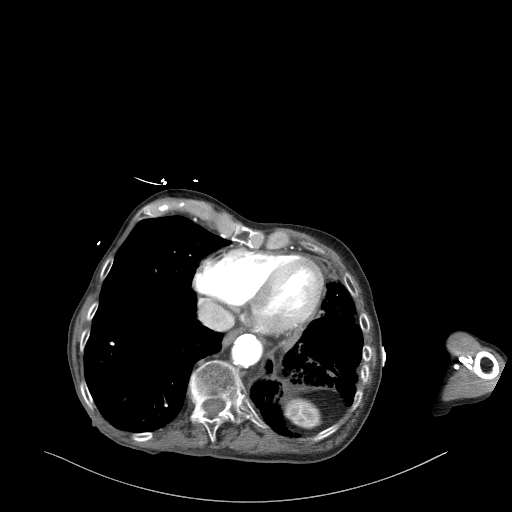
[im 97/133  soft-tissue]
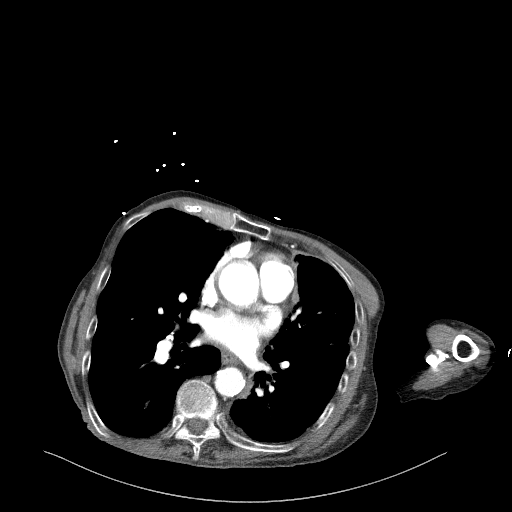
[im 109/133  soft-tissue]
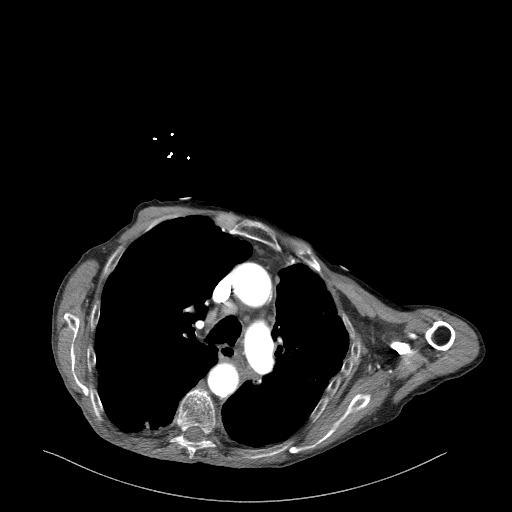
[im 109/133  bone]
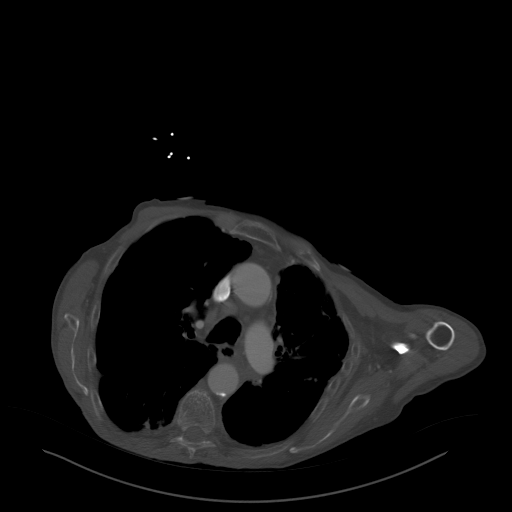
[im 121/133  soft-tissue]
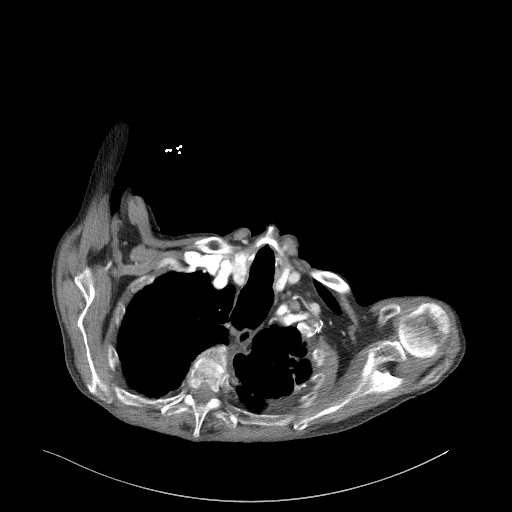

[Series 5: cap with 3mm st cor · coronal · 0.63mm/px · 3 of 150 slices shown]
[im 38/150  soft-tissue]
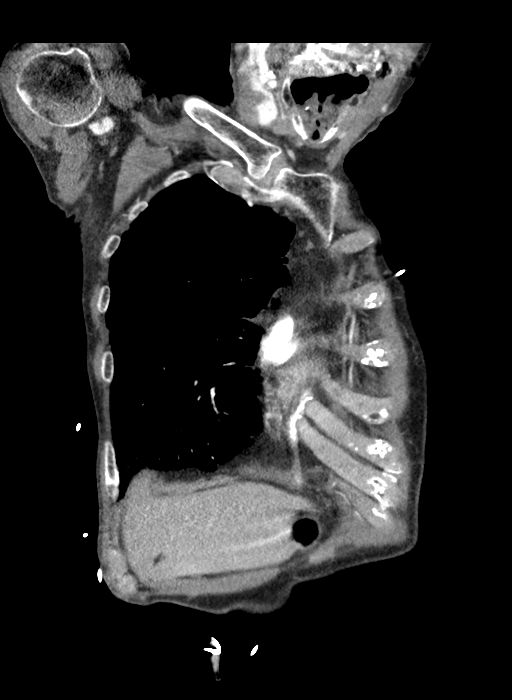
[im 75/150  soft-tissue]
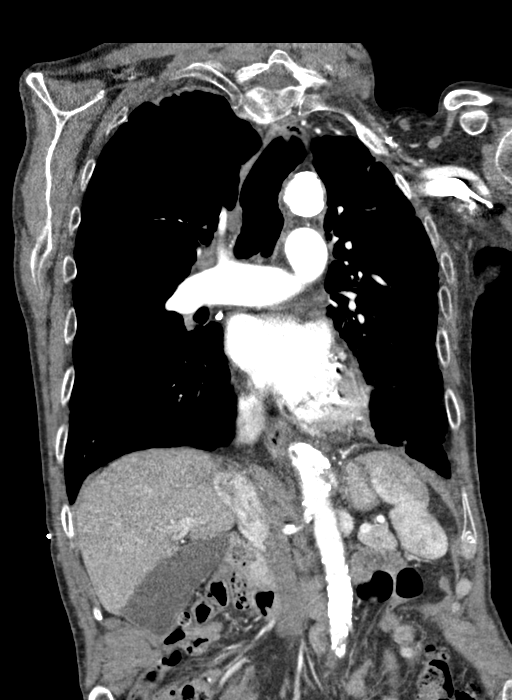
[im 112/150  soft-tissue]
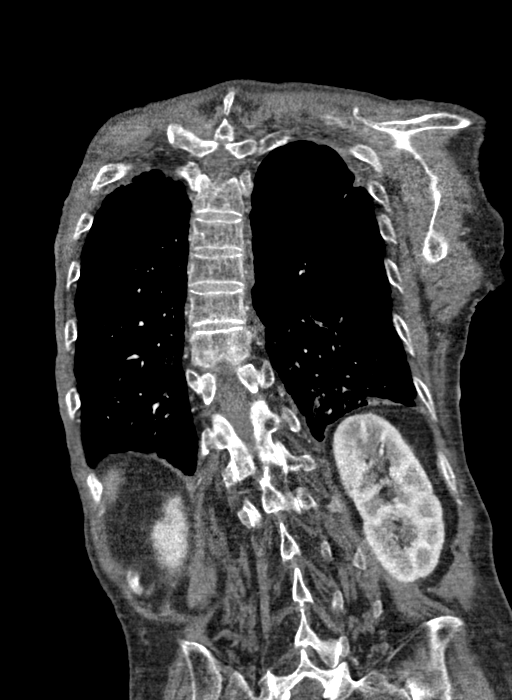

[13 of 46 positions shown; findings below may reference images not displayed]

FINDINGS: CT CHEST FINDINGS

Cardiovascular: Enlarged heart. Calcific atherosclerotic disease of
the coronary arteries and aorta. Normal caliber of the great
vessels. No pulmonary embolus.

Mediastinum/Nodes: Partially calcified mediastinal lymph nodes,
suggestive of prior granulomatous disease. No lymphadenopathy per CT
criteria. Calcified nodules within the right thyroid gland.
Esophagus is normal.

Lungs/Pleura: Moderate to severe upper lobe predominant emphysema.
Probable mild interstitial pulmonary edema. Numerous areas of
architectural distortion/scarring throughout both lungs. Left lower
lobe peribronchial thickening with irregular areas of scarring
versus peribronchial pulmonary nodules or consolidation. Subpleural
pulmonary nodule versus prominent area of scarring in the posterior
aspect of the superior segment of the right lower lobe measures
approximately 2 cm. Image 54/125, sequence 4. Subpleural calcified
mass in the medial right upper lobe, likely benign.

Musculoskeletal: No chest wall mass or suspicious bone lesions
identified.

CT ABDOMEN PELVIS FINDINGS

Hepatobiliary: No focal liver abnormality is seen. No gallstones,
gallbladder wall thickening, or biliary dilatation.

Pancreas: Unremarkable. No pancreatic ductal dilatation or
surrounding inflammatory changes.

Spleen: Scattered splenic granulomata.

Adrenals/Urinary Tract: Adrenal glands are unremarkable. 8 mm too
small to be actually characterized hypoattenuated mass within the
lower pole of the left kidney. Kidneys are otherwise normal, without
renal calculi, focal lesion, or hydronephrosis. Bladder is
unremarkable.

Stomach/Bowel: Stomach is within normal limits. No evidence of bowel
wall thickening, distention, or inflammatory changes. Diffuse
colonic diverticulosis without evidence of diverticulitis.

Vascular/Lymphatic: Aortic atherosclerosis. No enlarged abdominal or
pelvic lymph nodes.

Reproductive: Prostate is unremarkable. Bilateral hydrocele, larger
on the left.

Other: No abdominal wall hernia or abnormality. No abdominopelvic
ascites.

Musculoskeletal: No acute or significant osseous findings. Scoliosis
and spondylosis of the spine. Osteopenia.
IMPRESSION: 1. Moderate to severe upper lobe predominant emphysema.
2. Numerous areas of architectural distortion/scarring throughout
both lungs.
3. Left lower lobe peribronchial thickening with irregular areas of
scarring versus peribronchial pulmonary nodules or consolidation.
4. Subpleural pulmonary nodule versus prominent area of scarring in
the posterior aspect of the superior segment of the right lower
lobe. Non-contrast chest CT at 3-6 months is recommended. If the
nodules are stable at time of repeat CT, then future CT at 18-24
months (from today's scan) is considered optional for low-risk
patients, but is recommended for high-risk patients. This
recommendation follows the consensus statement: Guidelines for
Management of Incidental Pulmonary Nodules Detected on CT Images:
5. Enlarged heart.  Mild interstitial pulmonary edema.
6. 8 mm too small to be actually characterized hypoattenuated mass
within the lower pole of the left kidney.
7. Diffuse colonic diverticulosis without evidence of
diverticulitis.
8. Bilateral hydrocele, larger on the left.

Aortic Atherosclerosis (N7M6D-2BV.V) and Emphysema (N7M6D-AJ0.P).

## 2021-06-24 IMAGING — DX CHEST - 2 VIEW
2 series · 2 of 2 positions shown · non-contrast
Comparison: Chest radiographs, 11/08/2018, CT chest, 04/09/2018.

CLINICAL DATA: Pain between the shoulder blades.

EXAM:
CHEST - 2 VIEW

[chest lat]
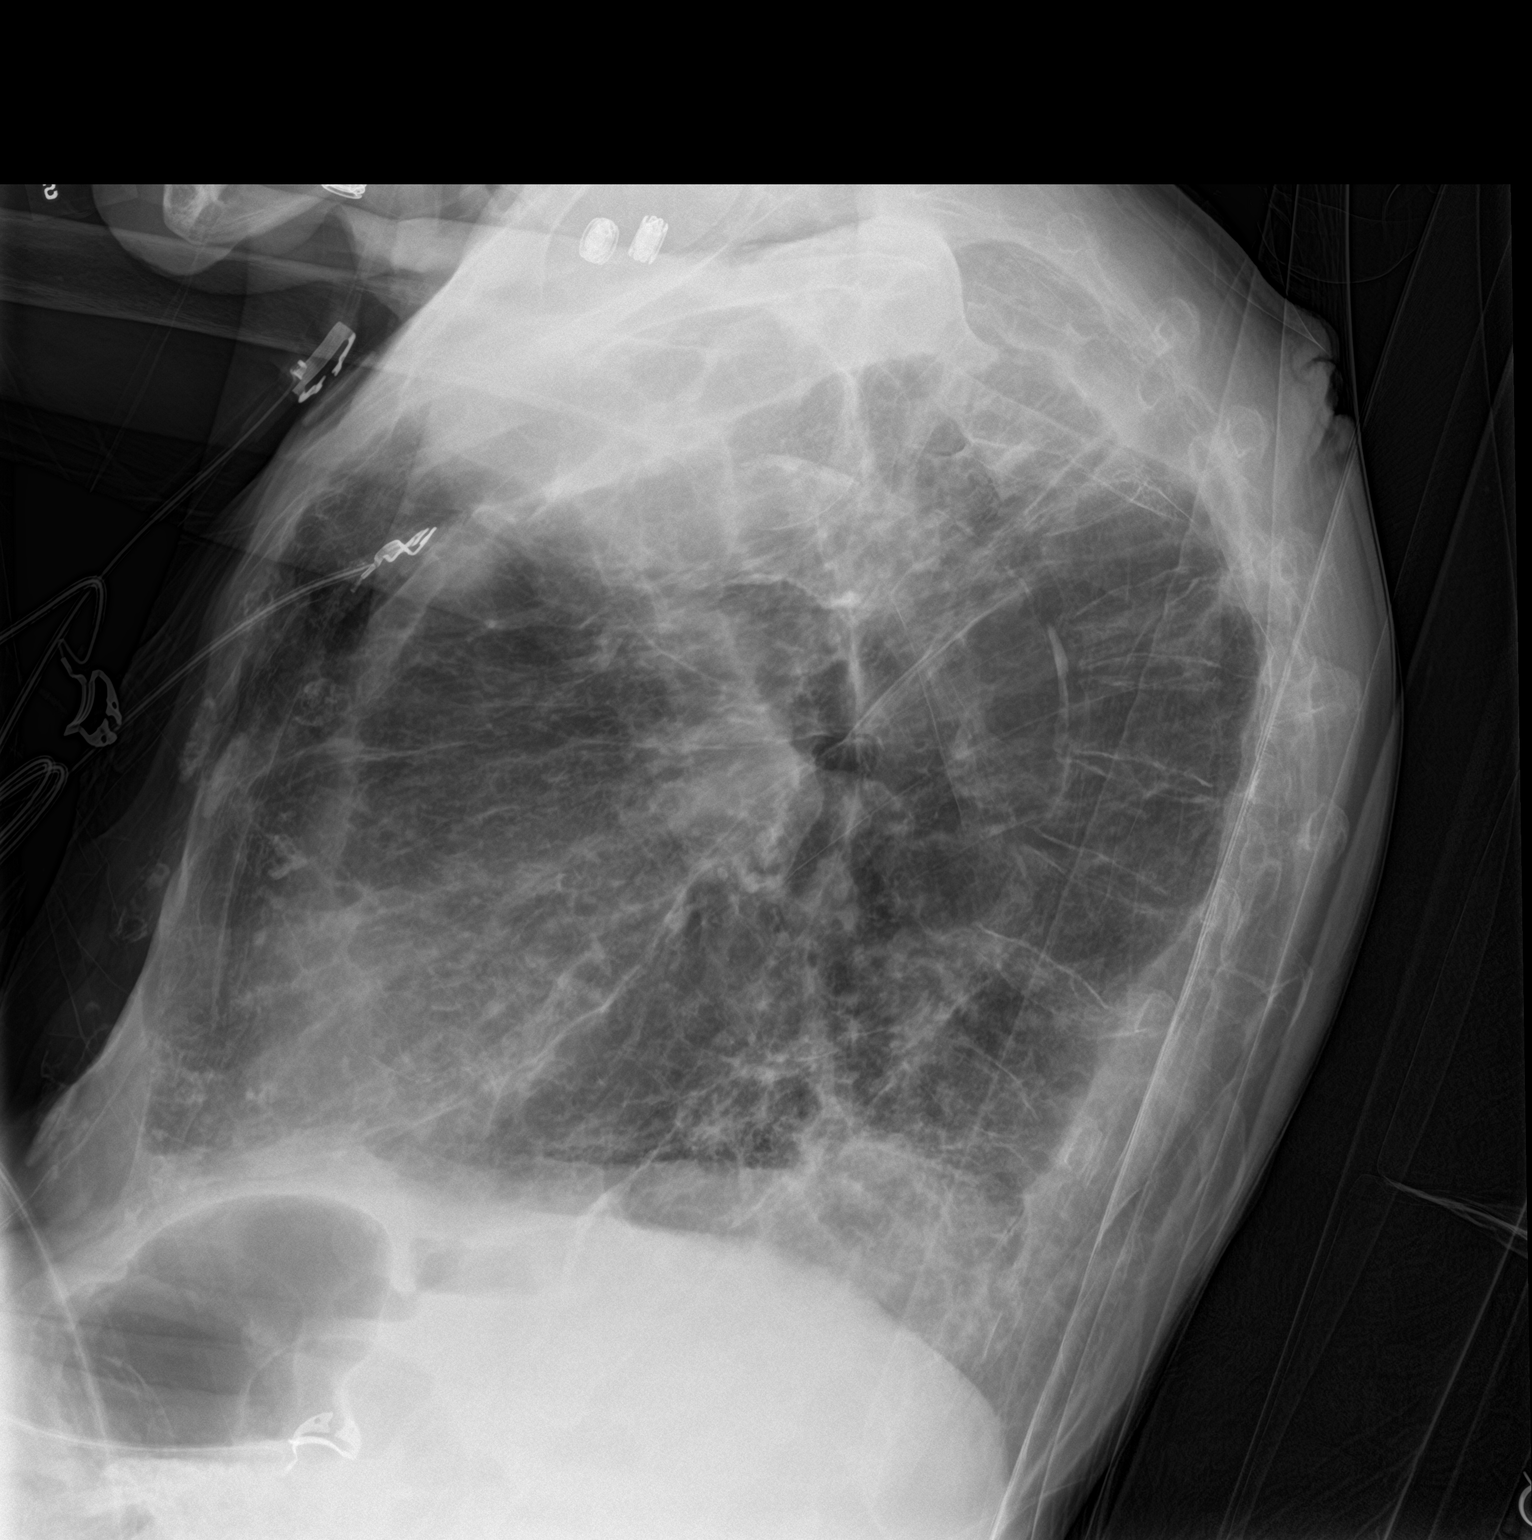

[chest ap]
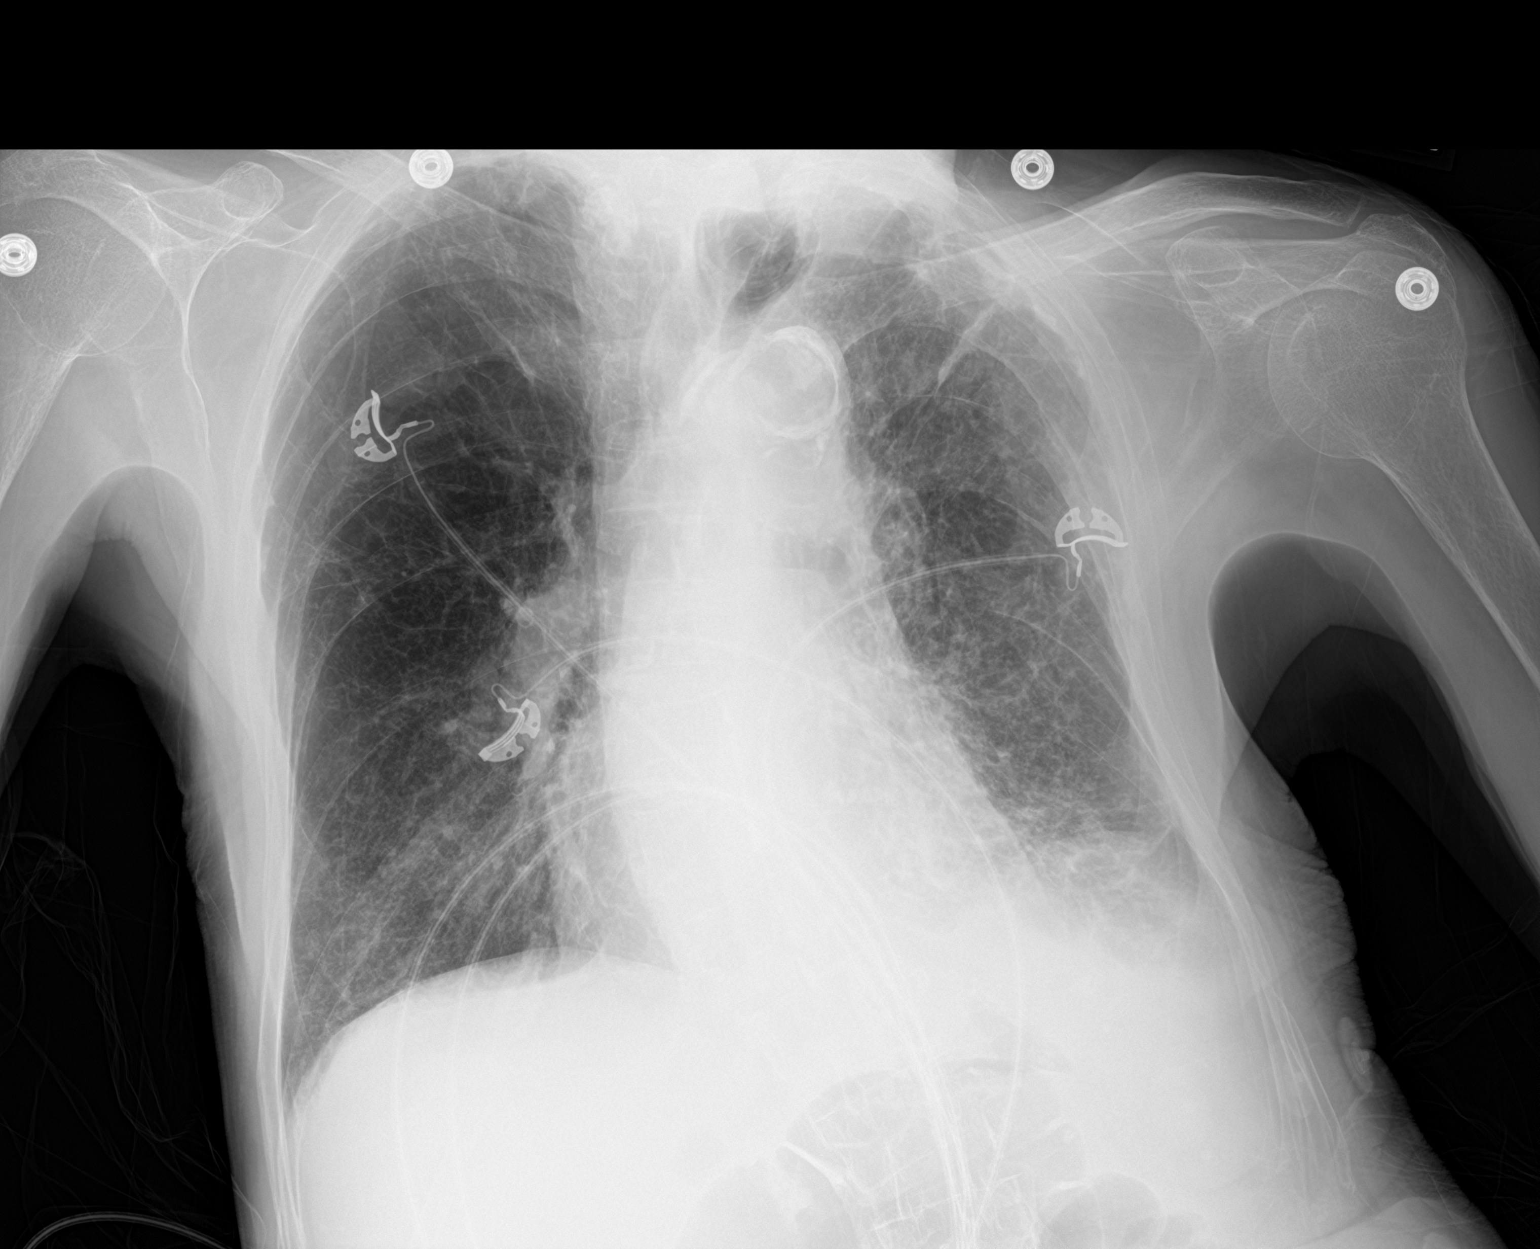

[2 of 2 positions shown; findings below may reference images not displayed]

FINDINGS: The heart size and mediastinal contours are within normal limits.
Emphysema, chronic scarring, and nodular airspace disease are
similar in appearance to prior CT and radiographs. Osteopenia with
an exaggerated thoracic kyphosis and a wedge deformity of
approximately T7 which is new when compared to prior radiographs and
CT.
IMPRESSION: 1. Emphysema, chronic scarring, and nodular airspace disease are
similar in appearance to prior CT and radiographs. There is no
obvious new airspace opacity.

2. Osteopenia with an exaggerated thoracic kyphosis and a wedge
deformity of approximately T7 which is new when compared to prior
radiographs and CT. Correlate for acute point tenderness.
